# Patient Record
Sex: Female | Born: 1955
Health system: Southern US, Community
[De-identification: ages and names within clinical notes are randomized; demographics above are authoritative.]

## PROBLEM LIST (undated history)

## (undated) DIAGNOSIS — K219 Gastro-esophageal reflux disease without esophagitis: Secondary | ICD-10-CM

## (undated) DIAGNOSIS — E785 Hyperlipidemia, unspecified: Secondary | ICD-10-CM

## (undated) DIAGNOSIS — G2581 Restless legs syndrome: Secondary | ICD-10-CM

## (undated) DIAGNOSIS — R3915 Urgency of urination: Secondary | ICD-10-CM

## (undated) DIAGNOSIS — F419 Anxiety disorder, unspecified: Secondary | ICD-10-CM

## (undated) DIAGNOSIS — E781 Pure hyperglyceridemia: Secondary | ICD-10-CM

## (undated) DIAGNOSIS — I10 Essential (primary) hypertension: Secondary | ICD-10-CM

## (undated) DIAGNOSIS — J40 Bronchitis, not specified as acute or chronic: Secondary | ICD-10-CM

## (undated) DIAGNOSIS — J42 Unspecified chronic bronchitis: Secondary | ICD-10-CM

## (undated) DIAGNOSIS — F329 Major depressive disorder, single episode, unspecified: Secondary | ICD-10-CM

## (undated) DIAGNOSIS — E119 Type 2 diabetes mellitus without complications: Secondary | ICD-10-CM

## (undated) DIAGNOSIS — M1711 Unilateral primary osteoarthritis, right knee: Secondary | ICD-10-CM

## (undated) DIAGNOSIS — Z8043 Family history of malignant neoplasm of testis: Secondary | ICD-10-CM

## (undated) DIAGNOSIS — Z8481 Family history of carrier of genetic disease: Secondary | ICD-10-CM

## (undated) DIAGNOSIS — R32 Unspecified urinary incontinence: Secondary | ICD-10-CM

## (undated) DIAGNOSIS — M1712 Unilateral primary osteoarthritis, left knee: Secondary | ICD-10-CM

## (undated) DIAGNOSIS — F32A Depression, unspecified: Secondary | ICD-10-CM

## (undated) DIAGNOSIS — B009 Herpesviral infection, unspecified: Secondary | ICD-10-CM

## (undated) DIAGNOSIS — E669 Obesity, unspecified: Secondary | ICD-10-CM

## (undated) DIAGNOSIS — Z803 Family history of malignant neoplasm of breast: Secondary | ICD-10-CM

## (undated) DIAGNOSIS — Z806 Family history of leukemia: Secondary | ICD-10-CM

## (undated) DIAGNOSIS — G4730 Sleep apnea, unspecified: Secondary | ICD-10-CM

## (undated) DIAGNOSIS — M1990 Unspecified osteoarthritis, unspecified site: Secondary | ICD-10-CM

## (undated) DIAGNOSIS — Z808 Family history of malignant neoplasm of other organs or systems: Secondary | ICD-10-CM

## (undated) DIAGNOSIS — J189 Pneumonia, unspecified organism: Secondary | ICD-10-CM

## (undated) HISTORY — DX: Family history of carrier of genetic disease: Z84.81

## (undated) HISTORY — DX: Family history of malignant neoplasm of breast: Z80.3

## (undated) HISTORY — DX: Gastro-esophageal reflux disease without esophagitis: K21.9

## (undated) HISTORY — DX: Essential (primary) hypertension: I10

## (undated) HISTORY — PX: BREAST BIOPSY: SHX20

## (undated) HISTORY — DX: Hyperlipidemia, unspecified: E78.5

## (undated) HISTORY — DX: Family history of malignant neoplasm of other organs or systems: Z80.8

## (undated) HISTORY — DX: Unspecified urinary incontinence: R32

## (undated) HISTORY — DX: Herpesviral infection, unspecified: B00.9

## (undated) HISTORY — DX: Obesity, unspecified: E66.9

## (undated) HISTORY — DX: Anxiety disorder, unspecified: F41.9

## (undated) HISTORY — PX: REFRACTIVE SURGERY: SHX103

## (undated) HISTORY — PX: KNEE ARTHROSCOPY: SHX127

## (undated) HISTORY — DX: Pure hyperglyceridemia: E78.1

## (undated) HISTORY — DX: Family history of malignant neoplasm of testis: Z80.43

## (undated) HISTORY — DX: Restless legs syndrome: G25.81

## (undated) HISTORY — PX: TONSILLECTOMY: SUR1361

## (undated) HISTORY — DX: Unilateral primary osteoarthritis, left knee: M17.12

## (undated) HISTORY — PX: COLONOSCOPY: SHX5424

## (undated) HISTORY — PX: NASAL SEPTUM SURGERY: SHX37

## (undated) HISTORY — DX: Family history of leukemia: Z80.6

---

## 1993-06-14 HISTORY — PX: HYSTEROSCOPY: SHX211

## 1993-06-14 HISTORY — PX: VAGINAL HYSTERECTOMY: SUR661

## 1999-02-18 LAB — HM PAP SMEAR

## 1999-04-06 ENCOUNTER — Encounter: Payer: Self-pay | Admitting: Obstetrics and Gynecology

## 1999-04-06 ENCOUNTER — Ambulatory Visit (HOSPITAL_COMMUNITY): Admission: RE | Admit: 1999-04-06 | Discharge: 1999-04-06 | Payer: Self-pay | Admitting: Obstetrics and Gynecology

## 2002-01-23 ENCOUNTER — Ambulatory Visit (HOSPITAL_BASED_OUTPATIENT_CLINIC_OR_DEPARTMENT_OTHER): Admission: RE | Admit: 2002-01-23 | Discharge: 2002-01-23 | Payer: Self-pay | Admitting: Family Medicine

## 2002-03-21 ENCOUNTER — Ambulatory Visit (HOSPITAL_BASED_OUTPATIENT_CLINIC_OR_DEPARTMENT_OTHER): Admission: RE | Admit: 2002-03-21 | Discharge: 2002-03-21 | Payer: Self-pay | Admitting: *Deleted

## 2002-06-12 ENCOUNTER — Ambulatory Visit (HOSPITAL_COMMUNITY): Admission: RE | Admit: 2002-06-12 | Discharge: 2002-06-13 | Payer: Self-pay | Admitting: *Deleted

## 2002-06-12 ENCOUNTER — Encounter (INDEPENDENT_AMBULATORY_CARE_PROVIDER_SITE_OTHER): Payer: Self-pay | Admitting: Specialist

## 2002-08-14 HISTORY — PX: UVULOPALATOPHARYNGOPLASTY, TONSILLECTOMY AND SEPTOPLASTY: SHX2632

## 2005-04-11 ENCOUNTER — Ambulatory Visit: Payer: Self-pay | Admitting: Internal Medicine

## 2005-04-18 ENCOUNTER — Ambulatory Visit: Payer: Self-pay | Admitting: Internal Medicine

## 2007-07-19 ENCOUNTER — Encounter: Admission: RE | Admit: 2007-07-19 | Discharge: 2007-07-19 | Payer: Self-pay | Admitting: Family Medicine

## 2008-08-14 HISTORY — PX: KNEE ARTHROSCOPY W/ MENISCECTOMY: SHX1879

## 2010-09-04 ENCOUNTER — Encounter: Payer: Self-pay | Admitting: Family Medicine

## 2010-12-30 NOTE — Op Note (Signed)
NAMEBYRON, Diane Gregory                            ACCOUNT NO.:  [id number]   MEDICAL RECORD NO.:  [id number]                   PATIENT TYPE:  OIB   LOCATION:  NA                                   FACILITY:  [facility]   PHYSICIAN:  Veverly Fells. Arletha Grippe, M.D.             DATE OF BIRTH:  February 14, 1956   DATE OF PROCEDURE:  06/12/2002  DATE OF DISCHARGE:                                 OPERATIVE REPORT   PREOPERATIVE DIAGNOSES:  1. Obstructive sleep apnea.  2. Nasal airway obstruction.  3. Nasal septal deviation.  4. Inferior turbinate hypertrophy.  5. Tonsillar hypertrophy.  6. Elongated soft palate and uvula.   POSTOPERATIVE DIAGNOSIS:  1. Obstructive sleep apnea.  2. Nasal airway obstruction.  3. Nasal septal deviation.  4. Inferior turbinate hypertrophy.  5. Tonsillar hypertrophy.  6. Elongated soft palate and uvula.   OPERATION:  1. Tonsillectomy.  2. Uvulopalatopharyngoplasty (both using harmonic scalpel).  3. Nasal septal reconstruction.  4. Cauterization both inferior turbinates.   SURGEON:  Veverly Fells. Arletha Grippe, M.D.   ANESTHESIA:  General endotracheal   INDICATIONS FOR PROCEDURE:  This is a 55 year old white female who has a  history of nasal airway obstruction that is not relieved with topical nasal  steroid spray.  She does also have a history of day time somnolence, morning  fatigue, snoring at night with documented apneic episodes.  Physical  examination did show moderate to severe septal deviation right with boggy  inferior turbinates, 1 to 2+ enlarged tonsils and elongated soft palate and  uvula.  Inpatient polysomnogram obtained did show a mild obstructive sleep  apnea with an RDI of 12 with desaturations down to 93%.  Based on her  history and physical examination, I have recommended proceeding with the  above noted surgical procedure.  I discussed extensively with her and her  family the risks and benefits of surgery including risk of general  anesthesia, infection  and bleeding and the normal recovery period to expect  after this type of surgery, including the possibility of uvulopharyngeal  insufficiency and the need for septal splinting.  I have entertained their  questions and answered them appropriately. Informed consent has been  obtained.   OPERATIVE FINDINGS:  Septal deviation to the right inferior turbinate,  congestion hypertrophy, moderate tonsillar hypertrophy, elongated soft  palate and uvula.   DESCRIPTION OF PROCEDURE:  The patient was brought to the operating room and  placed in the supine position.  General endotracheal anesthesia was  administered via the anesthesiologist without complications.  The patient  was administered 600 mg of Clindamycin IV times one and 10 mg of Decadron IV  times one.  The head of the table was turned 90 degrees.  The patient's face  was draped in the standard fashion.  A Crowe-Davis mouth retractor was  inserted into her oral cavity.  This was used to retract the mouth open.  First attention was turned to the right tonsil.  A curved Alice clamp was  used and the tonsil was retracted medially.  Harmonic scalpel was then used  to dissect the tonsil free from the tonsillar fossa.  Bleeding was  controlled with a combination of Harmonic scalpel and suction cautery  without difficulty.  The tonsil was then transected at the tongue base and  removed through the oral cavity and sent to surgical pathology for a  permanent section dialysis.  Next, attention was turned to the left tonsil.  Identical procedure was carried out on this side compared to the right side  with identical results.  After this was done, the uvula portion of the soft  palpate was transected horizontally using the Harmonic scalpel creating an  inferiorly based trap door flap.  This was done to leave enough soft palate  to prevent uvulopharyngeal insufficiency.  After this was done, bleeding  from the area was controlled meticulously with  suction cautery without  difficulty.  The oral cavity was then irrigated with copious amounts of  irrigation fluid and suctioned dry.  There was no evidence of any active  bleeding.  The palate, cut edges and anterior and posterior tonsillar  pillars were reapproximated with multiple interrupted 3-0 Vicryl suture and  approximately 4 cc of 0.25% Marcaine solution with 1:200,000 epinephrine  infiltrated into the anterior tonsillar pillar and tongue base bilaterally.  After this was done, the Crowe-Davis mouth retractor was released and  brought out through the oral cavity.  The head of the bed was elevated  approximately 30 degrees.  Cotton pledgets soaked in a 4% cocaine solution  were placed in both nares, left in place for approximately five to ten  minutes and then removed.  Both sides of the septum were infiltrated with 1%  Lidocaine solution with 1:100,000 epinephrine.  After awaiting approximately  ten minutes, a standard incision was made on the left septum.  Mucoperichondrial and mucoperiosteal flap was elevated on the left side  using both blunt and sharp dissection.  An intercartilaginous incision was  made approximately 1 cm posterior from the caudal line of the septum.  Mucoperichondrium and mucoperiosteal flap was elevated on the right side  using both blunt and sharp dissection.  Cartilaginous deviation was removed.  Posterior bony reflection was removed with a Jansen-Middleton forceps.  Septal spur which was pushing off the right nasal chamber was also removed  using a Jansen-Middleton forceps without difficulty.  A piece of trimmed  morselized cartilage was placed in between the septal flaps, septal incision  was closed with interrupted chromic suture and septum was reinforced with a  running transeptal plain gut mattress stitch.  Both inferior turbinates were  injected with a total of 6 cc of a 1% Lidocaine solution with a 1:100,000 epinephrine.  Both inferior turbinates  were then intramurally cauterized  using the Elmed intramural cauterization unit setting at 12 watt setting,  three passes of both inferior turbinates were performed without difficulty  and then both inferior turbinates were then outfractured using gentle  lateral pressure with large nasal septum.  Splint soaked in a Bactroban  ointment solution were placed on either side of the septum, held in place  with a Prolene suture.  Posterior pharynx was suctioned dry.  There was no  evidence of any active bleeding.  Orogastric tube was placed, this was used  to decompress the stomach contents.  It was then removed.  Fluids given  during the procedure were  approximately 800 cc of crystalloid.  Estimated  blood loss was less than 50 cc.  There were no drains.  The above noted two  splints were placed.  Specimen sent was septum cartilage and bone for gross  pathology only and tonsils x 2 and uvula portion of the soft palate.  The  patient tolerated the procedure well without complications, was extubated in  the operating room and transferred to recovery in stable condition.  Sponge,  instrument and needle counts were correct at the end of the procedure.  Total duration of the procedure is approximately two hours.  The patient  will be admitted for overnight recovery.  Once she recovers well, she will  be sent home on June 13, 2002.  She will be sent home on Biaxin elixir  500 mg p.o. b.i.d. for ten days, Vioxx 50 mg tablets one tablet p.o. q.d.  x10 and Lortab elixir 250 cc with two refills 10 to 15 cc p.o. q.4h. p.r.n.  pain.  She is to have light activity.  No heavy lifting or nose blowing for  two weeks after surgery.  Both her and her family were given oral and  written  instructions.  They are to call for any problems with bleeding, fever,  vomiting, pain, reaction to medications or any other questions.  She will  follow up in the office for postoperative check on Thursday, June 19, 2002  at 1:25 p.m.                                                Veverly Fells. Arletha Grippe, M.D.    MDR/MEDQ  D:  06/12/2002  T:  06/12/2002  Job:  [id number]   cc:   Tammy R. Collins [place], M.D.   Marcelyn Bruins, MD [facility]

## 2013-07-31 ENCOUNTER — Encounter: Payer: Self-pay | Admitting: Physician Assistant

## 2013-07-31 ENCOUNTER — Other Ambulatory Visit: Payer: Self-pay | Admitting: Physician Assistant

## 2013-07-31 NOTE — H&P (Signed)
TOTAL KNEE ADMISSION H&P  Patient is being admitted for left total knee arthroplasty.  Subjective:  Chief Complaint:left knee pain.  HPI: Diane Gregory, 57 y.o. female, has a history of pain and functional disability in the left knee due to arthritis and has failed non-surgical conservative treatments for greater than 12 weeks to includeNSAID's and/or analgesics, corticosteriod injections, viscosupplementation injections, flexibility and strengthening excercises, supervised PT with diminished ADL's post treatment, use of assistive devices, weight reduction as appropriate and activity modification.  Onset of symptoms was gradual, starting 10 years ago with gradually worsening course since that time. The patient noted no past surgery on the left knee(s).  Patient currently rates pain in the left knee(s) at 10 out of 10 with activity. Patient has night pain, worsening of pain with activity and weight bearing, pain that interferes with activities of daily living, crepitus and joint swelling.  Patient has evidence of subchondral sclerosis, periarticular osteophytes and joint space narrowing by imaging studies.  There is no active infection.  There are no active problems to display for this patient.  Past Medical History  Diagnosis Date  . Diabetes mellitus type 2, controlled, without complications   . Hypertension   . Restless leg syndrome   . Urinary incontinence   . Anxiety   . GERD (gastroesophageal reflux disease)   . Left knee DJD   . High triglycerides   . Hyperlipidemia     Past Surgical History  Procedure Laterality Date  . Vaginal hysterectomy  1994  . Uvulopalatopharyngoplasty, tonsillectomy and septoplasty  2004  . Knee arthroscopy w/ meniscectomy Right 2010     (Not in a hospital admission) Allergies  Allergen Reactions  . Celebrex [Celecoxib]   . Penicillins      Current outpatient prescriptions:atorvastatin (LIPITOR) 10 MG tablet, Take 10 mg by mouth daily., Disp: , Rfl:  ;  diltiazem (CARDIZEM CD) 240 MG 24 hr capsule, Take 240 mg by mouth daily., Disp: , Rfl: ;  fenofibrate 160 MG tablet, Take 160 mg by mouth daily., Disp: , Rfl: ;  losartan-hydrochlorothiazide (HYZAAR) 100-25 MG per tablet, Take 1 tablet by mouth daily., Disp: , Rfl:  metFORMIN (GLUCOPHAGE) 500 MG tablet, Take 500 mg by mouth 2 (two) times daily with a meal., Disp: , Rfl: ;  Multiple Vitamin (MULTIVITAMIN WITH MINERALS) TABS tablet, Take 1 tablet by mouth daily., Disp: , Rfl: ;  omeprazole (PRILOSEC) 20 MG capsule, Take 20 mg by mouth daily., Disp: , Rfl: ;  PARoxetine (PAXIL) 40 MG tablet, Take 40 mg by mouth every morning., Disp: , Rfl:  pramipexole (MIRAPEX) 1 MG tablet, Take 1 mg by mouth at bedtime., Disp: , Rfl: ;  solifenacin (VESICARE) 10 MG tablet, Take 10 mg by mouth daily., Disp: , Rfl:     History  Substance Use Topics  . Smoking status: Never Smoker   . Smokeless tobacco: Not on file  . Alcohol Use: Yes     Comment: very little    Family History  Problem Relation Age of Onset  . Heart disease Mother   . Hypertension Mother   . Hypertension Brother   . Diabetes Mellitus II Father   . Stroke Mother   . Cancer - Other Mother   . Cancer Father      Review of Systems  Constitutional: Negative.   HENT: Negative.   Eyes: Negative.   Respiratory: Negative.   Cardiovascular: Negative.   Gastrointestinal: Negative.   Genitourinary: Negative.   Musculoskeletal: Positive for joint pain.  Bilateral knee pain left worse than right  Skin: Negative.   Neurological: Negative.   Endo/Heme/Allergies: Negative.   Psychiatric/Behavioral: Negative.     Objective:  Physical Exam  Constitutional: She is oriented to person, place, and time. She appears well-developed and well-nourished.  HENT:  Head: Normocephalic and atraumatic.  Mouth/Throat: Oropharynx is clear and moist.  Eyes: Conjunctivae and EOM are normal.  Neck: Normal range of motion.  Cardiovascular: Normal rate  and regular rhythm.  Exam reveals no gallop and no friction rub.   No murmur heard. Respiratory: Effort normal and breath sounds normal. No respiratory distress. She has no wheezes. She has no rales. She exhibits no tenderness.  Low 02 sat will recheck once done with prednisone and nail polish off  GI: Soft. Bowel sounds are normal.  Genitourinary:  Not pertinent to current symptomatology therefore not examined.  Musculoskeletal:  She is independently ambulatory with a moderately antalgic gait.  She has active range of motion -5 to 125 degrees bilaterally, 2+ crepitus bilaterally.  1+ synovitis bilaterally.  Medial joint line tenderness bilaterally.    Neurological: She is alert and oriented to person, place, and time.  Skin: Skin is warm and dry.  Psychiatric: She has a normal mood and affect. Her behavior is normal.    Vital signs in last 24 hours: Last recorded: 12/18 1100   BP: 115/72 Pulse: 82  Temp: 97.6 F (36.4 C)    Height: 5\' 3"  (1.6 m) SpO2: 91  Weight: 107.502 kg (237 lb)     Labs:   Estimated body mass index is 41.99 kg/(m^2) as calculated from the following:   Height as of this encounter: 5\' 3"  (1.6 m).   Weight as of this encounter: 107.502 kg (237 lb).   Imaging Review Plain radiographs demonstrate severe degenerative joint disease of the left knee(s). The overall alignment issignificant varus. The bone quality appears to be good for age and reported activity level.  Assessment/Plan:  End stage arthritis, left knee   The patient history, physical examination, clinical judgment of the provider and imaging studies are consistent with end stage degenerative joint disease of the left knee(s) and total knee arthroplasty is deemed medically necessary. The treatment options including medical management, injection therapy arthroscopy and arthroplasty were discussed at length. The risks and benefits of total knee arthroplasty were presented and reviewed. The risks due  to aseptic loosening, infection, stiffness, patella tracking problems, thromboembolic complications and other imponderables were discussed. The patient acknowledged the explanation, agreed to proceed with the plan and consent was signed. Patient is being admitted for inpatient treatment for surgery, pain control, PT, OT, prophylactic antibiotics, VTE prophylaxis, progressive ambulation and ADL's and discharge planning. The patient is planning to be discharged home with home health services and her daughter  Diane Gregory A. Gwinda Passe Physician Assistant [facility] [phone]  07/31/2013, 1:19 PM

## 2013-08-01 ENCOUNTER — Encounter (HOSPITAL_COMMUNITY): Payer: Self-pay | Admitting: Pharmacy Technician

## 2013-08-04 ENCOUNTER — Ambulatory Visit (HOSPITAL_COMMUNITY)
Admission: RE | Admit: 2013-08-04 | Discharge: 2013-08-04 | Disposition: A | Discharge status: Home / Self Care | Payer: 59 | Source: Ambulatory Visit | Attending: Physician Assistant | Admitting: Physician Assistant

## 2013-08-04 ENCOUNTER — Encounter (HOSPITAL_COMMUNITY)
Admission: RE | Admit: 2013-08-04 | Discharge: 2013-08-04 | Disposition: A | Discharge status: Home / Self Care | Payer: 59 | Source: Ambulatory Visit | Attending: Orthopedic Surgery | Admitting: Orthopedic Surgery

## 2013-08-04 ENCOUNTER — Encounter (HOSPITAL_COMMUNITY): Payer: Self-pay

## 2013-08-04 DIAGNOSIS — Z01818 Encounter for other preprocedural examination: Secondary | ICD-10-CM | POA: Insufficient documentation

## 2013-08-04 DIAGNOSIS — Z0183 Encounter for blood typing: Secondary | ICD-10-CM | POA: Insufficient documentation

## 2013-08-04 DIAGNOSIS — M1710 Unilateral primary osteoarthritis, unspecified knee: Secondary | ICD-10-CM | POA: Insufficient documentation

## 2013-08-04 DIAGNOSIS — I10 Essential (primary) hypertension: Secondary | ICD-10-CM | POA: Insufficient documentation

## 2013-08-04 DIAGNOSIS — Z01812 Encounter for preprocedural laboratory examination: Secondary | ICD-10-CM | POA: Insufficient documentation

## 2013-08-04 HISTORY — DX: Sleep apnea, unspecified: G47.30

## 2013-08-04 LAB — COMPREHENSIVE METABOLIC PANEL
ALT: 38 U/L — ABNORMAL HIGH (ref 0–35)
AST: 23 U/L (ref 0–37)
Albumin: 4.1 g/dL (ref 3.5–5.2)
Alkaline Phosphatase: 93 U/L (ref 39–117)
BUN: 22 mg/dL (ref 6–23)
CO2: 25 mEq/L (ref 19–32)
Calcium: 9.5 mg/dL (ref 8.4–10.5)
Chloride: 95 mEq/L — ABNORMAL LOW (ref 96–112)
Creatinine, Ser: 0.73 mg/dL (ref 0.50–1.10)
GFR calc Af Amer: >90 mL/min (ref >90)
GFR calc non Af Amer: >90 mL/min (ref >90)
Glucose, Bld: 234 mg/dL — ABNORMAL HIGH (ref 70–99)
Potassium: 3.7 mEq/L (ref 3.5–5.1)
Sodium: 135 mEq/L (ref 135–145)
Total Bilirubin: 0.3 mg/dL (ref 0.3–1.2)
Total Protein: 7.7 g/dL (ref 6.0–8.3)

## 2013-08-04 LAB — CBC WITH DIFFERENTIAL/PLATELET
Eosinophils Absolute: 0.1 10*3/uL (ref 0.0–0.7)
HCT: 42.9 % (ref 36.0–46.0)
Hemoglobin: 14.2 g/dL (ref 12.0–15.0)
Lymphocytes Relative: 20 % (ref 12–46)
Lymphs Abs: 3.1 10*3/uL (ref 0.7–4.0)
MCHC: 33.1 g/dL (ref 30.0–36.0)
Monocytes Absolute: 1 10*3/uL (ref 0.1–1.0)
Monocytes Relative: 7 % (ref 3–12)
Neutro Abs: 11.5 10*3/uL — ABNORMAL HIGH (ref 1.7–7.7)
Neutrophils Relative %: 73 % (ref 43–77)
RBC: 5.12 MIL/uL — ABNORMAL HIGH (ref 3.87–5.11)
RDW: 13.2 % (ref 11.5–15.5)

## 2013-08-04 LAB — TYPE AND SCREEN
ABO/RH(D): O POS
Antibody Screen: NEGATIVE

## 2013-08-04 LAB — URINALYSIS, ROUTINE W REFLEX MICROSCOPIC
Bilirubin Urine: NEGATIVE
Glucose, UA: 250 mg/dL — AB
Leukocytes, UA: NEGATIVE
Nitrite: NEGATIVE
Specific Gravity, Urine: 1.018 (ref 1.005–1.030)
Urobilinogen, UA: 0.2 mg/dL (ref 0.0–1.0)
pH: 5 (ref 5.0–8.0)

## 2013-08-04 LAB — APTT: aPTT: 27 seconds (ref 24–37)

## 2013-08-04 LAB — PROTIME-INR
INR: 0.97 (ref 0.00–1.49)
Prothrombin Time: 12.7 seconds (ref 11.6–15.2)

## 2013-08-04 LAB — ABO/RH: ABO/RH(D): O POS

## 2013-08-04 LAB — SURGICAL PCR SCREEN: MRSA, PCR: NEGATIVE

## 2013-08-04 NOTE — Progress Notes (Addendum)
Pt not here for interview called and did interview over the phone.  Pt states that she could be by around 2pm for lab work and test.   Denies having a card cath. States that she had an echo and stress test at [facility]. Request to be sent for results. PCP is Dr Herb Grays

## 2013-08-04 NOTE — Progress Notes (Signed)
Pt here for labs consent signed and info review with pt voices understanding.

## 2013-08-04 NOTE — Pre-Procedure Instructions (Signed)
Diane Gregory  08/04/2013   Your procedure is scheduled on:  Dec 29 at 900  Report to [facility]  2 * 3 at 0700 AM.  Call this number if you have problems the morning of surgery: [phone]   Remember:   Do not eat food or drink liquids after midnight.   Take these medicines the morning of surgery with A SIP OF WATER: Cardizem, Prilosec, Paxil  Stop taking Aspirin, Aleve, Ibuprofen, BC's, Goody's, Herbal medications, and Fish Oil   Do not wear jewelry, make-up or nail polish.  Do not wear lotions, powders, or perfumes. You may wear deodorant.  Do not shave 48 hours prior to surgery.  Do not bring valuables to the hospital.  [facility] is not responsible                  for any belongings or valuables.               Contacts, dentures or bridgework may not be worn into surgery.  Leave suitcase in the car. After surgery it may be brought to your room.  For patients admitted to the hospital, discharge time is determined by your                treatment team.               Patients discharged the day of surgery will not be allowed to drive  home.    Special Instructions: Shower using CHG 2 nights before surgery and the night before surgery.  If you shower the day of surgery use CHG.  Use special wash - you have one bottle of CHG for all showers.  You should use approximately 1/3 of the bottle for each shower.   Please read over the following fact sheets that you were given: Pain Booklet, Coughing and Deep Breathing, Blood Transfusion Information, MRSA Information and Surgical Site Infection Prevention

## 2013-08-05 ENCOUNTER — Other Ambulatory Visit: Payer: Self-pay | Admitting: Physician Assistant

## 2013-08-05 LAB — URINE CULTURE: Colony Count: 50000

## 2013-08-08 ENCOUNTER — Encounter (HOSPITAL_COMMUNITY)
Admission: RE | Admit: 2013-08-08 | Discharge: 2013-08-08 | Disposition: A | Discharge status: Home / Self Care | Payer: 59 | Source: Ambulatory Visit | Attending: Orthopedic Surgery | Admitting: Orthopedic Surgery

## 2013-08-08 LAB — CBC WITH DIFFERENTIAL/PLATELET
Basophils Relative: 0 % (ref 0–1)
Eosinophils Absolute: 0.2 10*3/uL (ref 0.0–0.7)
HCT: 39.8 % (ref 36.0–46.0)
Hemoglobin: 13.2 g/dL (ref 12.0–15.0)
MCH: 28 pg (ref 26.0–34.0)
MCHC: 33.2 g/dL (ref 30.0–36.0)
Monocytes Absolute: 1.1 10*3/uL — ABNORMAL HIGH (ref 0.1–1.0)
Monocytes Relative: 11 % (ref 3–12)
RDW: 13.4 % (ref 11.5–15.5)

## 2013-08-10 MED ORDER — CHLORHEXIDINE GLUCONATE 4 % EX LIQD: 60.0000 mL | Freq: Once | CUTANEOUS | Status: DC

## 2013-08-10 MED ORDER — VANCOMYCIN HCL 10 G IV SOLR
1500.0000 mg | INTRAVENOUS | Status: AC
  Administered 2013-08-11: 1500 mg via INTRAVENOUS
  Filled 2013-08-10 (×2): qty 1500

## 2013-08-10 MED ORDER — TRANEXAMIC ACID 100 MG/ML IV SOLN
1000.0000 mg | INTRAVENOUS | Status: AC
  Administered 2013-08-11: 1000 mg via INTRAVENOUS
  Filled 2013-08-10: qty 10

## 2013-08-10 MED ORDER — POVIDONE-IODINE 7.5 % EX SOLN
Freq: Once | CUTANEOUS | Status: DC
  Filled 2013-08-10: qty 118

## 2013-08-10 MED ORDER — LACTATED RINGERS IV SOLN
INTRAVENOUS | Status: DC
  Administered 2013-08-11: 08:00:00 via INTRAVENOUS

## 2013-08-11 ENCOUNTER — Inpatient Hospital Stay (HOSPITAL_COMMUNITY)
Admission: RE | Admit: 2013-08-11 | Discharge: 2013-08-12 | DRG: 470 | Disposition: A | Discharge status: Home Health | Payer: 59 | Source: Ambulatory Visit | Attending: Orthopedic Surgery | Admitting: Orthopedic Surgery

## 2013-08-11 ENCOUNTER — Encounter (HOSPITAL_COMMUNITY): Payer: 59 | Admitting: Anesthesiology

## 2013-08-11 ENCOUNTER — Ambulatory Visit (HOSPITAL_COMMUNITY): Payer: 59 | Admitting: Anesthesiology

## 2013-08-11 ENCOUNTER — Encounter (HOSPITAL_COMMUNITY)
Admission: RE | Disposition: A | Discharge status: Home Health | Payer: Self-pay | Source: Ambulatory Visit | Attending: Orthopedic Surgery

## 2013-08-11 ENCOUNTER — Encounter (HOSPITAL_COMMUNITY): Payer: Self-pay | Admitting: *Deleted

## 2013-08-11 DIAGNOSIS — I10 Essential (primary) hypertension: Secondary | ICD-10-CM | POA: Diagnosis present

## 2013-08-11 DIAGNOSIS — Z823 Family history of stroke: Secondary | ICD-10-CM

## 2013-08-11 DIAGNOSIS — M179 Osteoarthritis of knee, unspecified: Secondary | ICD-10-CM | POA: Diagnosis present

## 2013-08-11 DIAGNOSIS — Z889 Allergy status to unspecified drugs, medicaments and biological substances status: Secondary | ICD-10-CM

## 2013-08-11 DIAGNOSIS — Z809 Family history of malignant neoplasm, unspecified: Secondary | ICD-10-CM

## 2013-08-11 DIAGNOSIS — Z8249 Family history of ischemic heart disease and other diseases of the circulatory system: Secondary | ICD-10-CM

## 2013-08-11 DIAGNOSIS — K219 Gastro-esophageal reflux disease without esophagitis: Secondary | ICD-10-CM | POA: Diagnosis present

## 2013-08-11 DIAGNOSIS — Z01812 Encounter for preprocedural laboratory examination: Secondary | ICD-10-CM

## 2013-08-11 DIAGNOSIS — M1710 Unilateral primary osteoarthritis, unspecified knee: Principal | ICD-10-CM | POA: Diagnosis present

## 2013-08-11 DIAGNOSIS — G4730 Sleep apnea, unspecified: Secondary | ICD-10-CM | POA: Diagnosis present

## 2013-08-11 DIAGNOSIS — E781 Pure hyperglyceridemia: Secondary | ICD-10-CM | POA: Diagnosis present

## 2013-08-11 DIAGNOSIS — E785 Hyperlipidemia, unspecified: Secondary | ICD-10-CM | POA: Diagnosis present

## 2013-08-11 DIAGNOSIS — E119 Type 2 diabetes mellitus without complications: Secondary | ICD-10-CM | POA: Diagnosis present

## 2013-08-11 DIAGNOSIS — M1712 Unilateral primary osteoarthritis, left knee: Secondary | ICD-10-CM | POA: Diagnosis present

## 2013-08-11 DIAGNOSIS — Z79899 Other long term (current) drug therapy: Secondary | ICD-10-CM

## 2013-08-11 DIAGNOSIS — Z880 Allergy status to penicillin: Secondary | ICD-10-CM

## 2013-08-11 DIAGNOSIS — F411 Generalized anxiety disorder: Secondary | ICD-10-CM | POA: Diagnosis present

## 2013-08-11 DIAGNOSIS — G2581 Restless legs syndrome: Secondary | ICD-10-CM | POA: Diagnosis present

## 2013-08-11 DIAGNOSIS — Z833 Family history of diabetes mellitus: Secondary | ICD-10-CM

## 2013-08-11 HISTORY — PX: TOTAL KNEE ARTHROPLASTY: SHX125

## 2013-08-11 LAB — GLUCOSE, CAPILLARY
Glucose-Capillary: 154 mg/dL — ABNORMAL HIGH (ref 70–99)
Glucose-Capillary: 180 mg/dL — ABNORMAL HIGH (ref 70–99)

## 2013-08-11 SURGERY — ARTHROPLASTY, KNEE, TOTAL
Anesthesia: Regional | Site: Knee | Laterality: Left

## 2013-08-11 MED ORDER — BUPIVACAINE-EPINEPHRINE 0.25% -1:200000 IJ SOLN
INTRAMUSCULAR | Status: DC | PRN
  Administered 2013-08-11: 30 mL

## 2013-08-11 MED ORDER — MENTHOL 3 MG MT LOZG: 1.0000 | LOZENGE | OROMUCOSAL | Status: DC | PRN

## 2013-08-11 MED ORDER — ADULT MULTIVITAMIN W/MINERALS CH
1.0000 | ORAL_TABLET | Freq: Every day | ORAL | Status: DC
  Administered 2013-08-11 – 2013-08-12 (×2): 1 via ORAL
  Filled 2013-08-11 (×2): qty 1

## 2013-08-11 MED ORDER — DEXAMETHASONE SODIUM PHOSPHATE 10 MG/ML IJ SOLN
INTRAMUSCULAR | Status: DC | PRN
  Administered 2013-08-11: 10 mg via INTRAVENOUS

## 2013-08-11 MED ORDER — PRAMIPEXOLE DIHYDROCHLORIDE 1 MG PO TABS
1.0000 mg | ORAL_TABLET | Freq: Every day | ORAL | Status: DC
  Administered 2013-08-11: 1 mg via ORAL
  Filled 2013-08-11 (×2): qty 1

## 2013-08-11 MED ORDER — PANTOPRAZOLE SODIUM 40 MG PO TBEC
40.0000 mg | DELAYED_RELEASE_TABLET | Freq: Every day | ORAL | Status: DC
  Administered 2013-08-11: 40 mg via ORAL
  Filled 2013-08-11: qty 1

## 2013-08-11 MED ORDER — PHENOL 1.4 % MT LIQD: 1.0000 | OROMUCOSAL | Status: DC | PRN

## 2013-08-11 MED ORDER — INSULIN ASPART 100 UNIT/ML ~~LOC~~ SOLN
0.0000 [IU] | Freq: Three times a day (TID) | SUBCUTANEOUS | Status: DC
  Administered 2013-08-11: 3 [IU] via SUBCUTANEOUS
  Administered 2013-08-12 (×2): 5 [IU] via SUBCUTANEOUS

## 2013-08-11 MED ORDER — METOCLOPRAMIDE HCL 5 MG/ML IJ SOLN: 5.0000 mg | Freq: Three times a day (TID) | INTRAMUSCULAR | Status: DC | PRN

## 2013-08-11 MED ORDER — METOCLOPRAMIDE HCL 5 MG PO TABS
5.0000 mg | ORAL_TABLET | Freq: Three times a day (TID) | ORAL | Status: DC | PRN
  Filled 2013-08-11: qty 2

## 2013-08-11 MED ORDER — ONDANSETRON HCL 4 MG PO TABS: 4.0000 mg | ORAL_TABLET | Freq: Four times a day (QID) | ORAL | Status: DC | PRN

## 2013-08-11 MED ORDER — PROPOFOL 10 MG/ML IV BOLUS
INTRAVENOUS | Status: DC | PRN
  Administered 2013-08-11: 170 mg via INTRAVENOUS

## 2013-08-11 MED ORDER — ZOLPIDEM TARTRATE 5 MG PO TABS: 5.0000 mg | ORAL_TABLET | Freq: Every evening | ORAL | Status: DC | PRN

## 2013-08-11 MED ORDER — ACETAMINOPHEN 325 MG PO TABS: 650.0000 mg | ORAL_TABLET | Freq: Four times a day (QID) | ORAL | Status: DC | PRN

## 2013-08-11 MED ORDER — FENTANYL CITRATE 0.05 MG/ML IJ SOLN
INTRAMUSCULAR | Status: AC
  Administered 2013-08-11: 100 ug
  Filled 2013-08-11: qty 2

## 2013-08-11 MED ORDER — NEOSTIGMINE METHYLSULFATE 1 MG/ML IJ SOLN
INTRAMUSCULAR | Status: DC | PRN
  Administered 2013-08-11: 3 mg via INTRAVENOUS

## 2013-08-11 MED ORDER — DOCUSATE SODIUM 100 MG PO CAPS
100.0000 mg | ORAL_CAPSULE | Freq: Two times a day (BID) | ORAL | Status: DC
  Administered 2013-08-11 – 2013-08-12 (×2): 100 mg via ORAL
  Filled 2013-08-11 (×2): qty 1

## 2013-08-11 MED ORDER — MIDAZOLAM HCL 5 MG/ML IJ SOLN: 2.0000 mg | Freq: Once | INTRAMUSCULAR | Status: DC

## 2013-08-11 MED ORDER — ONDANSETRON HCL 4 MG/2ML IJ SOLN: 4.0000 mg | Freq: Four times a day (QID) | INTRAMUSCULAR | Status: DC | PRN

## 2013-08-11 MED ORDER — FENOFIBRATE 160 MG PO TABS
160.0000 mg | ORAL_TABLET | Freq: Every day | ORAL | Status: DC
  Administered 2013-08-11 – 2013-08-12 (×2): 160 mg via ORAL
  Filled 2013-08-11 (×2): qty 1

## 2013-08-11 MED ORDER — SODIUM CHLORIDE 0.9 % IR SOLN
Status: DC | PRN
  Administered 2013-08-11: 1000 mL

## 2013-08-11 MED ORDER — SODIUM CHLORIDE 0.9 % IR SOLN
Status: DC | PRN
  Administered 2013-08-11: 3000 mL

## 2013-08-11 MED ORDER — BUPIVACAINE-EPINEPHRINE (PF) 0.25% -1:200000 IJ SOLN
INTRAMUSCULAR | Status: AC
  Filled 2013-08-11: qty 30

## 2013-08-11 MED ORDER — KETOROLAC TROMETHAMINE 15 MG/ML IJ SOLN
15.0000 mg | Freq: Four times a day (QID) | INTRAMUSCULAR | Status: DC
  Administered 2013-08-11 – 2013-08-12 (×3): 15 mg via INTRAVENOUS
  Filled 2013-08-11 (×4): qty 1

## 2013-08-11 MED ORDER — HYDROMORPHONE HCL PF 1 MG/ML IJ SOLN
1.0000 mg | INTRAMUSCULAR | Status: DC | PRN
  Administered 2013-08-11: 1 mg via INTRAVENOUS
  Filled 2013-08-11: qty 1

## 2013-08-11 MED ORDER — DEXAMETHASONE 6 MG PO TABS
10.0000 mg | ORAL_TABLET | Freq: Three times a day (TID) | ORAL | Status: AC
  Administered 2013-08-11 – 2013-08-12 (×2): 10 mg via ORAL
  Filled 2013-08-11 (×3): qty 1

## 2013-08-11 MED ORDER — HYDROMORPHONE HCL PF 1 MG/ML IJ SOLN
0.2500 mg | INTRAMUSCULAR | Status: DC | PRN
  Administered 2013-08-11 (×2): 0.5 mg via INTRAVENOUS

## 2013-08-11 MED ORDER — PAROXETINE HCL 20 MG PO TABS
40.0000 mg | ORAL_TABLET | ORAL | Status: DC
  Administered 2013-08-12: 40 mg via ORAL
  Filled 2013-08-11 (×2): qty 2

## 2013-08-11 MED ORDER — DARIFENACIN HYDROBROMIDE ER 15 MG PO TB24
15.0000 mg | ORAL_TABLET | Freq: Every day | ORAL | Status: DC
  Administered 2013-08-11 – 2013-08-12 (×2): 15 mg via ORAL
  Filled 2013-08-11 (×2): qty 1

## 2013-08-11 MED ORDER — ATORVASTATIN CALCIUM 10 MG PO TABS
10.0000 mg | ORAL_TABLET | Freq: Every day | ORAL | Status: DC
  Administered 2013-08-11: 10 mg via ORAL
  Filled 2013-08-11 (×2): qty 1

## 2013-08-11 MED ORDER — LACTATED RINGERS IV SOLN
INTRAVENOUS | Status: DC | PRN
  Administered 2013-08-11 (×2): via INTRAVENOUS

## 2013-08-11 MED ORDER — FENTANYL CITRATE 0.05 MG/ML IJ SOLN
INTRAMUSCULAR | Status: DC | PRN
  Administered 2013-08-11 (×5): 50 ug via INTRAVENOUS

## 2013-08-11 MED ORDER — VANCOMYCIN HCL IN DEXTROSE 1-5 GM/200ML-% IV SOLN
1000.0000 mg | Freq: Two times a day (BID) | INTRAVENOUS | Status: AC
  Administered 2013-08-11: 1000 mg via INTRAVENOUS
  Filled 2013-08-11: qty 200

## 2013-08-11 MED ORDER — ACETAMINOPHEN 650 MG RE SUPP: 650.0000 mg | Freq: Four times a day (QID) | RECTAL | Status: DC | PRN

## 2013-08-11 MED ORDER — DIPHENHYDRAMINE HCL 12.5 MG/5ML PO ELIX: 12.5000 mg | ORAL_SOLUTION | ORAL | Status: DC | PRN

## 2013-08-11 MED ORDER — OXYCODONE HCL 5 MG PO TABS
5.0000 mg | ORAL_TABLET | ORAL | Status: DC | PRN
  Administered 2013-08-11: 10 mg via ORAL
  Administered 2013-08-11: 5 mg via ORAL
  Administered 2013-08-12: 10 mg via ORAL
  Filled 2013-08-11 (×2): qty 2

## 2013-08-11 MED ORDER — POTASSIUM CHLORIDE IN NACL 20-0.9 MEQ/L-% IV SOLN
INTRAVENOUS | Status: DC
  Administered 2013-08-11 – 2013-08-12 (×2): via INTRAVENOUS
  Filled 2013-08-11 (×5): qty 1000

## 2013-08-11 MED ORDER — ONDANSETRON HCL 4 MG/2ML IJ SOLN
INTRAMUSCULAR | Status: DC | PRN
  Administered 2013-08-11: 4 mg via INTRAVENOUS

## 2013-08-11 MED ORDER — GLYCOPYRROLATE 0.2 MG/ML IJ SOLN
INTRAMUSCULAR | Status: DC | PRN
  Administered 2013-08-11: 0.6 mg via INTRAVENOUS

## 2013-08-11 MED ORDER — ROCURONIUM BROMIDE 100 MG/10ML IV SOLN
INTRAVENOUS | Status: DC | PRN
  Administered 2013-08-11: 40 mg via INTRAVENOUS

## 2013-08-11 MED ORDER — INSULIN ASPART 100 UNIT/ML ~~LOC~~ SOLN
6.0000 [IU] | Freq: Three times a day (TID) | SUBCUTANEOUS | Status: DC
  Administered 2013-08-12: 6 [IU] via SUBCUTANEOUS
  Administered 2013-08-12: 09:00:00 via SUBCUTANEOUS

## 2013-08-11 MED ORDER — HYDROMORPHONE HCL PF 1 MG/ML IJ SOLN
INTRAMUSCULAR | Status: AC
  Administered 2013-08-11: 0.5 mg via INTRAVENOUS
  Filled 2013-08-11: qty 1

## 2013-08-11 MED ORDER — INSULIN ASPART 100 UNIT/ML ~~LOC~~ SOLN
0.0000 [IU] | Freq: Every day | SUBCUTANEOUS | Status: DC
  Administered 2013-08-11: 2 [IU] via SUBCUTANEOUS

## 2013-08-11 MED ORDER — MIDAZOLAM HCL 2 MG/2ML IJ SOLN
INTRAMUSCULAR | Status: AC
  Administered 2013-08-11: 2 mg
  Filled 2013-08-11: qty 2

## 2013-08-11 MED ORDER — LIDOCAINE HCL (CARDIAC) 20 MG/ML IV SOLN
INTRAVENOUS | Status: DC | PRN
  Administered 2013-08-11: 70 mg via INTRAVENOUS

## 2013-08-11 MED ORDER — LOSARTAN POTASSIUM-HCTZ 100-25 MG PO TABS: 1.0000 | ORAL_TABLET | Freq: Every day | ORAL | Status: DC

## 2013-08-11 MED ORDER — ASPIRIN EC 325 MG PO TBEC
325.0000 mg | DELAYED_RELEASE_TABLET | Freq: Every day | ORAL | Status: DC
  Administered 2013-08-12: 325 mg via ORAL
  Filled 2013-08-11 (×2): qty 1

## 2013-08-11 MED ORDER — HYDROMORPHONE HCL PF 1 MG/ML IJ SOLN: 0.2500 mg | INTRAMUSCULAR | Status: DC | PRN

## 2013-08-11 MED ORDER — ALUM & MAG HYDROXIDE-SIMETH 200-200-20 MG/5ML PO SUSP: 30.0000 mL | ORAL | Status: DC | PRN

## 2013-08-11 MED ORDER — FENTANYL CITRATE 0.05 MG/ML IJ SOLN: 100.0000 ug | Freq: Once | INTRAMUSCULAR | Status: DC

## 2013-08-11 MED ORDER — OXYCODONE HCL 5 MG PO TABS
ORAL_TABLET | ORAL | Status: AC
  Filled 2013-08-11: qty 1

## 2013-08-11 MED ORDER — DEXAMETHASONE SODIUM PHOSPHATE 10 MG/ML IJ SOLN
10.0000 mg | Freq: Three times a day (TID) | INTRAMUSCULAR | Status: AC
  Administered 2013-08-11: 10 mg via INTRAVENOUS
  Filled 2013-08-11 (×3): qty 1

## 2013-08-11 MED ORDER — BISACODYL 5 MG PO TBEC
10.0000 mg | DELAYED_RELEASE_TABLET | Freq: Every day | ORAL | Status: DC
  Administered 2013-08-11: 10 mg via ORAL
  Filled 2013-08-11: qty 2

## 2013-08-11 MED ORDER — HYDROCHLOROTHIAZIDE 25 MG PO TABS
25.0000 mg | ORAL_TABLET | Freq: Every day | ORAL | Status: DC
  Administered 2013-08-11 – 2013-08-12 (×2): 25 mg via ORAL
  Filled 2013-08-11 (×2): qty 1

## 2013-08-11 MED ORDER — LOSARTAN POTASSIUM 50 MG PO TABS
100.0000 mg | ORAL_TABLET | Freq: Every day | ORAL | Status: DC
  Administered 2013-08-11 – 2013-08-12 (×2): 100 mg via ORAL
  Filled 2013-08-11 (×2): qty 2

## 2013-08-11 MED ORDER — DILTIAZEM HCL ER COATED BEADS 240 MG PO CP24
240.0000 mg | ORAL_CAPSULE | Freq: Every day | ORAL | Status: DC
  Administered 2013-08-12: 240 mg via ORAL
  Filled 2013-08-11 (×2): qty 1

## 2013-08-11 SURGICAL SUPPLY — 66 items
BANDAGE ESMARK 6X9 LF (GAUZE/BANDAGES/DRESSINGS) ×1 IMPLANT
BLADE SAGITTAL 25.0X1.19X90 (BLADE) ×2 IMPLANT
BLADE SAW SGTL 11.0X1.19X90.0M (BLADE) IMPLANT
BLADE SAW SGTL 13.0X1.19X90.0M (BLADE) ×2 IMPLANT
BLADE SURG 10 STRL SS (BLADE) ×4 IMPLANT
BNDG ELASTIC 6X15 VLCR STRL LF (GAUZE/BANDAGES/DRESSINGS) ×2 IMPLANT
BNDG ESMARK 6X9 LF (GAUZE/BANDAGES/DRESSINGS) ×2
BOWL SMART MIX CTS (DISPOSABLE) ×2 IMPLANT
CAPT RP KNEE ×2 IMPLANT
CEMENT HV SMART SET (Cement) ×4 IMPLANT
CLOTH BEACON ORANGE TIMEOUT ST (SAFETY) IMPLANT
COVER SURGICAL LIGHT HANDLE (MISCELLANEOUS) ×2 IMPLANT
CUFF TOURNIQUET SINGLE 34IN LL (TOURNIQUET CUFF) ×2 IMPLANT
CUFF TOURNIQUET SINGLE 44IN (TOURNIQUET CUFF) IMPLANT
DRAPE EXTREMITY T 121X128X90 (DRAPE) ×2 IMPLANT
DRAPE INCISE IOBAN 66X45 STRL (DRAPES) IMPLANT
DRAPE PROXIMA HALF (DRAPES) ×2 IMPLANT
DRAPE U-SHAPE 47X51 STRL (DRAPES) ×2 IMPLANT
DRSG ADAPTIC 3X8 NADH LF (GAUZE/BANDAGES/DRESSINGS) ×2 IMPLANT
DRSG PAD ABDOMINAL 8X10 ST (GAUZE/BANDAGES/DRESSINGS) ×4 IMPLANT
DURAPREP 26ML APPLICATOR (WOUND CARE) ×2 IMPLANT
ELECT CAUTERY BLADE 6.4 (BLADE) ×2 IMPLANT
ELECT REM PT RETURN 9FT ADLT (ELECTROSURGICAL) ×2
ELECTRODE REM PT RTRN 9FT ADLT (ELECTROSURGICAL) ×1 IMPLANT
EVACUATOR 1/8 PVC DRAIN (DRAIN) ×2 IMPLANT
FACESHIELD LNG OPTICON STERILE (SAFETY) ×2 IMPLANT
GLOVE BIO SURGEON STRL SZ 6.5 (GLOVE) ×2 IMPLANT
GLOVE BIO SURGEON STRL SZ7 (GLOVE) ×2 IMPLANT
GLOVE BIOGEL PI IND STRL 6.5 (GLOVE) ×1 IMPLANT
GLOVE BIOGEL PI IND STRL 7.0 (GLOVE) ×1 IMPLANT
GLOVE BIOGEL PI IND STRL 7.5 (GLOVE) ×2 IMPLANT
GLOVE BIOGEL PI INDICATOR 6.5 (GLOVE) ×1
GLOVE BIOGEL PI INDICATOR 7.0 (GLOVE) ×1
GLOVE BIOGEL PI INDICATOR 7.5 (GLOVE) ×2
GLOVE SS BIOGEL STRL SZ 7.5 (GLOVE) ×2 IMPLANT
GLOVE SUPERSENSE BIOGEL SZ 7.5 (GLOVE) ×2
GOWN PREVENTION PLUS XLARGE (GOWN DISPOSABLE) ×4 IMPLANT
GOWN STRL NON-REIN LRG LVL3 (GOWN DISPOSABLE) ×4 IMPLANT
HANDPIECE INTERPULSE COAX TIP (DISPOSABLE) ×1
HOOD PEEL AWAY FACE SHEILD DIS (HOOD) ×4 IMPLANT
IMMOBILIZER KNEE 22 UNIV (SOFTGOODS) ×2 IMPLANT
KIT BASIN OR (CUSTOM PROCEDURE TRAY) ×2 IMPLANT
KIT ROOM TURNOVER OR (KITS) ×2 IMPLANT
MANIFOLD NEPTUNE II (INSTRUMENTS) ×2 IMPLANT
NS IRRIG 1000ML POUR BTL (IV SOLUTION) ×2 IMPLANT
PACK TOTAL JOINT (CUSTOM PROCEDURE TRAY) ×2 IMPLANT
PAD ARMBOARD 7.5X6 YLW CONV (MISCELLANEOUS) ×4 IMPLANT
PAD CAST 4YDX4 CTTN HI CHSV (CAST SUPPLIES) ×1 IMPLANT
PADDING CAST COTTON 4X4 STRL (CAST SUPPLIES) ×1
PADDING CAST COTTON 6X4 STRL (CAST SUPPLIES) ×2 IMPLANT
RUBBERBAND STERILE (MISCELLANEOUS) ×2 IMPLANT
SET HNDPC FAN SPRY TIP SCT (DISPOSABLE) ×1 IMPLANT
SPONGE GAUZE 4X4 12PLY (GAUZE/BANDAGES/DRESSINGS) ×2 IMPLANT
STRIP CLOSURE SKIN 1/2X4 (GAUZE/BANDAGES/DRESSINGS) ×2 IMPLANT
SUCTION FRAZIER TIP 10 FR DISP (SUCTIONS) ×2 IMPLANT
SUT ETHIBOND NAB CT1 #1 30IN (SUTURE) ×2 IMPLANT
SUT MNCRL AB 3-0 PS2 18 (SUTURE) ×2 IMPLANT
SUT VIC AB 0 CT1 27 (SUTURE) ×3
SUT VIC AB 0 CT1 27XBRD ANBCTR (SUTURE) ×3 IMPLANT
SUT VIC AB 2-0 CT1 27 (SUTURE) ×2
SUT VIC AB 2-0 CT1 TAPERPNT 27 (SUTURE) ×2 IMPLANT
SYR 30ML SLIP (SYRINGE) ×2 IMPLANT
TOWEL OR 17X24 6PK STRL BLUE (TOWEL DISPOSABLE) ×2 IMPLANT
TOWEL OR 17X26 10 PK STRL BLUE (TOWEL DISPOSABLE) ×2 IMPLANT
TRAY FOLEY CATH 16FR SILVER (SET/KITS/TRAYS/PACK) ×2 IMPLANT
WATER STERILE IRR 1000ML POUR (IV SOLUTION) IMPLANT

## 2013-08-11 NOTE — Op Note (Signed)
MRN:     [id number] DOB/AGE:    12-23-1955 / 57 y.o.       OPERATIVE REPORT    DATE OF PROCEDURE:  08/11/2013       PREOPERATIVE DIAGNOSIS:   DJD LEFT KNEE      There is no weight on file to calculate BMI.                                                        POSTOPERATIVE DIAGNOSIS:   DJD LEFT KNEE                                                                      PROCEDURE:  Procedure(s): LEFT TOTAL KNEE ARTHROPLASTY Using Depuy Sigma RP implants #2.5 Femur, #3Tibia, 12.2mm sigma RP bearing, 32 Patella     SURGEON: Issaiah Seabrooks A    ASSISTANT:  Kirstin Shepperson PA-C   (Present and scrubbed throughout the case, critical for assistance with exposure, retraction, instrumentation, and closure.)         ANESTHESIA: GET with Femoral Nerve Block  DRAINS: foley, 2 medium hemovac in knee   TOURNIQUET TIME:   COMPLICATIONS:  None     SPECIMENS: None   INDICATIONS FOR PROCEDURE: The patient has  DJD LEFT KNEE, varus deformities, XR shows bone on bone arthritis. Patient has failed all conservative measures including anti-inflammatory medicines, narcotics, attempts at  exercise and weight loss, cortisone injections and viscosupplementation.  Risks and benefits of surgery have been discussed, questions answered.   DESCRIPTION OF PROCEDURE: The patient identified by armband, received  right femoral nerve block and IV antibiotics, in the holding area at [facility]. Patient taken to the operating room, appropriate anesthetic  monitors were attached General endotracheal anesthesia induced with  the patient in supine position, Foley catheter was inserted. Tourniquet  applied high to the operative thigh. Lateral post and foot positioner  applied to the table, the lower extremity was then prepped and draped  in usual sterile fashion from the ankle to the tourniquet. Time-out procedure was performed. The limb was wrapped with an Esmarch bandage and the tourniquet inflated to 365  mmHg. We began the operation by making the anterior midline incision starting at handbreadth above the patella going over the patella 1 cm medial to and  4 cm distal to the tibial tubercle. Small bleeders in the skin and the  subcutaneous tissue identified and cauterized. Transverse retinaculum was incised and reflected medially and a medial parapatellar arthrotomy was accomplished. the patella was everted and theprepatellar fat pad resected. The superficial medial collateral  ligament was then elevated from anterior to posterior along the proximal  flare of the tibia and anterior half of the menisci resected. The knee was hyperflexed exposing bone on bone arthritis. Peripheral and notch osteophytes as well as the cruciate ligaments were then resected. We continued to  work our way around posteriorly along the proximal tibia, and externally  rotated the tibia subluxing it out from underneath the femur. A McHale  retractor was placed through the notch and a lateral [facility]  retractor  placed, and we then drilled through the proximal tibia in line with the  axis of the tibia followed by an intramedullary guide rod and 2-degree  posterior slope cutting guide. The tibial cutting guide was pinned into place  allowing resection of 4 mm of bone medially and about 6 mm of bone  laterally because of her varus deformity. Satisfied with the tibial resection, we then  entered the distal femur 2 mm anterior to the PCL origin with the  intramedullary guide rod and applied the distal femoral cutting guide  set at 11mm, with 5 degrees of valgus. This was pinned along the  epicondylar axis. At this point, the distal femoral cut was accomplished without difficulty. We then sized for a #2.5 femoral component and pinned the guide in 3 degrees of external rotation.The chamfer cutting guide was pinned into place. The anterior, posterior, and chamfer cuts were accomplished without difficulty followed by  the Sigma RP box  cutting guide and the box cut. We also removed posterior osteophytes from the posterior femoral condyles. At this  time, the knee was brought into full extension. We checked our  extension and flexion gaps and found them symmetric at 12.52mm.  The patella thickness measured at 20 mm. We set the cutting guide at 11 and removed the posterior 9.5-10 mm  of the patella sized for 32 button and drilled the lollipop. The knee  was then once again hyperflexed exposing the proximal tibia. We sized for a #3 tibial base plate, applied the smokestack and the conical reamer followed by the the Delta fin keel punch. We then hammered into place the Sigma RP trial femoral component, inserted a 12.5-mm trial bearing, trial patellar button, and took the knee through range of motion from 0-130 degrees. No thumb pressure was required for patellar  tracking. At this point, all trial components were removed, a double batch of DePuy HV cement  was mixed and applied to all bony metallic mating surfaces except for the posterior condyles of the femur itself. In order, we  hammered into place the tibial tray and removed excess cement, the femoral component and removed excess cement, a 12.5-mm Sigma RP bearing  was inserted, and the knee brought to full extension with compression.  The patellar button was clamped into place, and excess cement  removed. While the cement cured the wound was irrigated out with normal saline solution pulse lavage, and medium Hemovac drains were placed.. Ligament stability and patellar tracking were checked and found to be excellent. The tourniquet was then released and hemostasis was obtained with cautery. The parapatellar arthrotomy was closed with  #1 ethibond suture. The subcutaneous tissue with 0 and 2-0 undyed  Vicryl suture, and 4-0 Monocryl.. A dressing of Xeroform,  4 x 4, dressing sponges, Webril, and Ace wrap applied. Needle and sponge count were correct times 2.The patient awakened,  extubated, and taken to recovery room without difficulty. Vascular status was normal, pulses 2+ and symmetric.   Diane Gregory A 08/11/2013, 10:27 AM

## 2013-08-11 NOTE — Preoperative (Signed)
Beta Blockers   Reason not to administer Beta Blockers:Not Applicable 

## 2013-08-11 NOTE — Anesthesia Procedure Notes (Addendum)
Anesthesia Regional Block:  Adductor canal block  Pre-Anesthetic Checklist: ,, timeout performed, Correct Patient, Correct Site, Correct Laterality, Correct Procedure, Correct Position, site marked, Risks and benefits discussed,  Surgical consent,  Pre-op evaluation,  At surgeon's request and post-op pain management  Laterality: Left  Prep: chloraprep       Needles:   Needle Type: Stimulator Needle - 80          Additional Needles:  Procedures: Doppler guided and nerve stimulator Adductor canal block Narrative:  Start time: 08/11/2013 8:10 AM End time: 08/11/2013 8:20 AM Injection made incrementally with aspirations every 5 mL.  Performed by: Personally  Anesthesiologist: Dr. Randa Evens   Procedure Name: Intubation Date/Time: 08/11/2013 8:58 AM Performed by: Rogelia Boga Pre-anesthesia Checklist: Patient identified, Emergency Drugs available, Suction available, Patient being monitored and Timeout performed Patient Re-evaluated:Patient Re-evaluated prior to inductionOxygen Delivery Method: Circle system utilized Preoxygenation: Pre-oxygenation with 100% oxygen Intubation Type: IV induction Ventilation: Mask ventilation without difficulty Laryngoscope Size: Mac and 4 Grade View: Grade II Tube type: Oral Tube size: 7.5 mm Number of attempts: 1 Airway Equipment and Method: Stylet Placement Confirmation: ETT inserted through vocal cords under direct vision,  positive ETCO2 and breath sounds checked- equal and bilateral Secured at: 22 cm Tube secured with: Tape Dental Injury: Teeth and Oropharynx as per pre-operative assessment

## 2013-08-11 NOTE — Progress Notes (Signed)
Orthopedic Tech Progress Note Patient Details:  Diane Gregory [birth year] [id number] CPM applied to Left LE with appropriate settings. OHF applied to bed. Footsie roll provided. CPM Left Knee CPM Left Knee: On Left Knee Flexion (Degrees): 60 Left Knee Extension (Degrees): 0   [name] 08/11/2013, 11:58 AM

## 2013-08-11 NOTE — Anesthesia Postprocedure Evaluation (Signed)
  Anesthesia Post-op Note  Patient: Diane Gregory  Procedure(s) Performed: Procedure(s): LEFT TOTAL KNEE ARTHROPLASTY (Left)  Patient Location: PACU  Anesthesia Type:General  Level of Consciousness: awake  Airway and Oxygen Therapy: Patient Spontanous Breathing  Post-op Pain: mild  Post-op Assessment: Post-op Vital signs reviewed  Post-op Vital Signs: Reviewed  Complications: No apparent anesthesia complications

## 2013-08-11 NOTE — Interval H&P Note (Signed)
History and Physical Interval Note:  08/11/2013 8:41 AM  Diane Gregory  has presented today for surgery, with the diagnosis of DJD LEFT KNEE  The various methods of treatment have been discussed with the patient and family. After consideration of risks, benefits and other options for treatment, the patient has consented to  Procedure(s): LEFT TOTAL KNEE ARTHROPLASTY (Left) as a surgical intervention .  The patient's history has been reviewed, patient examined, no change in status, stable for surgery.  I have reviewed the patient's chart and labs.  Questions were answered to the patient's satisfaction.     Salvatore Marvel A

## 2013-08-11 NOTE — Progress Notes (Signed)
Report given to [facility] RN for lunch relief

## 2013-08-11 NOTE — Anesthesia Preprocedure Evaluation (Addendum)
Anesthesia Evaluation   Patient awake    Reviewed: Allergy & Precautions, H&P , NPO status , Patient's Chart, lab work & pertinent test results, reviewed documented beta blocker date and time   Airway Mallampati: II TM Distance: >3 FB Neck ROM: Full    Dental  (+) Teeth Intact and Dental Advisory Given   Pulmonary sleep apnea ,          Cardiovascular hypertension, Pt. on medications     Neuro/Psych    GI/Hepatic Neg liver ROS, GERD-  Medicated and Controlled,  Endo/Other  diabetes, Type 2, Oral Hypoglycemic Agents  Renal/GU negative Renal ROS     Musculoskeletal   Abdominal   Peds  Hematology   Anesthesia Other Findings   Reproductive/Obstetrics                          Anesthesia Physical Anesthesia Plan  ASA: III  Anesthesia Plan: General   Post-op Pain Management: MAC Combined w/ Regional for Post-op pain   Induction: Intravenous  Airway Management Planned: Oral ETT  Additional Equipment:   Intra-op Plan:   Post-operative Plan: Possible Post-op intubation/ventilation  Informed Consent:   Dental advisory given  Plan Discussed with: CRNA and Anesthesiologist  Anesthesia Plan Comments:         Anesthesia Quick Evaluation

## 2013-08-11 NOTE — Plan of Care (Signed)
Problem: Consults Goal: Diagnosis- Total Joint Replacement Primary Total Knee     

## 2013-08-11 NOTE — Transfer of Care (Signed)
Immediate Anesthesia Transfer of Care Note  Patient: Diane Gregory  Procedure(s) Performed: Procedure(s): LEFT TOTAL KNEE ARTHROPLASTY (Left)  Patient Location: PACU  Anesthesia Type:GA combined with regional for post-op pain  Level of Consciousness: awake, alert , oriented and patient cooperative  Airway & Oxygen Therapy: Patient Spontanous Breathing and Patient connected to face mask oxygen  Post-op Assessment: Report given to PACU RN, Post -op Vital signs reviewed and stable and Patient moving all extremities X 4  Post vital signs: Reviewed and stable  Complications: No apparent anesthesia complications

## 2013-08-11 NOTE — Progress Notes (Signed)
UR completed 

## 2013-08-11 NOTE — H&P (View-Only) (Signed)
TOTAL KNEE ADMISSION H&P  Patient is being admitted for left total knee arthroplasty.  Subjective:  Chief Complaint:left knee pain.  HPI: Diane Gregory, 57 y.o. female, has a history of pain and functional disability in the left knee due to arthritis and has failed non-surgical conservative treatments for greater than 12 weeks to includeNSAID's and/or analgesics, corticosteriod injections, viscosupplementation injections, flexibility and strengthening excercises, supervised PT with diminished ADL's post treatment, use of assistive devices, weight reduction as appropriate and activity modification.  Onset of symptoms was gradual, starting 10 years ago with gradually worsening course since that time. The patient noted no past surgery on the left knee(s).  Patient currently rates pain in the left knee(s) at 10 out of 10 with activity. Patient has night pain, worsening of pain with activity and weight bearing, pain that interferes with activities of daily living, crepitus and joint swelling.  Patient has evidence of subchondral sclerosis, periarticular osteophytes and joint space narrowing by imaging studies.  There is no active infection.  There are no active problems to display for this patient.  Past Medical History  Diagnosis Date  . Diabetes mellitus type 2, controlled, without complications   . Hypertension   . Restless leg syndrome   . Urinary incontinence   . Anxiety   . GERD (gastroesophageal reflux disease)   . Left knee DJD   . High triglycerides   . Hyperlipidemia     Past Surgical History  Procedure Laterality Date  . Vaginal hysterectomy  1994  . Uvulopalatopharyngoplasty, tonsillectomy and septoplasty  2004  . Knee arthroscopy w/ meniscectomy Right 2010     (Not in a hospital admission) Allergies  Allergen Reactions  . Celebrex [Celecoxib]   . Penicillins      Current outpatient prescriptions:atorvastatin (LIPITOR) 10 MG tablet, Take 10 mg by mouth daily., Disp: , Rfl:  ;  diltiazem (CARDIZEM CD) 240 MG 24 hr capsule, Take 240 mg by mouth daily., Disp: , Rfl: ;  fenofibrate 160 MG tablet, Take 160 mg by mouth daily., Disp: , Rfl: ;  losartan-hydrochlorothiazide (HYZAAR) 100-25 MG per tablet, Take 1 tablet by mouth daily., Disp: , Rfl:  metFORMIN (GLUCOPHAGE) 500 MG tablet, Take 500 mg by mouth 2 (two) times daily with a meal., Disp: , Rfl: ;  Multiple Vitamin (MULTIVITAMIN WITH MINERALS) TABS tablet, Take 1 tablet by mouth daily., Disp: , Rfl: ;  omeprazole (PRILOSEC) 20 MG capsule, Take 20 mg by mouth daily., Disp: , Rfl: ;  PARoxetine (PAXIL) 40 MG tablet, Take 40 mg by mouth every morning., Disp: , Rfl:  pramipexole (MIRAPEX) 1 MG tablet, Take 1 mg by mouth at bedtime., Disp: , Rfl: ;  solifenacin (VESICARE) 10 MG tablet, Take 10 mg by mouth daily., Disp: , Rfl:     History  Substance Use Topics  . Smoking status: Never Smoker   . Smokeless tobacco: Not on file  . Alcohol Use: Yes     Comment: very little    Family History  Problem Relation Age of Onset  . Heart disease Mother   . Hypertension Mother   . Hypertension Brother   . Diabetes Mellitus II Father   . Stroke Mother   . Cancer - Other Mother   . Cancer Father      Review of Systems  Constitutional: Negative.   HENT: Negative.   Eyes: Negative.   Respiratory: Negative.   Cardiovascular: Negative.   Gastrointestinal: Negative.   Genitourinary: Negative.   Musculoskeletal: Positive for joint pain.         Bilateral knee pain left worse than right  Skin: Negative.   Neurological: Negative.   Endo/Heme/Allergies: Negative.   Psychiatric/Behavioral: Negative.     Objective:  Physical Exam  Constitutional: She is oriented to person, place, and time. She appears well-developed and well-nourished.  HENT:  Head: Normocephalic and atraumatic.  Mouth/Throat: Oropharynx is clear and moist.  Eyes: Conjunctivae and EOM are normal.  Neck: Normal range of motion.  Cardiovascular: Normal rate  and regular rhythm.  Exam reveals no gallop and no friction rub.   No murmur heard. Respiratory: Effort normal and breath sounds normal. No respiratory distress. She has no wheezes. She has no rales. She exhibits no tenderness.  Low 02 sat will recheck once done with prednisone and nail polish off  GI: Soft. Bowel sounds are normal.  Genitourinary:  Not pertinent to current symptomatology therefore not examined.  Musculoskeletal:  She is independently ambulatory with a moderately antalgic gait.  She has active range of motion -5 to 125 degrees bilaterally, 2+ crepitus bilaterally.  1+ synovitis bilaterally.  Medial joint line tenderness bilaterally.    Neurological: She is alert and oriented to person, place, and time.  Skin: Skin is warm and dry.  Psychiatric: She has a normal mood and affect. Her behavior is normal.    Vital signs in last 24 hours: Last recorded: 12/18 1100   BP: 115/72 Pulse: 82  Temp: 97.6 F (36.4 C)    Height: 5' 3" (1.6 m) SpO2: 91  Weight: 107.502 kg (237 lb)     Labs:   Estimated body mass index is 41.99 kg/(m^2) as calculated from the following:   Height as of this encounter: 5' 3" (1.6 m).   Weight as of this encounter: 107.502 kg (237 lb).   Imaging Review Plain radiographs demonstrate severe degenerative joint disease of the left knee(s). The overall alignment issignificant varus. The bone quality appears to be good for age and reported activity level.  Assessment/Plan:  End stage arthritis, left knee   The patient history, physical examination, clinical judgment of the provider and imaging studies are consistent with end stage degenerative joint disease of the left knee(s) and total knee arthroplasty is deemed medically necessary. The treatment options including medical management, injection therapy arthroscopy and arthroplasty were discussed at length. The risks and benefits of total knee arthroplasty were presented and reviewed. The risks due  to aseptic loosening, infection, stiffness, patella tracking problems, thromboembolic complications and other imponderables were discussed. The patient acknowledged the explanation, agreed to proceed with the plan and consent was signed. Patient is being admitted for inpatient treatment for surgery, pain control, PT, OT, prophylactic antibiotics, VTE prophylaxis, progressive ambulation and ADL's and discharge planning. The patient is planning to be discharged home with home health services and her daughter  Ledon Weihe A. Edgardo Petrenko, PA-C Physician Assistant [facility] [phone]  07/31/2013, 1:19 PM   

## 2013-08-12 ENCOUNTER — Encounter (HOSPITAL_COMMUNITY): Payer: Self-pay

## 2013-08-12 LAB — CBC
HCT: 32.8 % — ABNORMAL LOW (ref 36.0–46.0)
MCH: 28 pg (ref 26.0–34.0)
MCHC: 33.8 g/dL (ref 30.0–36.0)
MCV: 82.6 fL (ref 78.0–100.0)
Platelets: 241 10*3/uL (ref 150–400)
RDW: 13.5 % (ref 11.5–15.5)

## 2013-08-12 LAB — BASIC METABOLIC PANEL
BUN: 13 mg/dL (ref 6–23)
Calcium: 8.8 mg/dL (ref 8.4–10.5)
Creatinine, Ser: 0.56 mg/dL (ref 0.50–1.10)
GFR calc Af Amer: >90 mL/min (ref >90)
Glucose, Bld: 236 mg/dL — ABNORMAL HIGH (ref 70–99)
Potassium: 4 mEq/L (ref 3.7–5.3)

## 2013-08-12 LAB — GLUCOSE, CAPILLARY: Glucose-Capillary: 238 mg/dL — ABNORMAL HIGH (ref 70–99)

## 2013-08-12 MED ORDER — BISACODYL 5 MG PO TBEC: DELAYED_RELEASE_TABLET | ORAL | Status: DC

## 2013-08-12 MED ORDER — DSS 100 MG PO CAPS: ORAL_CAPSULE | ORAL | Status: DC

## 2013-08-12 MED ORDER — OXYCODONE HCL 5 MG PO TABS: ORAL_TABLET | ORAL | Status: DC

## 2013-08-12 MED ORDER — ASPIRIN 325 MG PO TBEC: DELAYED_RELEASE_TABLET | ORAL | Status: DC

## 2013-08-12 NOTE — Progress Notes (Signed)
OT Evaluation/Screen Note  Patient Details Name: Diane Gregory MRN: [id number] DOB: 1956-01-10   Patient screened by Occupational Therapy with no further acute OT needs identified. All education has been completed and the patient has no further questions.  OT is signing off. Thank you for this referral.    Shalev Helminiak 08/12/2013, 11:44 AM

## 2013-08-12 NOTE — Evaluation (Signed)
Physical Therapy Evaluation Patient Details Name: Diane Gregory MRN: [id number] DOB: Dec 25, 1955 Today's Date: 08/12/2013 Time: 1191-4782 PT Time Calculation (min): 25 min  PT Assessment / Plan / Recommendation History of Present Illness  Pt is a 57 y/o female admitted s/p L TKA.   Clinical Impression  This patient presents with acute pain and decreased functional independence following the above mentioned procedure. At the time of PT eval, pt was able to perform functional mobility and ambulation with min guard and little reported pain. HEP given and discussed with pt. This patient is appropriate for skilled PT interventions to address functional limitations, improve safety and independence with functional mobility, and return to PLOF.     PT Assessment  Patient needs continued PT services    Follow Up Recommendations  Home health PT    Does the patient have the potential to tolerate intense rehabilitation      Barriers to Discharge        Equipment Recommendations  None recommended by PT    Recommendations for Other Services     Frequency 7X/week    Precautions / Restrictions Precautions Precautions: Fall;Knee Precaution Booklet Issued: No Precaution Comments: Discussed towel roll under ankle and no pillow under knee Restrictions Weight Bearing Restrictions: Yes LLE Weight Bearing: Weight bearing as tolerated   Pertinent Vitals/Pain 2/10 throughout session.       Mobility  Bed Mobility Bed Mobility: Not assessed Details for Bed Mobility Assistance: Pt received sitting up in chair.  Transfers Transfers: Sit to Stand;Stand to Sit Sit to Stand: 4: Min guard;From chair/3-in-1;With upper extremity assist Stand to Sit: 4: Min guard;To chair/3-in-1;With upper extremity assist Details for Transfer Assistance: VC's for hand placement on seated surface for safety.  Ambulation/Gait Ambulation/Gait Assistance: 4: Min guard Ambulation Distance (Feet): 100 Feet Assistive  device: Rolling walker Ambulation/Gait Assistance Details: VC's for sequencing and safety awareness with the RW. Pt able to achieve step-through gait pattern and was cued for quad activation and increased heel strike.  Gait Pattern: Step-through pattern;Decreased stride length;Trunk flexed Gait velocity: Decreased Stairs: No Wheelchair Mobility Wheelchair Mobility: No    Exercises Total Joint Exercises Ankle Circles/Pumps: 10 reps Quad Sets: 10 reps Heel Slides: 10 reps Long Arc Quad: 10 reps Goniometric ROM: 5-80 AAROM   PT Diagnosis: Difficulty walking;Acute pain  PT Problem List: Decreased strength;Decreased range of motion;Decreased activity tolerance;Decreased balance;Decreased mobility;Decreased knowledge of use of DME;Decreased safety awareness;Decreased knowledge of precautions;Pain PT Treatment Interventions: DME instruction;Gait training;Stair training;Functional mobility training;Therapeutic activities;Therapeutic exercise;Neuromuscular re-education;Patient/family education     PT Goals(Current goals can be found in the care plan section) Acute Rehab PT Goals Patient Stated Goal: To return to PLOF PT Goal Formulation: With patient Time For Goal Achievement: 08/19/13 Potential to Achieve Goals: Good  Visit Information  Last PT Received On: 08/12/13 Assistance Needed: +1 History of Present Illness: Pt is a 57 y/o female admitted s/p L TKA.        Prior Functioning  Home Living Family/patient expects to be discharged to:: Private residence Living Arrangements: Alone;Children (Daugher and husband will be there for 2 weeks after surgery) Available Help at Discharge: Family;Available 24 hours/day Type of Home: House Home Access: Level entry Home Layout: One level Home Equipment: Bedside commode;Walker - 2 wheels Prior Function Level of Independence: Independent Communication Communication: No difficulties Dominant Hand: Right    Cognition   Cognition Arousal/Alertness: Awake/alert Behavior During Therapy: WFL for tasks assessed/performed Overall Cognitive Status: Within Functional Limits for tasks assessed  Extremity/Trunk Assessment Upper Extremity Assessment Upper Extremity Assessment: Defer to OT evaluation Lower Extremity Assessment Lower Extremity Assessment: LLE deficits/detail LLE Deficits / Details: Decreased strength and AROM consistent with L TKA LLE: Unable to fully assess due to pain Cervical / Trunk Assessment Cervical / Trunk Assessment: Normal   Balance    End of Session PT - End of Session Equipment Utilized During Treatment: Gait belt Activity Tolerance: Patient tolerated treatment well Patient left: in chair;with call bell/phone within reach;with nursing/sitter in room Nurse Communication: Mobility status CPM Left Knee CPM Left Knee: Off  GP     Ruthann Cancer 08/12/2013, 12:23 PM  Ruthann Cancer, PT, DPT [phone]

## 2013-08-12 NOTE — Progress Notes (Signed)
Physical Therapy Treatment Patient Details Name: Diane Gregory MRN: [id number] DOB: 08/14/1956 Today's Date: 08/12/2013 Time: 4098-1191 PT Time Calculation (min): 12 min  PT Assessment / Plan / Recommendation  History of Present Illness Pt is a 57 y/o female admitted s/p L TKA.    PT Comments   Pt progressing well towards physical therapy goals. Discussed HEP and car transfer. Pt has no further questions or concerns in regards to returning home with family.   Follow Up Recommendations  Home health PT     Does the patient have the potential to tolerate intense rehabilitation     Barriers to Discharge        Equipment Recommendations  None recommended by PT    Recommendations for Other Services    Frequency 7X/week   Progress towards PT Goals Progress towards PT goals: Progressing toward goals  Plan      Precautions / Restrictions Precautions Precautions: Fall;Knee Precaution Booklet Issued: No Precaution Comments: Discussed towel roll under ankle and no pillow under knee Restrictions Weight Bearing Restrictions: Yes LLE Weight Bearing: Weight bearing as tolerated   Pertinent Vitals/Pain 2/10 during activity.     Mobility  Bed Mobility Bed Mobility: Not assessed Details for Bed Mobility Assistance: Pt received sitting up in chair.  Transfers Transfers: Sit to Stand;Stand to Sit Sit to Stand: 4: Min guard;From chair/3-in-1;With upper extremity assist Stand to Sit: 4: Min guard;To chair/3-in-1;With upper extremity assist Details for Transfer Assistance: Pt demonstrated proper hand placement and safety awareness. Ambulation/Gait Ambulation/Gait Assistance: 4: Min guard Ambulation Distance (Feet): 125 Feet Assistive device: Rolling walker Ambulation/Gait Assistance Details: VC's for increased heel strike, quad activation,  Gait Pattern: Step-through pattern;Decreased stride length;Trunk flexed Gait velocity: Decreased Stairs: No Wheelchair Mobility Wheelchair  Mobility: No    Exercises Total Joint Exercises Ankle Circles/Pumps: 10 reps Quad Sets: 10 reps Towel Squeeze: 10 reps Heel Slides: 10 reps Hip ABduction/ADduction: 10 reps Long Arc Quad: 10 reps Goniometric ROM: 5-80 AAROM   PT Diagnosis: Difficulty walking;Acute pain  PT Problem List: Decreased strength;Decreased range of motion;Decreased activity tolerance;Decreased balance;Decreased mobility;Decreased knowledge of use of DME;Decreased safety awareness;Decreased knowledge of precautions;Pain PT Treatment Interventions: DME instruction;Gait training;Stair training;Functional mobility training;Therapeutic activities;Therapeutic exercise;Neuromuscular re-education;Patient/family education   PT Goals (current goals can now be found in the care plan section) Acute Rehab PT Goals Patient Stated Goal: To return to PLOF PT Goal Formulation: With patient Time For Goal Achievement: 08/19/13 Potential to Achieve Goals: Good  Visit Information  Last PT Received On: 08/12/13 Assistance Needed: +1 History of Present Illness: Pt is a 57 y/o female admitted s/p L TKA.     Subjective Data  Subjective: "I feel good about going home." Patient Stated Goal: To return to PLOF   Cognition  Cognition Arousal/Alertness: Awake/alert Behavior During Therapy: WFL for tasks assessed/performed Overall Cognitive Status: Within Functional Limits for tasks assessed    Balance     End of Session PT - End of Session Equipment Utilized During Treatment: Gait belt Activity Tolerance: Patient tolerated treatment well Patient left: in chair;with call bell/phone within reach;with family/visitor present Nurse Communication: Mobility status CPM Left Knee CPM Left Knee: Off   GP     Ruthann Cancer 08/12/2013, 2:13 PM  Ruthann Cancer, PT, DPT [phone]

## 2013-08-12 NOTE — Care Management Note (Signed)
CARE MANAGEMENT NOTE 08/12/2013  Patient:  Diane Gregory, Diane Gregory   Account Number:  [id number]  Date Initiated:  08/12/2013  Documentation initiated by:  Vance Peper  Subjective/Objective Assessment:   57 yr old female s/p left total knee arthroplasty.     Action/Plan:   CM spoke with patient concerning home health and DME needs at discharge. Patient preoperatively setup with [facility], no changes. Patient has rolling walker, 3in1. CPM to be delivered.   Anticipated DC Date:  08/12/2013   Anticipated DC Plan:  HOME W HOME HEALTH SERVICES      DC Planning Services  CM consult      [facility] Choice  HOME HEALTH  DURABLE MEDICAL EQUIPMENT   Choice offered to / List presented to:  C-1 Patient   DME arranged  CPM      DME agency  [employer]     HH arranged  HH-2 PT      HH agency  [facility]   Status of service:  Completed, signed off Medicare Important Message given?   (If response is "NO", the following Medicare IM given date fields will be blank) Date Medicare IM given:   Date Additional Medicare IM given:    Discharge Disposition:  HOME W HOME HEALTH SERVICES

## 2013-08-12 NOTE — Discharge Summary (Signed)
Patient ID: Diane Gregory MRN: [id number] DOB/AGE: [birth year] 57 y.o.  Admit date: 08/11/2013 Discharge date: 08/12/2013  Admission Diagnoses:  Principal Problem:   Left knee DJD Active Problems:   Diabetes mellitus type 2, controlled, without complications   Hypertension   Restless leg syndrome   GERD (gastroesophageal reflux disease)   High triglycerides   Hyperlipidemia   Sleep apnea   DJD (degenerative joint disease) of knee   Discharge Diagnoses:  Same  Past Medical History  Diagnosis Date  . Diabetes mellitus type 2, controlled, without complications   . Hypertension   . Restless leg syndrome   . Urinary incontinence   . Anxiety   . GERD (gastroesophageal reflux disease)   . Left knee DJD   . High triglycerides   . Hyperlipidemia   . Sleep apnea     Surgeries: Procedure(s): LEFT TOTAL KNEE ARTHROPLASTY on 08/11/2013   Consultants:    Discharged Condition: Improved  Hospital Course: Diane Gregory is an 57 y.o. female who was admitted 08/11/2013 for operative treatment ofLeft knee DJD. Patient has severe unremitting pain that affects sleep, daily activities, and work/hobbies. After pre-op clearance the patient was taken to the operating room on 08/11/2013 and underwent  Procedure(s): LEFT TOTAL KNEE ARTHROPLASTY.    Patient was given perioperative antibiotics: Anti-infectives   Start     Dose/Rate Route Frequency Ordered Stop   08/11/13 1630  vancomycin (VANCOCIN) IVPB 1000 mg/200 mL premix     1,000 mg 200 mL/hr over 60 Minutes Intravenous Every 12 hours 08/11/13 1544 08/11/13 1829   08/11/13 0600  vancomycin (VANCOCIN) 1,500 mg in sodium chloride 0.9 % 500 mL IVPB     1,500 mg 250 mL/hr over 120 Minutes Intravenous On call to O.R. 08/10/13 1331 08/11/13 0930       Patient was given sequential compression devices, early ambulation, and chemoprophylaxis to prevent DVT.  Patient benefited maximally from hospital stay and there were no complications.     Recent vital signs: Patient Vitals for the past 24 hrs:  BP Temp Pulse Resp SpO2  08/12/13 0621 140/69 mmHg 98.6 F (37 C) 82 18 99 %  08/11/13 2137 145/68 mmHg 98.6 F (37 C) 85 18 99 %  08/11/13 1530 141/64 mmHg 98.4 F (36.9 C) 90 20 94 %  08/11/13 1510 - 98 F (36.7 C) - - -  08/11/13 1507 118/61 mmHg - 91 17 95 %  08/11/13 1452 124/54 mmHg - 90 16 94 %  08/11/13 1437 100/74 mmHg - 93 16 95 %  08/11/13 1422 108/51 mmHg - 92 17 93 %  08/11/13 1407 105/52 mmHg - 92 16 95 %  08/11/13 1352 103/52 mmHg - 88 16 92 %  08/11/13 1337 124/52 mmHg - 93 17 94 %  08/11/13 1322 119/92 mmHg - 91 16 94 %  08/11/13 1307 124/52 mmHg - 90 18 95 %  08/11/13 1253 114/56 mmHg - 86 14 94 %  08/11/13 1222 131/52 mmHg - 91 15 95 %  08/11/13 1220 - 98 F (36.7 C) - - -  08/11/13 1152 135/60 mmHg - 87 20 93 %  08/11/13 1150 - - 87 20 95 %  08/11/13 1137 138/57 mmHg - 89 17 96 %  08/11/13 1122 135/59 mmHg - 87 18 94 %  08/11/13 1107 156/57 mmHg - 92 18 93 %  08/11/13 1105 - 97.8 F (36.6 C) - - 94 %     Recent laboratory studies:  Recent Labs  08/12/13  0700  WBC 19.5*  HGB 11.1*  HCT 32.8*  PLT 241  NA 135*  K 4.0  CL 95*  CO2 24  BUN 13  CREATININE 0.56  GLUCOSE 236*  CALCIUM 8.8     Discharge Medications:     Medication List         aspirin 325 MG EC tablet  1 tab a day for the next 30 days to prevent blood clots     atorvastatin 10 MG tablet  Commonly known as:  LIPITOR  Take 10 mg by mouth daily.     bisacodyl 5 MG EC tablet  Commonly known as:  DULCOLAX  Take 2 tablets every night with dinner until bowel movement.  LAXITIVE.  Restart if two days since last bowel movement     diltiazem 240 MG 24 hr capsule  Commonly known as:  CARDIZEM CD  Take 240 mg by mouth daily.     DSS 100 MG Caps  1 tab 2 times a day while on narcotics.  STOOL SOFTENER     fenofibrate 160 MG tablet  Take 160 mg by mouth daily.     losartan-hydrochlorothiazide 100-25 MG per tablet   Commonly known as:  HYZAAR  Take 1 tablet by mouth daily.     metFORMIN 500 MG tablet  Commonly known as:  GLUCOPHAGE  Take 500 mg by mouth 2 (two) times daily with a meal.     multivitamin with minerals Tabs tablet  Take 1 tablet by mouth daily.     omeprazole 20 MG capsule  Commonly known as:  PRILOSEC  Take 20 mg by mouth daily.     oxyCODONE 5 MG immediate release tablet  Commonly known as:  Oxy IR/ROXICODONE  1-2 tablets every 4-6 hrs as needed for pain     PARoxetine 40 MG tablet  Commonly known as:  PAXIL  Take 40 mg by mouth every morning.     pramipexole 1 MG tablet  Commonly known as:  MIRAPEX  Take 1 mg by mouth at bedtime.     solifenacin 10 MG tablet  Commonly known as:  VESICARE  Take 10 mg by mouth daily.        Diagnostic Studies: Dg Chest 2 View  08/04/2013   CLINICAL DATA:  Hypertension.  EXAM: CHEST  2 VIEW  COMPARISON:  None.  FINDINGS: The heart size and mediastinal contours are within normal limits. Both lungs are clear. The visualized skeletal structures are unremarkable.  IMPRESSION: No active cardiopulmonary disease.   Electronically Signed   By: Maisie Fus  Register   On: 08/04/2013 16:03    Disposition:       Discharge Orders   Future Appointments Provider Department Dept Phone   10/15/2013 11:15 AM Forrestine Him Amundson de Gwenevere Ghazi, MD [facility] [phone]   Future Orders Complete By Expires   Call MD / Call 911  As directed    Comments:     If you experience chest pain or shortness of breath, CALL 911 and be transported to the hospital emergency room.  If you develope a fever above 101 F, pus (white drainage) or increased drainage or redness at the wound, or calf pain, call your surgeon's office.   Change dressing  As directed    Comments:     Change the dressing daily with sterile 4 x 4 inch gauze dressing and apply TED hose.  You may clean the incision with alcohol prior to redressing.  Constipation Prevention   As directed    Comments:     Drink plenty of fluids.  Prune juice may be helpful.  You may use a stool softener, such as Colace (over the counter) 100 mg twice a day.  Use MiraLax (over the counter) for constipation as needed.   CPM  As directed    Comments:     Continuous passive motion machine (CPM):      Use the CPM from 0 to 90 for 6 hours per day.       You may break it up into 2 or 3 sessions per day.      Use CPM for 2 weeks or until you are told to stop.   Diet - low sodium heart healthy  As directed    Discharge instructions  As directed    Comments:     Total Knee Replacement Care After Refer to this sheet in the next few weeks. These discharge instructions provide you with general information on caring for yourself after you leave the hospital. Your caregiver may also give you specific instructions. Your treatment has been planned according to the most current medical practices available, but unavoidable complications sometimes occur. If you have any problems or questions after discharge, please call your caregiver. Regaining a near full range of motion of your knee within the first 3 to 6 weeks after surgery is critical. HOME CARE INSTRUCTIONS  You may resume a normal diet and activities as directed.  Perform exercises as directed.  Place gray foam block, curve side up under heel at all times except when in CPM or when walking.  DO NOT modify, tear, cut, or change in any way the gray foam block. You will receive physical therapy daily  Take showers instead of baths until informed otherwise.  You may shower on Sunday.  Please wash whole leg including wound with soap and water  Change bandages (dressings)daily It is OK to take over-the-counter tylenol in addition to the oxycodone for pain, discomfort, or fever. Oxycodone is VERY constipating.  Please take stool softener twice a day and laxatives daily until bowels are regular Eat a well-balanced diet.  Avoid lifting or driving  until you are instructed otherwise.  Make an appointment to see your caregiver for stitches (suture) or staple removal as directed.  If you have been sent home with a continuous passive motion machine (CPM machine), 0-90 degrees 6 hrs a day   2 hrs a shift SEEK MEDICAL CARE IF: You have swelling of your calf or leg.  You develop shortness of breath or chest pain.  You have redness, swelling, or increasing pain in the wound.  There is pus or any unusual drainage coming from the surgical site.  You notice a bad smell coming from the surgical site or dressing.  The surgical site breaks open after sutures or staples have been removed.  There is persistent bleeding from the suture or staple line.  You are getting worse or are not improving.  You have any other questions or concerns.  SEEK IMMEDIATE MEDICAL CARE IF:  You have a fever.  You develop a rash.  You have difficulty breathing.  You develop any reaction or side effects to medicines given.  Your knee motion is decreasing rather than improving.  MAKE SURE YOU:  Understand these instructions.  Will watch your condition.  Will get help right away if you are not doing well or get worse.   Do not put a pillow  under the knee. Place it under the heel.  As directed    Comments:     Place gray foam block, curve side up under heel at all times except when in CPM or when walking.  DO NOT modify, tear, cut, or change in any way the gray foam block.   Increase activity slowly as tolerated  As directed    TED hose  As directed    Comments:     Use stockings (TED hose) for 2 weeks on both leg(s).  You may remove them at night for sleeping.      Follow-up Information   Follow up with Nilda Simmer, MD On 08/25/2013. (appt time 3 pm)    Specialty:  Orthopedic Surgery   Contact information:   [address] [place] [address] [phone]       Please follow up. (appt time 3pm)        Signed: Kayle Correa  J 08/12/2013, 9:20 AM

## 2013-09-09 LAB — HM MAMMOGRAPHY

## 2013-10-15 ENCOUNTER — Ambulatory Visit (INDEPENDENT_AMBULATORY_CARE_PROVIDER_SITE_OTHER): Payer: 59 | Admitting: Obstetrics and Gynecology

## 2013-10-15 ENCOUNTER — Ambulatory Visit: Payer: Self-pay | Admitting: Obstetrics and Gynecology

## 2013-10-15 ENCOUNTER — Encounter: Payer: Self-pay | Admitting: Obstetrics and Gynecology

## 2013-10-15 VITALS — BP 122/60 | HR 76 | Resp 14 | Ht 63.5 in | Wt 236.0 lb

## 2013-10-15 DIAGNOSIS — Z01419 Encounter for gynecological examination (general) (routine) without abnormal findings: Secondary | ICD-10-CM

## 2013-10-15 DIAGNOSIS — R32 Unspecified urinary incontinence: Secondary | ICD-10-CM

## 2013-10-15 MED ORDER — ACYCLOVIR 5 % EX CREA: 1.0000 "application " | TOPICAL_CREAM | CUTANEOUS | Status: DC

## 2013-10-15 NOTE — Progress Notes (Signed)
GYNECOLOGY VISIT  PCP: Florina Ou, MD  Referring provider:   HPI: 58 y.o.   Widowed  Caucasian  female   G2P2002 with No LMP recorded. Patient has had a hysterectomy.   here for   Annual Exam  Currently being treated for UTI.   Had left total knee replacement.  Planning on doing the right knee also.    Wants zovirax for cold sores.  Hgb:  PCP Urine:  PCP  GYNECOLOGIC HISTORY: No LMP recorded. Patient has had a hysterectomy. - fibroids. Ovaries retained.  Sexually active:  yes Partner preference: female Contraception:   TVH, Menopausal Menopausal hormone therapy: no DES exposure:   no Blood transfusions:   no Sexually transmitted diseases:  HSV 1 and HSV 2 GYN procedures and prior surgeries:  TVH  1994 Last mammogram:  09/2013 normal ([facility])               Last pap and high risk HPV testing:   02/18/99 WNL,   HRHPV Testing was not done History of abnormal pap smear:  no   OB History   Grav Para Term Preterm Abortions TAB SAB Ect Mult Living   2 2 2       2        LIFESTYLE: Exercise:      Cardio w/ Personal Trainer 2 days a week         Tobacco: no Alcohol: occ beer or wine Drug use:  no  OTHER HEALTH MAINTENANCE: Tetanus/TDap: less than 10 years (2008) Gardisil: no Influenza:  yes Zostavax: no  Bone density: 2007 normal Colonoscopy: 05/2005 normal, repeat in 10 years  Cholesterol check: every 3 months w/ PCP  Normal w. Medication (Lipitor)  Family History  Problem Relation Age of Onset  . Heart disease Mother   . Hypertension Mother   . Stroke Mother   . Breast cancer Mother   . Hypertension Brother   . Diabetes Mellitus II Father   . Cancer Father     CLL  . Breast cancer Paternal Grandmother     Patient Active Problem List   Diagnosis Date Noted  . DJD (degenerative joint disease) of knee 08/11/2013  . Diabetes mellitus type 2, controlled, without complications   . Hypertension   . Restless leg syndrome   . GERD (gastroesophageal reflux disease)    . High triglycerides   . Hyperlipidemia   . Sleep apnea   . Left knee DJD    Past Medical History  Diagnosis Date  . Diabetes mellitus type 2, controlled, without complications   . Hypertension   . Restless leg syndrome   . Urinary incontinence   . Anxiety   . GERD (gastroesophageal reflux disease)   . Left knee DJD   . High triglycerides   . Hyperlipidemia   . Sleep apnea     Past Surgical History  Procedure Laterality Date  . Vaginal hysterectomy  1994  . Uvulopalatopharyngoplasty, tonsillectomy and septoplasty  2004  . Knee arthroscopy w/ meniscectomy Right 2010  . Tonsillectomy    . Lasix eye    . Colonoscopy    . Total knee arthroplasty Left 08/11/2013    Procedure: LEFT TOTAL KNEE ARTHROPLASTY;  Surgeon: Lorn Junes, MD;  Location: [facility];  Service: Orthopedics;  Laterality: Left;  . Hysteroscopy  11/94    submuous myomas  . Abdominal hysterectomy  11/94    TVH    ALLERGIES: Celebrex and Penicillins  Current Outpatient Prescriptions  Medication Sig Dispense Refill  .  atorvastatin (LIPITOR) 10 MG tablet Take 10 mg by mouth daily.      . bisacodyl (DULCOLAX) 5 MG EC tablet Take 2 tablets every night with dinner until bowel movement.  LAXITIVE.  Restart if two days since last bowel movement  30 tablet  0  . ciprofloxacin (CIPRO) 500 MG tablet 2 (two) times daily.       Marland Kitchen diltiazem (CARDIZEM CD) 240 MG 24 hr capsule Take 240 mg by mouth daily.      . fenofibrate 160 MG tablet Take 145 mg by mouth daily.       Marland Kitchen losartan-hydrochlorothiazide (HYZAAR) 100-25 MG per tablet Take 1 tablet by mouth daily.      . metFORMIN (GLUCOPHAGE) 500 MG tablet Take 500 mg by mouth 2 (two) times daily with a meal.      . Multiple Vitamin (MULTIVITAMIN WITH MINERALS) TABS tablet Take 1 tablet by mouth daily.      Marland Kitchen omeprazole (PRILOSEC) 20 MG capsule Take 20 mg by mouth daily.      Marland Kitchen oxyCODONE (OXY IR/ROXICODONE) 5 MG immediate release tablet 1-2 tablets every 4-6 hrs as needed for  pain  100 tablet  0  . PARoxetine (PAXIL) 40 MG tablet Take 40 mg by mouth every morning.      . predniSONE (DELTASONE) 20 MG tablet       . solifenacin (VESICARE) 10 MG tablet Take 10 mg by mouth daily.      . valACYclovir (VALTREX) 1000 MG tablet Take 1,000 mg by mouth as needed.      Marland Kitchen aspirin EC 325 MG EC tablet 1 tab a day for the next 30 days to prevent blood clots  30 tablet  0  . pramipexole (MIRAPEX) 1 MG tablet Take 1 mg by mouth at bedtime.       No current facility-administered medications for this visit.     ROS:  Pertinent items are noted in HPI.  SOCIAL HISTORY:  Works in Therapist, art.   PHYSICAL EXAMINATION:    BP 122/60  Pulse 76  Resp 14  Ht 5' 3.5" (1.613 m)  Wt 236 lb (107.049 kg)  BMI 41.14 kg/m2   Wt Readings from Last 3 Encounters:  10/15/13 236 lb (107.049 kg)  08/04/13 239 lb (108.41 kg)  07/31/13 237 lb (107.502 kg)     Ht Readings from Last 3 Encounters:  10/15/13 5' 3.5" (1.613 m)  08/04/13 5' 3.5" (1.613 m)  07/31/13 5\' 3"  (1.6 m)    General appearance: alert, cooperative and appears stated age Head: Normocephalic, without obvious abnormality, atraumatic Neck: no adenopathy, supple, symmetrical, trachea midline and thyroid not enlarged, symmetric, no tenderness/mass/nodules Lungs: clear to auscultation bilaterally Breasts: Inspection negative, No nipple retraction or dimpling, No nipple discharge or bleeding, No axillary or supraclavicular adenopathy, Normal to palpation without dominant masses Heart: regular rate and rhythm Abdomen: soft, non-tender; no masses,  no organomegaly Extremities: extremities normal, atraumatic, no cyanosis or edema.  Left knee scar.  Skin: Skin color, texture, turgor normal. No rashes or lesions.   Lymph nodes: Cervical, supraclavicular, and axillary nodes normal. No abnormal inguinal nodes palpated Neurologic: Grossly normal  Pelvic: External genitalia:  no lesions              Urethra:  normal appearing  urethra with no masses, tenderness or lesions              Bartholins and Skenes: normal  Vagina: normal appearing vagina with normal color and discharge, no lesions              Cervix: absent.               Pap and high risk HPV testing done: no.            Bimanual Exam:  Uterus:   Absent.                                      Adnexa: normal adnexa in size, nontender and no masses                                      Rectovaginal: Confirms                                      Anus:  normal sphincter tone, no lesions  ASSESSMENT  Normal gynecologic exam. Status post TVH. Urinary incontinence. Oral HSV. Current UTI. In treatment.   PLAN  Mammogram recommended yearly.  Pap smear and high risk HPV testing not indicated. Counseled on self breast exam, exercise, and weight loss. Discussed referral to physical therapist for pelvic floor exercises.   Pt will consider.  Refill Zovirax ointment.  See Epic orders.  Return annually or prn   An After Visit Summary was printed and given to the patient.

## 2013-10-15 NOTE — Patient Instructions (Addendum)
EXERCISE AND DIET:  We recommended that you start or continue a regular exercise program for good health. Regular exercise means any activity that makes your heart beat faster and makes you sweat.  We recommend exercising at least 30 minutes per day at least 3 days a week, preferably 4 or 5.  We also recommend a diet low in fat and sugar.  Inactivity, poor dietary choices and obesity can cause diabetes, heart attack, stroke, and kidney damage, among others.    ALCOHOL AND SMOKING:  Women should limit their alcohol intake to no more than 7 drinks/beers/glasses of wine (combined, not each!) per week. Moderation of alcohol intake to this level decreases your risk of breast cancer and liver damage. And of course, no recreational drugs are part of a healthy lifestyle.  And absolutely no smoking or even second hand smoke. Most people know smoking can cause heart and lung diseases, but did you know it also contributes to weakening of your bones? Aging of your skin?  Yellowing of your teeth and nails?  CALCIUM AND VITAMIN D:  Adequate intake of calcium and Vitamin D are recommended.  The recommendations for exact amounts of these supplements seem to change often, but generally speaking 600 mg of calcium (either carbonate or citrate) and 800 units of Vitamin D per day seems prudent. Certain women may benefit from higher intake of Vitamin D.  If you are among these women, your doctor will have told you during your visit.    PAP SMEARS:  Pap smears, to check for cervical cancer or precancers,  have traditionally been done yearly, although recent scientific advances have shown that most women can have pap smears less often.  However, every woman still should have a physical exam from her gynecologist every year. It will include a breast check, inspection of the vulva and vagina to check for abnormal growths or skin changes, a visual exam of the cervix, and then an exam to evaluate the size and shape of the uterus and  ovaries.  And after 59 years of age, a rectal exam is indicated to check for rectal cancers. We will also provide age appropriate advice regarding health maintenance, like when you should have certain vaccines, screening for sexually transmitted diseases, bone density testing, colonoscopy, mammograms, etc.   MAMMOGRAMS:  All women over 40 years old should have a yearly mammogram. Many facilities now offer a "3D" mammogram, which may cost around $50 extra out of pocket. If possible,  we recommend you accept the option to have the 3D mammogram performed.  It both reduces the number of women who will be called back for extra views which then turn out to be normal, and it is better than the routine mammogram at detecting truly abnormal areas.    COLONOSCOPY:  Colonoscopy to screen for colon cancer is recommended for all women at age 50.  We know, you hate the idea of the prep.  We agree, BUT, having colon cancer and not knowing it is worse!!  Colon cancer so often starts as a polyp that can be seen and removed at colonscopy, which can quite literally save your life!  And if your first colonoscopy is normal and you have no family history of colon cancer, most women don't have to have it again for 10 years.  Once every ten years, you can do something that may end up saving your life, right?  We will be happy to help you get it scheduled when you are ready.    Be sure to check your insurance coverage so you understand how much it will cost.  It may be covered as a preventative service at no cost, but you should check your particular policy.     Urinary Incontinence Urinary incontinence is the involuntary loss of urine from your bladder. CAUSES  There are many causes of urinary incontinence. They include:  Medicines.  Infections.  Prostatic enlargement, leading to overflow of urine from your bladder.  Surgery.  Neurological diseases.  Emotional factors. SIGNS AND SYMPTOMS Urinary Incontinence can be  divided into four types: 1. Urge incontinence. Urge incontinence is the involuntary loss of urine before you have the opportunity to go to the bathroom. There is a sudden urge to void but not enough time to reach a bathroom. 2. Stress incontinence. Stress incontinence is the sudden loss of urine with any activity that forces urine to pass. It is commonly caused by anatomical changes to the pelvis and sphincter areas of your body. 3. Overflow incontinence. Overflow incontinence is the loss of urine from an obstructed opening to your bladder. This results in a backup of urine and a resultant buildup of pressure within the bladder. When the pressure within the bladder exceeds the closing pressure of the sphincter, the urine overflows, which causes incontinence, similar to water overflowing a dam. 4. Total incontinence. Total incontinence is the loss of urine as a result of the inability to store urine within your bladder. DIAGNOSIS  Evaluating the cause of incontinence may require:  A thorough and complete medical and obstetric history.  A complete physical exam.  Laboratory tests such as a urine culture and sensitivities. When additional tests are indicated, they can include:  An ultrasound exam.  Kidney and bladder X-rays.  Cystoscopy. This is an exam of the bladder using a narrow scope.  Urodynamic testing to test the nerve function to the bladder and sphincter areas. TREATMENT  Treatment for urinary incontinence depends on the cause:  For urge incontinence caused by a bacterial infection, antibiotics will be prescribed. If the urge incontinence is related to medicines you take, your health care provider may have you change the medicine.  For stress incontinence, surgery to re-establish anatomical support to the bladder or sphincter, or both, will often correct the condition.  For overflow incontinence caused by an enlarged prostate, an operation to open the channel through the enlarged  prostate will allow the flow of urine out of the bladder. In women with fibroids, a hysterectomy may be recommended.  For total incontinence, surgery on your urinary sphincter may help. An artificial urinary sphincter (an inflatable cuff placed around the urethra) may be required. In women who have developed a hole-like passage between their bladder and vagina (vesicovaginal fistula), surgery to close the fistula often is required. HOME CARE INSTRUCTIONS  Normal daily hygiene and the use of pads or adult diapers that are changed regularly will help prevent odors and skin damage.  Avoid caffeine. It can overstimulate your bladder.  Use the bathroom regularly. Try about every 2 3 hours to go to the bathroom, even if you do not feel the need to do so. Take time to empty your bladder completely. After urinating, wait a minute. Then try to urinate again.  For causes involving nerve dysfunction, keep a log of the medicines you take and a journal of the times you go to the bathroom. SEEK MEDICAL CARE IF:  You experience worsening of pain instead of improvement in pain after your procedure.  Your incontinence  becomes worse instead of better. SEE IMMEDIATE MEDICAL CARE IF:  You experience fever or shaking chills.  You are unable to pass your urine.  You have redness spreading into your groin or down into your thighs. MAKE SURE YOU:   Understand these instructions.   Will watch your condition.  Will get help right away if you are not doing well or get worse. Document Released: 09/07/2004 Document Revised: 05/21/2013 Document Reviewed: 01/07/2013 [facility] Patient Information 2014 [place].

## 2013-11-05 ENCOUNTER — Telehealth: Payer: Self-pay | Admitting: Emergency Medicine

## 2013-11-05 NOTE — Telephone Encounter (Signed)
Dr. Quincy Simmonds received denial letter from [facility] for prior authorization of Zovirax cream.   Alternative Therapies are: OTC Abreva, Famvir, Zovirax capsules, Valtrex tablets.   Patient failed Valtrex tablets and needs failure to one of the above as well.   Dr. Quincy Simmonds advised patient can try OTC Abreva apply 5 times daily until healed.   Spoke with patient and advised of message above. To call back if fails to Abreva and can try prior authorization again. Patient agreeable to plan.    Routing to provider for final review. Patient agreeable to disposition. Will close encounter

## 2014-04-22 ENCOUNTER — Encounter (HOSPITAL_COMMUNITY): Payer: Self-pay | Admitting: Physician Assistant

## 2014-04-22 ENCOUNTER — Encounter (HOSPITAL_COMMUNITY): Payer: Self-pay | Admitting: Pharmacy Technician

## 2014-04-22 DIAGNOSIS — Z96659 Presence of unspecified artificial knee joint: Secondary | ICD-10-CM

## 2014-04-22 DIAGNOSIS — M1711 Unilateral primary osteoarthritis, right knee: Secondary | ICD-10-CM | POA: Diagnosis present

## 2014-04-22 NOTE — H&P (Signed)
TOTAL KNEE ADMISSION H&P  Patient is being admitted for right total knee arthroplasty.  Subjective:  Chief Complaint:right knee pain.  HPI: Diane Gregory, 58 y.o. female, has a history of pain and functional disability in the right knee due to arthritis and has failed non-surgical conservative treatments for greater than 12 weeks to includeNSAID's and/or analgesics, corticosteriod injections, viscosupplementation injections, flexibility and strengthening excercises, supervised PT with diminished ADL's post treatment, use of assistive devices, weight reduction as appropriate and activity modification.  Onset of symptoms was gradual, starting 10 years ago with gradually worsening course since that time. The patient noted prior procedures on the knee to include  arthroscopy and menisectomy on the right knee(s).  Patient currently rates pain in the right knee(s) at 10 out of 10 with activity. Patient has night pain, worsening of pain with activity and weight bearing, pain that interferes with activities of daily living, crepitus and joint swelling.  Patient has evidence of subchondral sclerosis, periarticular osteophytes and joint space narrowing by imaging studies. There is no active infection.  Patient Active Problem List   Diagnosis Date Noted  . S/P left total knee replacement   . Right knee DJD   . Primary localized osteoarthritis of right knee   . DJD (degenerative joint disease) of knee 08/11/2013  . Diabetes mellitus type 2, controlled, without complications   . Hypertension   . Restless leg syndrome   . GERD (gastroesophageal reflux disease)   . High triglycerides   . Hyperlipidemia   . Sleep apnea   . Left knee DJD    Past Medical History  Diagnosis Date  . Diabetes mellitus type 2, controlled, without complications   . Hypertension   . Restless leg syndrome   . Urinary incontinence   . Anxiety   . GERD (gastroesophageal reflux disease)   . Left knee DJD   . High triglycerides    . Hyperlipidemia   . Sleep apnea   . S/P left total knee replacement   . Primary localized osteoarthritis of right knee     Past Surgical History  Procedure Laterality Date  . Vaginal hysterectomy  1994  . Uvulopalatopharyngoplasty, tonsillectomy and septoplasty  2004  . Knee arthroscopy w/ meniscectomy Right 2010  . Tonsillectomy    . Lasix eye    . Colonoscopy    . Total knee arthroplasty Left 08/11/2013    Procedure: LEFT TOTAL KNEE ARTHROPLASTY;  Surgeon: Lorn Junes, MD;  Location: [facility];  Service: Orthopedics;  Laterality: Left;  . Hysteroscopy  11/94    submuous myomas  . Abdominal hysterectomy  11/94    TVH    No prescriptions prior to admission   Allergies  Allergen Reactions  . Celebrex [Celecoxib] Swelling  . Penicillins Other (See Comments)    Drop in blood pressure    No current facility-administered medications for this encounter. Current outpatient prescriptions:acyclovir cream (ZOVIRAX) 5 %, Apply 1 application topically every 3 (three) hours as needed (out break)., Disp: , Rfl: ;  aspirin EC 325 MG tablet, Take 325 mg by mouth daily., Disp: , Rfl: ;  atorvastatin (LIPITOR) 10 MG tablet, Take 10 mg by mouth daily., Disp: , Rfl: ;  diltiazem (CARDIZEM CD) 240 MG 24 hr capsule, Take 240 mg by mouth daily., Disp: , Rfl:  fenofibrate (TRICOR) 145 MG tablet, Take 145 mg by mouth daily., Disp: , Rfl: ;  glipiZIDE (GLUCOTROL) 5 MG tablet, Take 5 mg by mouth 2 (two) times daily before a meal.,  Disp: , Rfl: ;  losartan-hydrochlorothiazide (HYZAAR) 100-25 MG per tablet, Take 1 tablet by mouth daily., Disp: , Rfl: ;  metFORMIN (GLUCOPHAGE) 500 MG tablet, Take 500 mg by mouth 2 (two) times daily with a meal., Disp: , Rfl:  Multiple Vitamin (MULTIVITAMIN WITH MINERALS) TABS tablet, Take 1 tablet by mouth daily., Disp: , Rfl: ;  omeprazole (PRILOSEC) 20 MG capsule, Take 20 mg by mouth daily., Disp: , Rfl: ;  PARoxetine (PAXIL) 40 MG tablet, Take 40 mg by mouth every morning.,  Disp: , Rfl: ;  pramipexole (MIRAPEX) 1 MG tablet, Take 1 mg by mouth at bedtime., Disp: , Rfl: ;  solifenacin (VESICARE) 10 MG tablet, Take 10 mg by mouth daily., Disp: , Rfl:  valACYclovir (VALTREX) 1000 MG tablet, Take 1,000 mg by mouth as needed (out break). , Disp: , Rfl:  History  Substance Use Topics  . Smoking status: Never Smoker   . Smokeless tobacco: Never Used  . Alcohol Use: 0.5 oz/week    1 drink(s) per week     Comment: occ wine/beer    Family History  Problem Relation Age of Onset  . Heart disease Mother   . Hypertension Mother   . Stroke Mother   . Breast cancer Mother   . Hypertension Brother   . Diabetes Mellitus II Father   . Cancer Father     CLL  . Breast cancer Paternal Grandmother      Review of Systems  Constitutional: Negative.   HENT: Negative.   Eyes: Negative.   Respiratory: Negative.   Cardiovascular: Negative.   Gastrointestinal: Negative.   Genitourinary: Negative.   Musculoskeletal: Positive for joint pain.  Skin: Negative.   Neurological: Negative.   Endo/Heme/Allergies: Negative.   Psychiatric/Behavioral: Negative.     Objective:  Physical Exam  Constitutional: She is oriented to person, place, and time. She appears well-developed and well-nourished.  HENT:  Head: Normocephalic and atraumatic.  Mouth/Throat: Oropharynx is clear and moist.  Eyes: Conjunctivae and EOM are normal. Pupils are equal, round, and reactive to light.  Neck: Neck supple.  Cardiovascular: Normal rate, regular rhythm and normal heart sounds.   Respiratory: Effort normal and breath sounds normal.  GI: Soft. Bowel sounds are normal.  Genitourinary:  Not pertinent to current symptomatology therefore not examined.  Musculoskeletal:    She ambulates with a slight antalgic gait.  Left knee has active range of motion 0-110 degrees well healed surgical scar 5/5 strength minimal pain. Right knee has active range of motion -5 to 120 degrees 2+ crepitus 2+ synovitis  medial joint.  2+ DP pulse  Neurological: She is alert and oriented to person, place, and time.  Skin: Skin is warm and dry.  Psychiatric: She has a normal mood and affect.    Vital signs in last 24 hours: Temp:  [98.2 F (36.8 C)] 98.2 F (36.8 C) (09/09 1300) Pulse Rate:  [70] 70 (09/09 1300) BP: (136)/(81) 136/81 mmHg (09/09 1300) SpO2:  [93 %] 93 % (09/09 1300) Weight:  [108.863 kg (240 lb)] 108.863 kg (240 lb) (09/09 1300)  Labs:   Estimated body mass index is 41.18 kg/(m^2) as calculated from the following:   Height as of this encounter: 5\' 4"  (1.626 m).   Weight as of this encounter: 108.863 kg (240 lb).   Imaging Review Plain radiographs demonstrate severe degenerative joint disease of the right knee(s). The overall alignment issignificant varus. The bone quality appears to be good for age and reported activity level.  Assessment/Plan:  End stage arthritis, right knee   The patient history, physical examination, clinical judgment of the provider and imaging studies are consistent with end stage degenerative joint disease of the right knee(s) and total knee arthroplasty is deemed medically necessary. The treatment options including medical management, injection therapy arthroscopy and arthroplasty were discussed at length. The risks and benefits of total knee arthroplasty were presented and reviewed. The risks due to aseptic loosening, infection, stiffness, patella tracking problems, thromboembolic complications and other imponderables were discussed. The patient acknowledged the explanation, agreed to proceed with the plan and consent was signed. Patient is being admitted for inpatient treatment for surgery, pain control, PT, OT, prophylactic antibiotics, VTE prophylaxis, progressive ambulation and ADL's and discharge planning. The patient is planning to be discharged home with home health services with

## 2014-04-23 NOTE — Pre-Procedure Instructions (Signed)
Diane Gregory  04/23/2014   Your procedure is scheduled on:  Monday May 04, 2014 at 9:56 AM.  Report to [facility] Admitting at 7:45 AM.  Call this number if you have problems the morning of surgery: [phone]   Remember:   Do not eat food or drink liquids after midnight.   Take these medicines the morning of surgery with A SIP OF WATER: Diltiazem (Cardizem), Omeprazole (Prilosec), Paroxetine (Paxil), Solifenacin (Vesicare), and Valacyclovir (Valtrex) if needed    Do NOT take any diabetic medications the morning of your surgery   Do not wear jewelry, make-up or nail polish.  Do not wear lotions, powders, or perfumes.   Do not shave 48 hours prior to surgery.   Do not bring valuables to the hospital.  [facility] is not responsible for any belongings or valuables.               Contacts, dentures or bridgework may not be worn into surgery.  Leave suitcase in the car. After surgery it may be brought to your room.  For patients admitted to the hospital, discharge time is determined by your treatment team.               Patients discharged the day of surgery will not be allowed to drive home.  Name and phone number of your driver: Family/Friend  Special Instructions: Shower using CHG soap the night before and the morning of your surgery  Please read over the following fact sheets that you were given: Pain Booklet, Coughing and Deep Breathing, Blood Transfusion Information, Total Joint Packet, MRSA Information and Surgical Site Infection Prevention

## 2014-04-24 ENCOUNTER — Encounter (HOSPITAL_COMMUNITY): Payer: Self-pay

## 2014-04-24 ENCOUNTER — Encounter (HOSPITAL_COMMUNITY)
Admission: RE | Admit: 2014-04-24 | Discharge: 2014-04-24 | Disposition: A | Discharge status: Home / Self Care | Payer: 59 | Source: Ambulatory Visit | Attending: Orthopedic Surgery | Admitting: Orthopedic Surgery

## 2014-04-24 DIAGNOSIS — Z01812 Encounter for preprocedural laboratory examination: Secondary | ICD-10-CM | POA: Insufficient documentation

## 2014-04-24 DIAGNOSIS — M1710 Unilateral primary osteoarthritis, unspecified knee: Secondary | ICD-10-CM | POA: Diagnosis not present

## 2014-04-24 HISTORY — DX: Depression, unspecified: F32.A

## 2014-04-24 HISTORY — DX: Major depressive disorder, single episode, unspecified: F32.9

## 2014-04-24 HISTORY — DX: Urgency of urination: R39.15

## 2014-04-24 LAB — SURGICAL PCR SCREEN
MRSA, PCR: NEGATIVE
STAPHYLOCOCCUS AUREUS: NEGATIVE

## 2014-04-24 LAB — BASIC METABOLIC PANEL
ANION GAP: 15 (ref 5–15)
BUN: 17 mg/dL (ref 6–23)
CALCIUM: 9.4 mg/dL (ref 8.4–10.5)
CHLORIDE: 100 meq/L (ref 96–112)
CO2: 24 mEq/L (ref 19–32)
CREATININE: 0.63 mg/dL (ref 0.50–1.10)
GFR calc Af Amer: >90 mL/min (ref >90)
GFR calc non Af Amer: >90 mL/min (ref >90)
Glucose, Bld: 140 mg/dL — ABNORMAL HIGH (ref 70–99)
Potassium: 3.9 mEq/L (ref 3.7–5.3)
Sodium: 139 mEq/L (ref 137–147)

## 2014-04-24 LAB — CBC
HCT: 39.3 % (ref 36.0–46.0)
HEMOGLOBIN: 12.8 g/dL (ref 12.0–15.0)
MCH: 26.4 pg (ref 26.0–34.0)
MCHC: 32.6 g/dL (ref 30.0–36.0)
MCV: 81 fL (ref 78.0–100.0)
Platelets: 269 10*3/uL (ref 150–400)
RBC: 4.85 MIL/uL (ref 3.87–5.11)
RDW: 14.4 % (ref 11.5–15.5)
WBC: 7.1 10*3/uL (ref 4.0–10.5)

## 2014-04-24 LAB — PROTIME-INR
INR: 1.08 (ref 0.00–1.49)
Prothrombin Time: 14 seconds (ref 11.6–15.2)

## 2014-04-24 LAB — TYPE AND SCREEN
ABO/RH(D): O POS
Antibody Screen: NEGATIVE

## 2014-04-24 LAB — APTT: aPTT: 32 seconds (ref 24–37)

## 2014-04-24 NOTE — Progress Notes (Signed)
PCP is [place] [facility] and patient informed Nurse that she had a stress test within the last 5 years. Will request results. Patient denied having any acute cardiac or pulmonary issues.

## 2014-05-04 ENCOUNTER — Encounter (HOSPITAL_COMMUNITY)
Admission: RE | Disposition: A | Discharge status: Home Health | Payer: Self-pay | Source: Ambulatory Visit | Attending: Orthopedic Surgery

## 2014-05-04 ENCOUNTER — Inpatient Hospital Stay (HOSPITAL_COMMUNITY): Payer: 59 | Admitting: Anesthesiology

## 2014-05-04 ENCOUNTER — Encounter (HOSPITAL_COMMUNITY): Payer: Self-pay | Admitting: Anesthesiology

## 2014-05-04 ENCOUNTER — Inpatient Hospital Stay (HOSPITAL_COMMUNITY)
Admission: RE | Admit: 2014-05-04 | Discharge: 2014-05-06 | DRG: 470 | Disposition: A | Discharge status: Home Health | Payer: 59 | Source: Ambulatory Visit | Attending: Orthopedic Surgery | Admitting: Orthopedic Surgery

## 2014-05-04 ENCOUNTER — Encounter (HOSPITAL_COMMUNITY): Payer: 59 | Admitting: Vascular Surgery

## 2014-05-04 DIAGNOSIS — Z833 Family history of diabetes mellitus: Secondary | ICD-10-CM | POA: Diagnosis not present

## 2014-05-04 DIAGNOSIS — K219 Gastro-esophageal reflux disease without esophagitis: Secondary | ICD-10-CM | POA: Diagnosis present

## 2014-05-04 DIAGNOSIS — G4730 Sleep apnea, unspecified: Secondary | ICD-10-CM | POA: Diagnosis present

## 2014-05-04 DIAGNOSIS — Z79899 Other long term (current) drug therapy: Secondary | ICD-10-CM | POA: Diagnosis not present

## 2014-05-04 DIAGNOSIS — I10 Essential (primary) hypertension: Secondary | ICD-10-CM | POA: Diagnosis present

## 2014-05-04 DIAGNOSIS — Z880 Allergy status to penicillin: Secondary | ICD-10-CM

## 2014-05-04 DIAGNOSIS — E781 Pure hyperglyceridemia: Secondary | ICD-10-CM | POA: Diagnosis present

## 2014-05-04 DIAGNOSIS — Z96659 Presence of unspecified artificial knee joint: Secondary | ICD-10-CM

## 2014-05-04 DIAGNOSIS — M1711 Unilateral primary osteoarthritis, right knee: Secondary | ICD-10-CM | POA: Diagnosis present

## 2014-05-04 DIAGNOSIS — M179 Osteoarthritis of knee, unspecified: Secondary | ICD-10-CM | POA: Diagnosis present

## 2014-05-04 DIAGNOSIS — Z8249 Family history of ischemic heart disease and other diseases of the circulatory system: Secondary | ICD-10-CM | POA: Diagnosis not present

## 2014-05-04 DIAGNOSIS — Z823 Family history of stroke: Secondary | ICD-10-CM

## 2014-05-04 DIAGNOSIS — G2581 Restless legs syndrome: Secondary | ICD-10-CM | POA: Diagnosis present

## 2014-05-04 DIAGNOSIS — Z888 Allergy status to other drugs, medicaments and biological substances status: Secondary | ICD-10-CM | POA: Diagnosis not present

## 2014-05-04 DIAGNOSIS — E119 Type 2 diabetes mellitus without complications: Secondary | ICD-10-CM | POA: Diagnosis present

## 2014-05-04 DIAGNOSIS — M1710 Unilateral primary osteoarthritis, unspecified knee: Principal | ICD-10-CM | POA: Diagnosis present

## 2014-05-04 DIAGNOSIS — E785 Hyperlipidemia, unspecified: Secondary | ICD-10-CM | POA: Diagnosis present

## 2014-05-04 HISTORY — DX: Unspecified chronic bronchitis: J42

## 2014-05-04 HISTORY — DX: Unspecified osteoarthritis, unspecified site: M19.90

## 2014-05-04 HISTORY — DX: Type 2 diabetes mellitus without complications: E11.9

## 2014-05-04 HISTORY — PX: TOTAL KNEE ARTHROPLASTY: SHX125

## 2014-05-04 HISTORY — DX: Unilateral primary osteoarthritis, right knee: M17.11

## 2014-05-04 HISTORY — DX: Pneumonia, unspecified organism: J18.9

## 2014-05-04 LAB — GLUCOSE, CAPILLARY
GLUCOSE-CAPILLARY: 194 mg/dL — AB (ref 70–99)
Glucose-Capillary: 113 mg/dL — ABNORMAL HIGH (ref 70–99)
Glucose-Capillary: 152 mg/dL — ABNORMAL HIGH (ref 70–99)
Glucose-Capillary: 260 mg/dL — ABNORMAL HIGH (ref 70–99)

## 2014-05-04 SURGERY — ARTHROPLASTY, KNEE, TOTAL
Anesthesia: General | Site: Knee | Laterality: Right

## 2014-05-04 MED ORDER — ATORVASTATIN CALCIUM 10 MG PO TABS
10.0000 mg | ORAL_TABLET | Freq: Every day | ORAL | Status: DC
  Administered 2014-05-04 – 2014-05-06 (×3): 10 mg via ORAL
  Filled 2014-05-04 (×3): qty 1

## 2014-05-04 MED ORDER — INSULIN ASPART 100 UNIT/ML ~~LOC~~ SOLN
0.0000 [IU] | Freq: Three times a day (TID) | SUBCUTANEOUS | Status: DC
  Administered 2014-05-05: 3 [IU] via SUBCUTANEOUS
  Administered 2014-05-05: 5 [IU] via SUBCUTANEOUS
  Administered 2014-05-05: 09:00:00 via SUBCUTANEOUS
  Administered 2014-05-06: 2 [IU] via SUBCUTANEOUS

## 2014-05-04 MED ORDER — GLYCOPYRROLATE 0.2 MG/ML IJ SOLN
INTRAMUSCULAR | Status: AC
  Filled 2014-05-04: qty 2

## 2014-05-04 MED ORDER — SUCCINYLCHOLINE CHLORIDE 20 MG/ML IJ SOLN
INTRAMUSCULAR | Status: AC
  Filled 2014-05-04: qty 1

## 2014-05-04 MED ORDER — ADULT MULTIVITAMIN W/MINERALS CH
1.0000 | ORAL_TABLET | Freq: Every day | ORAL | Status: DC
  Administered 2014-05-04 – 2014-05-06 (×3): 1 via ORAL
  Filled 2014-05-04 (×3): qty 1

## 2014-05-04 MED ORDER — DILTIAZEM HCL ER COATED BEADS 240 MG PO CP24
240.0000 mg | ORAL_CAPSULE | Freq: Every day | ORAL | Status: DC
  Administered 2014-05-04 – 2014-05-06 (×3): 240 mg via ORAL
  Filled 2014-05-04 (×3): qty 1

## 2014-05-04 MED ORDER — FENTANYL CITRATE 0.05 MG/ML IJ SOLN
INTRAMUSCULAR | Status: DC | PRN
  Administered 2014-05-04 (×5): 50 ug via INTRAVENOUS

## 2014-05-04 MED ORDER — ACETAMINOPHEN 650 MG RE SUPP: 650.0000 mg | Freq: Four times a day (QID) | RECTAL | Status: DC | PRN

## 2014-05-04 MED ORDER — SODIUM CHLORIDE 0.9 % IR SOLN
Status: DC | PRN
  Administered 2014-05-04: 3000 mL

## 2014-05-04 MED ORDER — PROMETHAZINE HCL 25 MG/ML IJ SOLN: 6.2500 mg | INTRAMUSCULAR | Status: DC | PRN

## 2014-05-04 MED ORDER — ROCURONIUM BROMIDE 50 MG/5ML IV SOLN
INTRAVENOUS | Status: AC
  Filled 2014-05-04: qty 1

## 2014-05-04 MED ORDER — INSULIN ASPART 100 UNIT/ML ~~LOC~~ SOLN: 0.0000 [IU] | Freq: Every day | SUBCUTANEOUS | Status: DC

## 2014-05-04 MED ORDER — POVIDONE-IODINE 7.5 % EX SOLN: Freq: Once | CUTANEOUS | Status: DC

## 2014-05-04 MED ORDER — INSULIN ASPART 100 UNIT/ML ~~LOC~~ SOLN
4.0000 [IU] | Freq: Three times a day (TID) | SUBCUTANEOUS | Status: DC
  Administered 2014-05-04 – 2014-05-06 (×6): 4 [IU] via SUBCUTANEOUS

## 2014-05-04 MED ORDER — FENTANYL CITRATE 0.05 MG/ML IJ SOLN
INTRAMUSCULAR | Status: AC
  Administered 2014-05-04: 100 ug via INTRAVENOUS
  Filled 2014-05-04: qty 2

## 2014-05-04 MED ORDER — DEXAMETHASONE SODIUM PHOSPHATE 10 MG/ML IJ SOLN
10.0000 mg | Freq: Four times a day (QID) | INTRAMUSCULAR | Status: AC
  Administered 2014-05-04 (×2): 10 mg via INTRAVENOUS
  Filled 2014-05-04 (×3): qty 1

## 2014-05-04 MED ORDER — FENTANYL CITRATE 0.05 MG/ML IJ SOLN
INTRAMUSCULAR | Status: AC
  Filled 2014-05-04: qty 5

## 2014-05-04 MED ORDER — LACTATED RINGERS IV SOLN
INTRAVENOUS | Status: DC | PRN
  Administered 2014-05-04 (×3): via INTRAVENOUS

## 2014-05-04 MED ORDER — MIDAZOLAM HCL 2 MG/2ML IJ SOLN
INTRAMUSCULAR | Status: AC
  Administered 2014-05-04: 2 mg
  Filled 2014-05-04: qty 2

## 2014-05-04 MED ORDER — HYDROCHLOROTHIAZIDE 25 MG PO TABS
25.0000 mg | ORAL_TABLET | Freq: Every day | ORAL | Status: DC
  Administered 2014-05-04 – 2014-05-06 (×3): 25 mg via ORAL
  Filled 2014-05-04 (×3): qty 1

## 2014-05-04 MED ORDER — HYDROMORPHONE HCL 1 MG/ML IJ SOLN
1.0000 mg | INTRAMUSCULAR | Status: DC | PRN
  Administered 2014-05-04: 1 mg via INTRAVENOUS
  Filled 2014-05-04: qty 1

## 2014-05-04 MED ORDER — PHENOL 1.4 % MT LIQD: 1.0000 | OROMUCOSAL | Status: DC | PRN

## 2014-05-04 MED ORDER — ONDANSETRON HCL 4 MG/2ML IJ SOLN: 4.0000 mg | Freq: Four times a day (QID) | INTRAMUSCULAR | Status: DC | PRN

## 2014-05-04 MED ORDER — LIDOCAINE HCL (CARDIAC) 20 MG/ML IV SOLN
INTRAVENOUS | Status: AC
  Filled 2014-05-04: qty 5

## 2014-05-04 MED ORDER — METOCLOPRAMIDE HCL 10 MG PO TABS
5.0000 mg | ORAL_TABLET | Freq: Three times a day (TID) | ORAL | Status: DC | PRN
Start: 2014-05-04 — End: 2014-05-06

## 2014-05-04 MED ORDER — NEOSTIGMINE METHYLSULFATE 10 MG/10ML IV SOLN
INTRAVENOUS | Status: AC
  Filled 2014-05-04: qty 1

## 2014-05-04 MED ORDER — DEXAMETHASONE SODIUM PHOSPHATE 10 MG/ML IJ SOLN
INTRAMUSCULAR | Status: DC | PRN
  Administered 2014-05-04: 10 mg via INTRAVENOUS

## 2014-05-04 MED ORDER — PROPOFOL 10 MG/ML IV BOLUS
INTRAVENOUS | Status: AC
Start: 2014-05-04 — End: 2014-05-04
  Filled 2014-05-04: qty 20

## 2014-05-04 MED ORDER — PRAMIPEXOLE DIHYDROCHLORIDE 1 MG PO TABS
1.0000 mg | ORAL_TABLET | Freq: Every day | ORAL | Status: DC
  Administered 2014-05-04 – 2014-05-05 (×2): 1 mg via ORAL
  Filled 2014-05-04 (×5): qty 1

## 2014-05-04 MED ORDER — CLINDAMYCIN PHOSPHATE 600 MG/50ML IV SOLN
600.0000 mg | Freq: Four times a day (QID) | INTRAVENOUS | Status: AC
  Administered 2014-05-04 (×2): 600 mg via INTRAVENOUS
  Filled 2014-05-04 (×2): qty 50

## 2014-05-04 MED ORDER — METOCLOPRAMIDE HCL 5 MG/ML IJ SOLN: 5.0000 mg | Freq: Three times a day (TID) | INTRAMUSCULAR | Status: DC | PRN

## 2014-05-04 MED ORDER — MIDAZOLAM HCL 2 MG/2ML IJ SOLN
INTRAMUSCULAR | Status: AC
  Filled 2014-05-04: qty 2

## 2014-05-04 MED ORDER — HYDROMORPHONE HCL 1 MG/ML IJ SOLN: 0.2500 mg | INTRAMUSCULAR | Status: DC | PRN

## 2014-05-04 MED ORDER — INSULIN GLARGINE 100 UNIT/ML ~~LOC~~ SOLN
10.0000 [IU] | Freq: Every day | SUBCUTANEOUS | Status: DC
  Administered 2014-05-04 – 2014-05-05 (×2): 10 [IU] via SUBCUTANEOUS
  Filled 2014-05-04 (×3): qty 0.1

## 2014-05-04 MED ORDER — RIVAROXABAN 10 MG PO TABS
10.0000 mg | ORAL_TABLET | Freq: Every day | ORAL | Status: DC
  Filled 2014-05-04 (×3): qty 1

## 2014-05-04 MED ORDER — DARIFENACIN HYDROBROMIDE ER 7.5 MG PO TB24
7.5000 mg | ORAL_TABLET | Freq: Every day | ORAL | Status: DC
  Administered 2014-05-05 – 2014-05-06 (×2): 7.5 mg via ORAL
  Filled 2014-05-04 (×2): qty 1

## 2014-05-04 MED ORDER — LACTATED RINGERS IV SOLN
INTRAVENOUS | Status: DC
  Administered 2014-05-04: 08:00:00 via INTRAVENOUS

## 2014-05-04 MED ORDER — LOSARTAN POTASSIUM 50 MG PO TABS
100.0000 mg | ORAL_TABLET | Freq: Every day | ORAL | Status: DC
  Administered 2014-05-04 – 2014-05-06 (×3): 100 mg via ORAL
  Filled 2014-05-04 (×3): qty 2

## 2014-05-04 MED ORDER — PAROXETINE HCL 20 MG PO TABS
40.0000 mg | ORAL_TABLET | ORAL | Status: DC
  Administered 2014-05-06: 40 mg via ORAL
  Filled 2014-05-04 (×3): qty 2

## 2014-05-04 MED ORDER — 0.9 % SODIUM CHLORIDE (POUR BTL) OPTIME
TOPICAL | Status: DC | PRN
  Administered 2014-05-04: 1000 mL

## 2014-05-04 MED ORDER — LACTATED RINGERS IV SOLN: INTRAVENOUS | Status: DC

## 2014-05-04 MED ORDER — ONDANSETRON HCL 4 MG/2ML IJ SOLN
INTRAMUSCULAR | Status: AC
  Filled 2014-05-04: qty 2

## 2014-05-04 MED ORDER — GLYCOPYRROLATE 0.2 MG/ML IJ SOLN
INTRAMUSCULAR | Status: AC
  Filled 2014-05-04: qty 1

## 2014-05-04 MED ORDER — PHENYLEPHRINE HCL 10 MG/ML IJ SOLN
INTRAMUSCULAR | Status: DC | PRN
  Administered 2014-05-04 (×3): 40 ug via INTRAVENOUS

## 2014-05-04 MED ORDER — SODIUM CHLORIDE 0.9 % IJ SOLN
INTRAMUSCULAR | Status: AC
  Filled 2014-05-04: qty 10

## 2014-05-04 MED ORDER — DOCUSATE SODIUM 100 MG PO CAPS
100.0000 mg | ORAL_CAPSULE | Freq: Two times a day (BID) | ORAL | Status: DC
  Administered 2014-05-04 – 2014-05-06 (×5): 100 mg via ORAL
  Filled 2014-05-04 (×6): qty 1

## 2014-05-04 MED ORDER — CHLORHEXIDINE GLUCONATE 4 % EX LIQD: 60.0000 mL | Freq: Once | CUTANEOUS | Status: DC

## 2014-05-04 MED ORDER — LOSARTAN POTASSIUM-HCTZ 100-25 MG PO TABS: 1.0000 | ORAL_TABLET | Freq: Every day | ORAL | Status: DC

## 2014-05-04 MED ORDER — PROPOFOL 10 MG/ML IV BOLUS
INTRAVENOUS | Status: DC | PRN
  Administered 2014-05-04: 150 mg via INTRAVENOUS

## 2014-05-04 MED ORDER — ALUM & MAG HYDROXIDE-SIMETH 200-200-20 MG/5ML PO SUSP: 30.0000 mL | ORAL | Status: DC | PRN

## 2014-05-04 MED ORDER — BUPIVACAINE-EPINEPHRINE (PF) 0.25% -1:200000 IJ SOLN
INTRAMUSCULAR | Status: AC
  Filled 2014-05-04: qty 30

## 2014-05-04 MED ORDER — PANTOPRAZOLE SODIUM 40 MG PO TBEC
40.0000 mg | DELAYED_RELEASE_TABLET | Freq: Every day | ORAL | Status: DC
  Administered 2014-05-04 – 2014-05-06 (×3): 40 mg via ORAL
  Filled 2014-05-04 (×3): qty 1

## 2014-05-04 MED ORDER — DIPHENHYDRAMINE HCL 12.5 MG/5ML PO ELIX: 12.5000 mg | ORAL_SOLUTION | ORAL | Status: DC | PRN

## 2014-05-04 MED ORDER — MENTHOL 3 MG MT LOZG: 1.0000 | LOZENGE | OROMUCOSAL | Status: DC | PRN

## 2014-05-04 MED ORDER — VALACYCLOVIR HCL 500 MG PO TABS
1000.0000 mg | ORAL_TABLET | ORAL | Status: DC | PRN
  Filled 2014-05-04: qty 2

## 2014-05-04 MED ORDER — POTASSIUM CHLORIDE IN NACL 20-0.9 MEQ/L-% IV SOLN
INTRAVENOUS | Status: DC
  Administered 2014-05-04: 16:00:00 via INTRAVENOUS
  Filled 2014-05-04 (×7): qty 1000

## 2014-05-04 MED ORDER — CLINDAMYCIN PHOSPHATE 900 MG/50ML IV SOLN
900.0000 mg | INTRAVENOUS | Status: AC
  Administered 2014-05-04: 900 mg via INTRAVENOUS
  Filled 2014-05-04: qty 50

## 2014-05-04 MED ORDER — ONDANSETRON HCL 4 MG PO TABS: 4.0000 mg | ORAL_TABLET | Freq: Four times a day (QID) | ORAL | Status: DC | PRN

## 2014-05-04 MED ORDER — BUPIVACAINE-EPINEPHRINE 0.25% -1:200000 IJ SOLN
INTRAMUSCULAR | Status: DC | PRN
  Administered 2014-05-04: 30 mL

## 2014-05-04 MED ORDER — FENTANYL CITRATE 0.05 MG/ML IJ SOLN
100.0000 ug | Freq: Once | INTRAMUSCULAR | Status: AC
  Administered 2014-05-04: 100 ug via INTRAVENOUS

## 2014-05-04 MED ORDER — FENOFIBRATE 160 MG PO TABS
160.0000 mg | ORAL_TABLET | Freq: Every day | ORAL | Status: DC
Start: 2014-05-04 — End: 2014-05-06
  Administered 2014-05-04 – 2014-05-06 (×3): 160 mg via ORAL
  Filled 2014-05-04 (×3): qty 1

## 2014-05-04 MED ORDER — EPHEDRINE SULFATE 50 MG/ML IJ SOLN
INTRAMUSCULAR | Status: AC
  Filled 2014-05-04: qty 1

## 2014-05-04 MED ORDER — POLYETHYLENE GLYCOL 3350 17 G PO PACK
17.0000 g | PACK | Freq: Two times a day (BID) | ORAL | Status: DC
  Administered 2014-05-04 – 2014-05-06 (×5): 17 g via ORAL
  Filled 2014-05-04 (×7): qty 1

## 2014-05-04 MED ORDER — ACETAMINOPHEN 325 MG PO TABS: 650.0000 mg | ORAL_TABLET | Freq: Four times a day (QID) | ORAL | Status: DC | PRN

## 2014-05-04 MED ORDER — LIDOCAINE HCL (CARDIAC) 20 MG/ML IV SOLN
INTRAVENOUS | Status: DC | PRN
  Administered 2014-05-04: 100 mg via INTRAVENOUS

## 2014-05-04 MED ORDER — ONDANSETRON HCL 4 MG/2ML IJ SOLN
INTRAMUSCULAR | Status: DC | PRN
  Administered 2014-05-04: 4 mg via INTRAVENOUS

## 2014-05-04 MED ORDER — OXYCODONE HCL 5 MG PO TABS
5.0000 mg | ORAL_TABLET | ORAL | Status: DC | PRN
  Administered 2014-05-04 – 2014-05-05 (×4): 10 mg via ORAL
  Administered 2014-05-05: 5 mg via ORAL
  Administered 2014-05-05 – 2014-05-06 (×5): 10 mg via ORAL
  Filled 2014-05-04 (×2): qty 2
  Filled 2014-05-04: qty 1
  Filled 2014-05-04 (×6): qty 2

## 2014-05-04 MED ORDER — PHENYLEPHRINE 40 MCG/ML (10ML) SYRINGE FOR IV PUSH (FOR BLOOD PRESSURE SUPPORT)
PREFILLED_SYRINGE | INTRAVENOUS | Status: AC
  Filled 2014-05-04: qty 10

## 2014-05-04 MED ORDER — ROCURONIUM BROMIDE 100 MG/10ML IV SOLN
INTRAVENOUS | Status: DC | PRN
  Administered 2014-05-04: 50 mg via INTRAVENOUS

## 2014-05-04 SURGICAL SUPPLY — 78 items
BANDAGE ELASTIC 6 VELCRO ST LF (GAUZE/BANDAGES/DRESSINGS) ×3 IMPLANT
BANDAGE ESMARK 6X9 LF (GAUZE/BANDAGES/DRESSINGS) ×1 IMPLANT
BLADE SAGITTAL 25.0X1.19X90 (BLADE) ×2 IMPLANT
BLADE SAGITTAL 25.0X1.19X90MM (BLADE) ×1
BLADE SAW SGTL 11.0X1.19X90.0M (BLADE) IMPLANT
BLADE SAW SGTL 13.0X1.19X90.0M (BLADE) ×3 IMPLANT
BLADE SURG 10 STRL SS (BLADE) ×6 IMPLANT
BNDG ELASTIC 6X15 VLCR STRL LF (GAUZE/BANDAGES/DRESSINGS) ×3 IMPLANT
BNDG ESMARK 6X9 LF (GAUZE/BANDAGES/DRESSINGS) ×3
BOWL SMART MIX CTS (DISPOSABLE) ×3 IMPLANT
CAPT RP KNEE ×3 IMPLANT
CEMENT HV SMART SET (Cement) ×6 IMPLANT
CLOSURE STERI-STRIP 1/2X4 (GAUZE/BANDAGES/DRESSINGS) ×1
CLOSURE WOUND 1/2 X4 (GAUZE/BANDAGES/DRESSINGS) ×1
CLSR STERI-STRIP ANTIMIC 1/2X4 (GAUZE/BANDAGES/DRESSINGS) ×2 IMPLANT
COVER SURGICAL LIGHT HANDLE (MISCELLANEOUS) ×3 IMPLANT
COVER TABLE BACK 60X90 (DRAPES) ×3 IMPLANT
CUFF TOURNIQUET SINGLE 34IN LL (TOURNIQUET CUFF) ×3 IMPLANT
CUFF TOURNIQUET SINGLE 44IN (TOURNIQUET CUFF) IMPLANT
DRAPE EXTREMITY T 121X128X90 (DRAPE) ×3 IMPLANT
DRAPE INCISE IOBAN 66X45 STRL (DRAPES) ×3 IMPLANT
DRAPE PROXIMA HALF (DRAPES) ×3 IMPLANT
DRAPE U-SHAPE 47X51 STRL (DRAPES) ×3 IMPLANT
DRSG ADAPTIC 3X8 NADH LF (GAUZE/BANDAGES/DRESSINGS) ×3 IMPLANT
DRSG AQUACEL AG ADV 3.5X14 (GAUZE/BANDAGES/DRESSINGS) ×3 IMPLANT
DRSG PAD ABDOMINAL 8X10 ST (GAUZE/BANDAGES/DRESSINGS) ×3 IMPLANT
DURAPREP 26ML APPLICATOR (WOUND CARE) ×6 IMPLANT
ELECT CAUTERY BLADE 6.4 (BLADE) ×3 IMPLANT
ELECT REM PT RETURN 9FT ADLT (ELECTROSURGICAL) ×3
ELECTRODE REM PT RTRN 9FT ADLT (ELECTROSURGICAL) ×1 IMPLANT
EVACUATOR 1/8 PVC DRAIN (DRAIN) ×3 IMPLANT
FACESHIELD WRAPAROUND (MASK) ×3 IMPLANT
GAUZE SPONGE 4X4 12PLY STRL (GAUZE/BANDAGES/DRESSINGS) ×3 IMPLANT
GLOVE BIO SURGEON STRL SZ7 (GLOVE) ×9 IMPLANT
GLOVE BIO SURGEON STRL SZ7.5 (GLOVE) ×6 IMPLANT
GLOVE BIOGEL PI IND STRL 7.0 (GLOVE) ×2 IMPLANT
GLOVE BIOGEL PI IND STRL 7.5 (GLOVE) ×3 IMPLANT
GLOVE BIOGEL PI INDICATOR 7.0 (GLOVE) ×4
GLOVE BIOGEL PI INDICATOR 7.5 (GLOVE) ×6
GLOVE BIOGEL PI ORTHO PRO SZ7 (GLOVE) ×2
GLOVE ECLIPSE 7.0 STRL STRAW (GLOVE) ×3 IMPLANT
GLOVE PI ORTHO PRO STRL SZ7 (GLOVE) ×1 IMPLANT
GLOVE SS BIOGEL STRL SZ 7.5 (GLOVE) ×1 IMPLANT
GLOVE SUPERSENSE BIOGEL SZ 7.5 (GLOVE) ×2
GLOVE SURG SS PI 7.0 STRL IVOR (GLOVE) ×6 IMPLANT
GOWN STRL REUS W/ TWL LRG LVL3 (GOWN DISPOSABLE) ×3 IMPLANT
GOWN STRL REUS W/ TWL XL LVL3 (GOWN DISPOSABLE) ×2 IMPLANT
GOWN STRL REUS W/TWL LRG LVL3 (GOWN DISPOSABLE) ×6
GOWN STRL REUS W/TWL XL LVL3 (GOWN DISPOSABLE) ×4
HANDPIECE INTERPULSE COAX TIP (DISPOSABLE) ×2
HOOD PEEL AWAY FACE SHEILD DIS (HOOD) ×12 IMPLANT
IMMOBILIZER KNEE 22 UNIV (SOFTGOODS) ×3 IMPLANT
KIT BASIN OR (CUSTOM PROCEDURE TRAY) ×3 IMPLANT
KIT ROOM TURNOVER OR (KITS) ×3 IMPLANT
MANIFOLD NEPTUNE II (INSTRUMENTS) ×3 IMPLANT
MARKER SKIN DUAL TIP RULER LAB (MISCELLANEOUS) ×3 IMPLANT
NS IRRIG 1000ML POUR BTL (IV SOLUTION) ×3 IMPLANT
PACK TOTAL JOINT (CUSTOM PROCEDURE TRAY) ×3 IMPLANT
PAD ARMBOARD 7.5X6 YLW CONV (MISCELLANEOUS) ×6 IMPLANT
PADDING CAST ABS 4INX4YD NS (CAST SUPPLIES) ×2
PADDING CAST ABS COTTON 4X4 ST (CAST SUPPLIES) ×1 IMPLANT
PADDING CAST COTTON 6X4 STRL (CAST SUPPLIES) ×3 IMPLANT
RUBBERBAND STERILE (MISCELLANEOUS) ×3 IMPLANT
SET HNDPC FAN SPRY TIP SCT (DISPOSABLE) ×1 IMPLANT
SPONGE GAUZE 4X4 12PLY STER LF (GAUZE/BANDAGES/DRESSINGS) ×3 IMPLANT
STRIP CLOSURE SKIN 1/2X4 (GAUZE/BANDAGES/DRESSINGS) ×2 IMPLANT
SUCTION FRAZIER TIP 10 FR DISP (SUCTIONS) ×3 IMPLANT
SUT ETHIBOND NAB CT1 #1 30IN (SUTURE) ×6 IMPLANT
SUT MNCRL AB 3-0 PS2 18 (SUTURE) ×3 IMPLANT
SUT VIC AB 0 CT1 27 (SUTURE) ×6
SUT VIC AB 0 CT1 27XBRD ANBCTR (SUTURE) ×3 IMPLANT
SUT VIC AB 2-0 CT1 27 (SUTURE) ×4
SUT VIC AB 2-0 CT1 TAPERPNT 27 (SUTURE) ×2 IMPLANT
SYR 30ML SLIP (SYRINGE) ×3 IMPLANT
TOWEL OR 17X24 6PK STRL BLUE (TOWEL DISPOSABLE) ×3 IMPLANT
TOWEL OR 17X26 10 PK STRL BLUE (TOWEL DISPOSABLE) ×3 IMPLANT
TRAY FOLEY CATH 16FR SILVER (SET/KITS/TRAYS/PACK) ×3 IMPLANT
WATER STERILE IRR 1000ML POUR (IV SOLUTION) ×6 IMPLANT

## 2014-05-04 NOTE — Anesthesia Postprocedure Evaluation (Signed)
  Anesthesia Post-op Note  Patient: [place]  Procedure(s) Performed: Procedure(s) (LRB): RIGHT TOTAL KNEE ARTHROPLASTY (Right)  Patient Location: PACU  Anesthesia Type: GA combined with regional for post-op pain  Level of Consciousness: awake and alert   Airway and Oxygen Therapy: Patient Spontanous Breathing  Post-op Pain: mild  Post-op Assessment: Post-op Vital signs reviewed, Patient's Cardiovascular Status Stable, Respiratory Function Stable, Patent Airway and No signs of Nausea or vomiting  Last Vitals:  Filed Vitals:   05/04/14 1202  BP: 130/55  Pulse: 75  Temp:   Resp: 16    Post-op Vital Signs: stable   Complications: No apparent anesthesia complications

## 2014-05-04 NOTE — Anesthesia Preprocedure Evaluation (Addendum)
Anesthesia Evaluation  Patient identified by MRN, date of birth, ID band Patient awake    Reviewed: Allergy & Precautions, H&P , NPO status , Patient's Chart, lab work & pertinent test results  History of Anesthesia Complications Negative for: history of anesthetic complications  Airway Mallampati: III TM Distance: <3 FB Neck ROM: Full    Dental no notable dental hx. (+) Teeth Intact, Dental Advisory Given   Pulmonary neg pulmonary ROS,  breath sounds clear to auscultation  Pulmonary exam normal       Cardiovascular hypertension, Pt. on medications Rhythm:Regular Rate:Normal     Neuro/Psych negative neurological ROS  negative psych ROS   GI/Hepatic Neg liver ROS, GERD-  Medicated and Controlled,  Endo/Other  diabetes, Type 2, Oral Hypoglycemic AgentsMorbid obesityCBG = 113 @ 0725  Renal/GU negative Renal ROS  negative genitourinary   Musculoskeletal   Abdominal   Peds negative pediatric ROS (+)  Hematology negative hematology ROS (+)   Anesthesia Other Findings   Reproductive/Obstetrics negative OB ROS                         Anesthesia Physical Anesthesia Plan  ASA: III  Anesthesia Plan: General   Post-op Pain Management:    Induction: Intravenous  Airway Management Planned: Oral ETT  Additional Equipment:   Intra-op Plan:   Post-operative Plan: Extubation in OR  Informed Consent: I have reviewed the patients History and Physical, chart, labs and discussed the procedure including the risks, benefits and alternatives for the proposed anesthesia with the patient or authorized representative who has indicated his/her understanding and acceptance.   Dental advisory given  Plan Discussed with: CRNA and Surgeon  Anesthesia Plan Comments:         Anesthesia Quick Evaluation

## 2014-05-04 NOTE — Anesthesia Procedure Notes (Addendum)
Anesthesia Regional Block:  Femoral nerve block  Pre-Anesthetic Checklist: ,, timeout performed, Correct Patient, Correct Site, Correct Laterality, Correct Procedure, Correct Position, site marked, Risks and benefits discussed,  Surgical consent,  Pre-op evaluation,  At surgeon's request and post-op pain management  Laterality: Right  Prep: chloraprep       Needles:  Injection technique: Single-shot  Needle Type: Echogenic Stimulator Needle     Needle Length: 9cm 9 cm Needle Gauge: 22 and 22 G    Additional Needles:  Procedures: ultrasound guided (picture in chart) Femoral nerve block  Nerve Stimulator or Paresthesia:  Response: 0.5 mA,   Additional Responses:   Narrative:  Injection made incrementally with aspirations every 5 mL.  Performed by: Personally  Anesthesiologist: Myrtie Soman MD  Additional Notes: Patient tolerated the procedure well without complications   Procedure Name: Intubation Date/Time: 05/04/2014 9:14 AM Performed by: Jenne Campus Pre-anesthesia Checklist: Patient identified, Emergency Drugs available, Suction available, Patient being monitored and Timeout performed Patient Re-evaluated:Patient Re-evaluated prior to inductionOxygen Delivery Method: Circle system utilized Preoxygenation: Pre-oxygenation with 100% oxygen Intubation Type: IV induction Ventilation: Mask ventilation without difficulty and Oral airway inserted - appropriate to patient size Laryngoscope Size: Miller and 2 Grade View: Grade II Tube type: Oral Tube size: 7.0 mm Number of attempts: 1 Airway Equipment and Method: Stylet Placement Confirmation: ETT inserted through vocal cords under direct vision,  positive ETCO2,  CO2 detector and breath sounds checked- equal and bilateral Secured at: 21 cm Tube secured with: Tape Dental Injury: Teeth and Oropharynx as per pre-operative assessment

## 2014-05-04 NOTE — Interval H&P Note (Signed)
History and Physical Interval Note:  05/04/2014 8:58 AM  [name]  has presented today for surgery, with the diagnosis of degenerative joint disease right knee  The various methods of treatment have been discussed with the patient and family. After consideration of risks, benefits and other options for treatment, the patient has consented to  Procedure(s): RIGHT TOTAL KNEE ARTHROPLASTY (Right) as a surgical intervention .  The patient's history has been reviewed, patient examined, no change in status, stable for surgery.  I have reviewed the patient's chart and labs.  Questions were answered to the patient's satisfaction.     Diane Gregory A

## 2014-05-04 NOTE — Evaluation (Signed)
Physical Therapy Evaluation Patient Details Name: Diane Gregory MRN: [id number] DOB: 07/16/1956 Today's Date: 05/04/2014   History of Present Illness  58 yo F s/p R TKA  Clinical Impression  Pt is s/p TKA resulting in the deficits listed below (see PT Problem List). Pt will benefit from skilled PT to increase their independence and safety with mobility to allow discharge home with help from family and friends.    Follow Up Recommendations Home health PT;Supervision for mobility/OOB    Equipment Recommendations  None recommended by PT    Recommendations for Other Services       Precautions / Restrictions Precautions Precautions: Knee Required Braces or Orthoses: Knee Immobilizer - Right Knee Immobilizer - Right: On when out of bed or walking Restrictions Weight Bearing Restrictions: Yes RLE Weight Bearing: Weight bearing as tolerated      Mobility  Bed Mobility Overal bed mobility: Needs Assistance Bed Mobility: Supine to Sit     Supine to sit: Min assist     General bed mobility comments: Min A with RLE.  Used rails and HOB elevated  Transfers Overall transfer level: Needs assistance Equipment used: Rolling walker (2 wheeled) Transfers: Sit to/from Stand Sit to Stand: Mod assist         General transfer comment: Cues for technique and physical assist required  Ambulation/Gait Ambulation/Gait assistance: Mod assist Ambulation Distance (Feet): 3 Feet Assistive device: Rolling walker (2 wheeled) Gait Pattern/deviations: Step-to pattern;Decreased stride length;Antalgic Gait velocity: very slow      Stairs            Wheelchair Mobility    Modified Rankin (Stroke Patients Only)       Balance                                             Pertinent Vitals/Pain  Does not have any pain in R knee currently.     Home Living Family/patient expects to be discharged to:: Private residence Living Arrangements: Alone Available Help  at Discharge: Family;Available 24 hours/day;Friend(s) Type of Home: House Home Access: Stairs to enter Entrance Stairs-Rails: None Entrance Stairs-Number of Steps: 1 Home Layout: One level Home Equipment: Bedside commode;Walker - 2 wheels Additional Comments: has CPM at home    Prior Function Level of Independence: Independent               Hand Dominance   Dominant Hand: Right    Extremity/Trunk Assessment               Lower Extremity Assessment: RLE deficits/detail RLE Deficits / Details: Weakness, secondary to R TKA today. not formally assessed    Cervical / Trunk Assessment: Normal  Communication   Communication: No difficulties  Cognition Arousal/Alertness: Awake/alert Behavior During Therapy: WFL for tasks assessed/performed Overall Cognitive Status: Within Functional Limits for tasks assessed                      General Comments General comments (skin integrity, edema, etc.): Pt very motivated to return home. No family present for eval    Exercises Total Joint Exercises Ankle Circles/Pumps: AROM;Both;10 reps;Supine      Assessment/Plan    PT Assessment Patient needs continued PT services  PT Diagnosis Difficulty walking   PT Problem List Decreased strength;Decreased range of motion;Decreased activity tolerance;Decreased mobility;Decreased knowledge of use of DME  PT Treatment Interventions  DME instruction;Gait training;Stair training;Therapeutic exercise;Patient/family education   PT Goals (Current goals can be found in the Care Plan section) Acute Rehab PT Goals Patient Stated Goal: To return home and be able to get up to chair and walk PT Goal Formulation: With patient Time For Goal Achievement: 05/11/14 Potential to Achieve Goals: Good    Frequency 7X/week   Barriers to discharge        Co-evaluation               End of Session Equipment Utilized During Treatment: Gait belt;Right knee immobilizer;Oxygen Activity  Tolerance: Patient tolerated treatment well Patient left: in chair;with call bell/phone within reach Nurse Communication: Mobility status         Time: 1735-6701 PT Time Calculation (min): 22 min   Charges:   PT Evaluation $Initial PT Evaluation Tier I: 1 Procedure PT Treatments $Gait Training: 8-22 mins   PT G Codes:          Melvern Banker 05/04/2014, 3:33 PM Lavonia Dana, PT  [phone] 05/04/2014

## 2014-05-04 NOTE — Op Note (Signed)
MRN:     [id number] DOB/AGE:    09/05/1955 / 58 y.o.       OPERATIVE REPORT    DATE OF PROCEDURE:  05/04/2014       PREOPERATIVE DIAGNOSIS:   degenerative joint disease right knee      Estimated body mass index is 41.18 kg/(m^2) as calculated from the following:   Height as of this encounter: 5\' 4"  (1.626 m).   Weight as of this encounter: 108.863 kg (240 lb).                                                        POSTOPERATIVE DIAGNOSIS:   degenerative joint disease right knee                                                                      PROCEDURE:  Procedure(s): RIGHT TOTAL KNEE ARTHROPLASTY Using Depuy Sigma RP implants #3 Femur, #3Tibia, 50mm sigma RP bearing, 32 Patella     SURGEON: Marquay Kruse A    ASSISTANT:  Kirstin Shepperson PA-C   (Present and scrubbed throughout the case, critical for assistance with exposure, retraction, instrumentation, and closure.)         ANESTHESIA: GET with Femoral Nerve Block  DRAINS: foley, 2 medium hemovac in knee   TOURNIQUET TIME: 40JWJ   COMPLICATIONS:  None     SPECIMENS: None   INDICATIONS FOR PROCEDURE: The patient has  degenerative joint disease right knee, varus deformities, XR shows bone on bone arthritis. Patient has failed all conservative measures including anti-inflammatory medicines, narcotics, attempts at  exercise and weight loss, cortisone injections and viscosupplementation.  Risks and benefits of surgery have been discussed, questions answered.   DESCRIPTION OF PROCEDURE: The patient identified by armband, received  right femoral nerve block and IV antibiotics, in the holding area at [facility]. Patient taken to the operating room, appropriate anesthetic  monitors were attached General endotracheal anesthesia induced with  the patient in supine position, Foley catheter was inserted. Tourniquet  applied high to the operative thigh. Lateral post and foot positioner  applied to the table, the lower extremity  was then prepped and draped  in usual sterile fashion from the ankle to the tourniquet. Time-out procedure was performed. The limb was wrapped with an Esmarch bandage and the tourniquet inflated to 365 mmHg. We began the operation by making the anterior midline incision starting at handbreadth above the patella going over the patella 1 cm medial to and  4 cm distal to the tibial tubercle. Small bleeders in the skin and the  subcutaneous tissue identified and cauterized. Transverse retinaculum was incised and reflected medially and a medial parapatellar arthrotomy was accomplished. the patella was everted and theprepatellar fat pad resected. The superficial medial collateral  ligament was then elevated from anterior to posterior along the proximal  flare of the tibia and anterior half of the menisci resected. The knee was hyperflexed exposing bone on bone arthritis. Peripheral and notch osteophytes as well as the cruciate ligaments were then resected. We continued to  work our way around  posteriorly along the proximal tibia, and externally  rotated the tibia subluxing it out from underneath the femur. A McHale  retractor was placed through the notch and a lateral Hohmann retractor  placed, and we then drilled through the proximal tibia in line with the  axis of the tibia followed by an intramedullary guide rod and 2-degree  posterior slope cutting guide. The tibial cutting guide was pinned into place  allowing resection of 4 mm of bone medially and about 6 mm of bone  laterally because of her varus deformity. Satisfied with the tibial resection, we then  entered the distal femur 2 mm anterior to the PCL origin with the  intramedullary guide rod and applied the distal femoral cutting guide  set at 43mm, with 5 degrees of valgus. This was pinned along the  epicondylar axis. At this point, the distal femoral cut was accomplished without difficulty. We then sized for a #3 femoral component and pinned the  guide in 3 degrees of external rotation.The chamfer cutting guide was pinned into place. The anterior, posterior, and chamfer cuts were accomplished without difficulty followed by  the Sigma RP box cutting guide and the box cut. We also removed posterior osteophytes from the posterior femoral condyles. At this  time, the knee was brought into full extension. We checked our  extension and flexion gaps and found them symmetric at 50mm.  The patella thickness measured at 21 mm. We set the cutting guide at 12 and removed the posterior 8 mm  of the patella sized for 32 button and drilled the lollipop. The knee  was then once again hyperflexed exposing the proximal tibia. We sized for a #3 tibial base plate, applied the smokestack and the conical reamer followed by the the Delta fin keel punch. We then hammered into place the Sigma RP trial femoral component, inserted a 10-mm trial bearing, trial patellar button, and took the knee through range of motion from 0-130 degrees. No thumb pressure was required for patellar  tracking. At this point, all trial components were removed, a double batch of DePuy HV cement with  was mixed and applied to all bony metallic mating surfaces except for the posterior condyles of the femur itself. In order, we  hammered into place the tibial tray and removed excess cement, the femoral component and removed excess cement, a 10-mm Sigma RP bearing  was inserted, and the knee brought to full extension with compression.  The patellar button was clamped into place, and excess cement  removed. While the cement cured the wound was irrigated out with normal saline solution pulse lavage, and medium Hemovac drains were placed.. Ligament stability and patellar tracking were checked and found to be excellent. The tourniquet was then released and hemostasis was obtained with cautery. The parapatellar arthrotomy was closed with  #1 ethibond suture. The subcutaneous tissue with 0 and 2-0 undyed   Vicryl suture, and 4-0 Monocryl.. A dressing of Xeroform,  4 x 4, dressing sponges, Webril, and Ace wrap applied. Needle and sponge count were correct times 2.The patient awakened, extubated, and taken to recovery room without difficulty. Vascular status was normal, pulses 2+ and symmetric.   Diane Gregory A 05/04/2014, 10:50 AM  3

## 2014-05-04 NOTE — Progress Notes (Signed)
Utilization review completed.  

## 2014-05-04 NOTE — Progress Notes (Signed)
Orthopedic Tech Progress Note Patient Details:  [name] 05-15-1956 [id number]  Patient ID: Diane Gregory, female   DOB: 1955-11-19, 58 y.o.   MRN: [id number] Placed pt's rle in cpm @ 10-60 degrees@2050   Hildred Priest 05/04/2014, 8:55 PM

## 2014-05-04 NOTE — Progress Notes (Signed)
Patient off CPM machine and zero knee foam applied.

## 2014-05-04 NOTE — Transfer of Care (Signed)
Immediate Anesthesia Transfer of Care Note  Patient: Diane Gregory  Procedure(s) Performed: Procedure(s): RIGHT TOTAL KNEE ARTHROPLASTY (Right)  Patient Location: PACU  Anesthesia Type:General  Level of Consciousness: awake, oriented and patient cooperative  Airway & Oxygen Therapy: Patient Spontanous Breathing and Patient connected to face mask oxygen  Post-op Assessment: Report given to PACU RN and Post -op Vital signs reviewed and stable  Post vital signs: Reviewed  Complications: No apparent anesthesia complications

## 2014-05-04 NOTE — Progress Notes (Signed)
Orthopedic Tech Progress Note Patient Details:  Diane Gregory March 25, 1956 [id number] CPM applied to RLE with appropriate settings. OHF applied to bed. Footsie roll provided. CPM Right Knee CPM Right Knee: On Right Knee Flexion (Degrees): 60 Right Knee Extension (Degrees): 0   [name] 05/04/2014, 12:57 PM

## 2014-05-05 ENCOUNTER — Encounter (HOSPITAL_COMMUNITY): Payer: Self-pay | Admitting: Orthopedic Surgery

## 2014-05-05 LAB — CBC
HEMATOCRIT: 31.4 % — AB (ref 36.0–46.0)
HEMOGLOBIN: 10.4 g/dL — AB (ref 12.0–15.0)
MCH: 26.7 pg (ref 26.0–34.0)
MCHC: 33.1 g/dL (ref 30.0–36.0)
MCV: 80.7 fL (ref 78.0–100.0)
Platelets: 243 10*3/uL (ref 150–400)
RBC: 3.89 MIL/uL (ref 3.87–5.11)
RDW: 14.5 % (ref 11.5–15.5)
WBC: 17.4 10*3/uL — AB (ref 4.0–10.5)

## 2014-05-05 LAB — BASIC METABOLIC PANEL
Anion gap: 13 (ref 5–15)
BUN: 16 mg/dL (ref 6–23)
CO2: 27 mEq/L (ref 19–32)
Calcium: 8.9 mg/dL (ref 8.4–10.5)
Chloride: 94 mEq/L — ABNORMAL LOW (ref 96–112)
Creatinine, Ser: 0.64 mg/dL (ref 0.50–1.10)
GLUCOSE: 190 mg/dL — AB (ref 70–99)
POTASSIUM: 4.7 meq/L (ref 3.7–5.3)
SODIUM: 134 meq/L — AB (ref 137–147)

## 2014-05-05 LAB — GLUCOSE, CAPILLARY
Glucose-Capillary: 207 mg/dL — ABNORMAL HIGH (ref 70–99)
Glucose-Capillary: 232 mg/dL — ABNORMAL HIGH (ref 70–99)
Glucose-Capillary: 153 mg/dL — ABNORMAL HIGH (ref 70–99)

## 2014-05-05 MED ORDER — INFLUENZA VAC SPLIT QUAD 0.5 ML IM SUSY: 0.5000 mL | PREFILLED_SYRINGE | INTRAMUSCULAR | Status: DC

## 2014-05-05 MED ORDER — INFLUENZA VAC SPLIT QUAD 0.5 ML IM SUSY
0.5000 mL | PREFILLED_SYRINGE | INTRAMUSCULAR | Status: AC
  Administered 2014-05-06: 0.5 mL via INTRAMUSCULAR
  Filled 2014-05-05: qty 0.5

## 2014-05-05 NOTE — Progress Notes (Signed)
Subjective: 1 Day Post-Op Procedure(s) (LRB): RIGHT TOTAL KNEE ARTHROPLASTY (Right) Patient reports pain as 5 on 0-10 scale.    Objective: Vital signs in last 24 hours: Temp:  [97.3 F (36.3 C)-98.4 F (36.9 C)] 97.9 F (36.6 C) (09/22 0730) Pulse Rate:  [67-81] 69 (09/22 0730) Resp:  [14-21] 18 (09/22 0730) BP: (96-141)/(49-73) 117/56 mmHg (09/22 0730) SpO2:  [90 %-96 %] 92 % (09/22 0730)  Intake/Output from previous day: 09/21 0701 - 09/22 0700 In: 3840 [P.O.:540; I.V.:2000] Out: 2680 [Urine:2550; Drains:130] Intake/Output this shift:     Recent Labs  05/05/14 0452  HGB 10.4*    Recent Labs  05/05/14 0452  WBC 17.4*  RBC 3.89  HCT 31.4*  PLT 243    Recent Labs  05/05/14 0452  NA 134*  K 4.7  CL 94*  CO2 27  BUN 16  CREATININE 0.64  GLUCOSE 190*  CALCIUM 8.9   No results found for this basename: LABPT, INR,  in the last 72 hours  ABD soft Neurovascular intact Sensation intact distally Intact pulses distally Dorsiflexion/Plantar flexion intact Incision: scant drainage  Knee buckles with ambulation in the knee immobilizer.  Will keep until tomorrow  Assessment/Plan: 1 Day Post-Op Procedure(s) (LRB): RIGHT TOTAL KNEE ARTHROPLASTY (Right) Advance diet Up with therapy D/C IV fluids Plan for discharge tomorrow Discharge home with home health  Linda Hedges 05/05/2014, 9:14 AM

## 2014-05-05 NOTE — Progress Notes (Signed)
Physical Therapy Treatment Patient Details Name: Diane Gregory MRN: [id number] DOB: 06/25/1956 Today's Date: 05/05/2014    History of Present Illness 58 yo F s/p R TKA    PT Comments    Pt making progress with mobility.  Nerve block likely still in affect as pt with very poor quad strength on right.   Follow Up Recommendations  Home health PT;Supervision for mobility/OOB     Equipment Recommendations  None recommended by PT    Recommendations for Other Services       Precautions / Restrictions Precautions Precautions: Knee Required Braces or Orthoses: Knee Immobilizer - Right Knee Immobilizer - Right: On when out of bed or walking Restrictions Weight Bearing Restrictions: Yes RLE Weight Bearing: Weight bearing as tolerated    Mobility  Bed Mobility Overal bed mobility:  (NT. Pt up in chair)                Transfers Overall transfer level: Needs assistance Equipment used: Rolling walker (2 wheeled) Transfers: Sit to/from Stand Sit to Stand: Min assist         General transfer comment: requires physical assist. Practiced from recliner and  used BSC during session  Ambulation/Gait Ambulation/Gait assistance: Min assist Ambulation Distance (Feet): 30 Feet Assistive device: Rolling walker (2 wheeled) Gait Pattern/deviations: Step-to pattern;Antalgic;Decreased stride length   Gait velocity interpretation: Below normal speed for age/gender General Gait Details: improved from yesterday.  Requires KI on RLE to prevent buckling   Stairs            Wheelchair Mobility    Modified Rankin (Stroke Patients Only)       Balance                                    Cognition Arousal/Alertness: Awake/alert Behavior During Therapy: WFL for tasks assessed/performed Overall Cognitive Status: Within Functional Limits for tasks assessed                      Exercises Total Joint Exercises Ankle Circles/Pumps: AROM;Both;10  reps;Supine Quad Sets: AROM;Right;10 reps;Supine Hip ABduction/ADduction: AAROM;Right;10 reps;Supine Long Arc Quad: AAROM;5 reps;Seated;Other (comment) (minimal quad strength against gravity) Knee Flexion: AAROM;Right;Seated Goniometric ROM: 73 degrees PROM    General Comments General comments (skin integrity, edema, etc.): No family/visitors present. Pt anticipates DC home tomorrow      Pertinent Vitals/Pain Pain Assessment: 0-10 Pain Score: 8  Pain Location: R knee Pain Intervention(s): Limited activity within patient's tolerance;Monitored during session;Premedicated before session;Repositioned    Home Living                      Prior Function            PT Goals (current goals can now be found in the care plan section) Progress towards PT goals: Progressing toward goals    Frequency  7X/week    PT Plan Current plan remains appropriate    Co-evaluation             End of Session Equipment Utilized During Treatment: Gait belt;Right knee immobilizer Activity Tolerance: Patient tolerated treatment well Patient left: in chair;with call bell/phone within reach     Time: 0957-1036 PT Time Calculation (min): 39 min  Charges:  $Gait Training: 23-37 mins $Therapeutic Exercise: 8-22 mins                    G  Codes:      Melvern Banker 05/05/2014, 10:50 AM Lavonia Dana, PT  [phone] 05/05/2014

## 2014-05-05 NOTE — Discharge Instructions (Signed)

## 2014-05-05 NOTE — Progress Notes (Signed)
Physical Therapy Treatment Patient Details Name: Diane Gregory MRN: [id number] DOB: November 07, 1955 Today's Date: 05/05/2014    History of Present Illness 58 yo F s/p R TKA    PT Comments    Continues to progress, however still with significant R quad weakness. Pt pleased with her progress and is anticipating DC to home tomorrow. Recommend pt continue to use knee immobilizer on RLE until quad strength improved.    Follow Up Recommendations  Home health PT;Supervision for mobility/OOB     Equipment Recommendations  None recommended by PT    Recommendations for Other Services       Precautions / Restrictions Precautions Precautions: Knee Required Braces or Orthoses: Knee Immobilizer - Right Knee Immobilizer - Right: On when out of bed or walking Restrictions Weight Bearing Restrictions: Yes RLE Weight Bearing: Weight bearing as tolerated    Mobility  Bed Mobility Overal bed mobility: Modified Independent Bed Mobility: Supine to Sit;Sit to Supine     Supine to sit: Modified independent (Device/Increase time) (used rail, bed flat) Sit to supine: Modified independent (Device/Increase time) (used rail)      Transfers Overall transfer level: Needs assistance Equipment used: Rolling walker (2 wheeled) Transfers: Sit to/from Stand Sit to Stand: Min guard         General transfer comment: Practiced several times during session  Ambulation/Gait Ambulation/Gait assistance: Min guard Ambulation Distance (Feet): 40 Feet Assistive device: Rolling walker (2 wheeled) Gait Pattern/deviations: Step-to pattern;Decreased stride length;Antalgic   Gait velocity interpretation: Below normal speed for age/gender General Gait Details: Pt demonstrated proper gait sequencing   Stairs            Wheelchair Mobility    Modified Rankin (Stroke Patients Only)       Balance                                    Cognition Arousal/Alertness: Awake/alert Behavior  During Therapy: WFL for tasks assessed/performed Overall Cognitive Status: Within Functional Limits for tasks assessed                      Exercises Total Joint Exercises Ankle Circles/Pumps: AROM;Both;10 reps;Supine Quad Sets: AROM;Right;10 reps;Supine Short Arc Quad: AAROM;Right;10 reps;Supine Heel Slides: AAROM;Right;10 reps;Supine Hip ABduction/ADduction: AAROM;Right;10 reps;Supine Straight Leg Raises: AAROM;Right;10 reps;Supine;Other (comment) (Significant extension lag) Long Arc Quad:  (minimal quad strength against gravity) Knee Flexion: AAROM;Right;Seated Goniometric ROM: 73 degrees PROM    General Comments General comments (skin integrity, edema, etc.): No family/visitors present. Pt anticipates DC home tomorrow      Pertinent Vitals/Pain Pain Assessment: 0-10 Pain Score: 0-No pain Pain Location: R knee Pain Intervention(s): Other (comment);Premedicated before session (At rest pt reports no pain. Did have some pain with exercise)    Home Living                      Prior Function            PT Goals (current goals can now be found in the care plan section) Progress towards PT goals: Progressing toward goals    Frequency  7X/week    PT Plan Current plan remains appropriate    Co-evaluation             End of Session Equipment Utilized During Treatment: Gait belt;Right knee immobilizer Activity Tolerance: Patient tolerated treatment well Patient left: in chair;with call bell/phone within reach  Time: 5170-0174 PT Time Calculation (min): 27 min  Charges:  $Gait Training: 8-22 mins $Therapeutic Exercise: 8-22 mins                    G Codes:      Melvern Banker 06-May-2014, 2:34 PM Lavonia Dana, PT  [phone] 2014/05/06

## 2014-05-06 LAB — CBC
HEMATOCRIT: 30.7 % — AB (ref 36.0–46.0)
HEMOGLOBIN: 9.9 g/dL — AB (ref 12.0–15.0)
MCH: 26.6 pg (ref 26.0–34.0)
MCHC: 32.2 g/dL (ref 30.0–36.0)
MCV: 82.5 fL (ref 78.0–100.0)
Platelets: 231 10*3/uL (ref 150–400)
RBC: 3.72 MIL/uL — AB (ref 3.87–5.11)
RDW: 14.8 % (ref 11.5–15.5)
WBC: 13.8 10*3/uL — ABNORMAL HIGH (ref 4.0–10.5)

## 2014-05-06 LAB — BASIC METABOLIC PANEL
Anion gap: 13 (ref 5–15)
BUN: 21 mg/dL (ref 6–23)
CALCIUM: 8.7 mg/dL (ref 8.4–10.5)
CO2: 28 meq/L (ref 19–32)
CREATININE: 0.75 mg/dL (ref 0.50–1.10)
Chloride: 95 mEq/L — ABNORMAL LOW (ref 96–112)
GFR calc Af Amer: >90 mL/min (ref >90)
GLUCOSE: 141 mg/dL — AB (ref 70–99)
Potassium: 4.1 mEq/L (ref 3.7–5.3)
Sodium: 136 mEq/L — ABNORMAL LOW (ref 137–147)

## 2014-05-06 LAB — GLUCOSE, CAPILLARY
GLUCOSE-CAPILLARY: 174 mg/dL — AB (ref 70–99)
Glucose-Capillary: 142 mg/dL — ABNORMAL HIGH (ref 70–99)
Glucose-Capillary: 101 mg/dL — ABNORMAL HIGH (ref 70–99)

## 2014-05-06 MED ORDER — POLYETHYLENE GLYCOL 3350 17 G PO PACK: 17.0000 g | PACK | Freq: Two times a day (BID) | ORAL | Status: DC

## 2014-05-06 MED ORDER — OXYCODONE HCL 5 MG PO TABS: ORAL_TABLET | ORAL | Status: DC

## 2014-05-06 MED ORDER — DSS 100 MG PO CAPS: 100.0000 mg | ORAL_CAPSULE | Freq: Two times a day (BID) | ORAL | Status: DC

## 2014-05-06 NOTE — Progress Notes (Signed)
[name] to be D/C'd Home per MD order. Discussed with the patient and all questions fully answered.    Medication List         aspirin EC 325 MG tablet  Take 325 mg by mouth daily.     atorvastatin 10 MG tablet  Commonly known as:  LIPITOR  Take 10 mg by mouth daily.     diltiazem 240 MG 24 hr capsule  Commonly known as:  CARDIZEM CD  Take 240 mg by mouth daily.     DSS 100 MG Caps  Take 100 mg by mouth 2 (two) times daily.     fenofibrate 145 MG tablet  Commonly known as:  TRICOR  Take 145 mg by mouth daily.     glipiZIDE 5 MG tablet  Commonly known as:  GLUCOTROL  Take 5 mg by mouth 2 (two) times daily before a meal.     losartan-hydrochlorothiazide 100-25 MG per tablet  Commonly known as:  HYZAAR  Take 1 tablet by mouth daily.     metFORMIN 500 MG tablet  Commonly known as:  GLUCOPHAGE  Take 500 mg by mouth 2 (two) times daily with a meal.     multivitamin with minerals Tabs tablet  Take 1 tablet by mouth daily.     omeprazole 20 MG capsule  Commonly known as:  PRILOSEC  Take 20 mg by mouth daily.     oxyCODONE 5 MG immediate release tablet  Commonly known as:  Oxy IR/ROXICODONE  1-2 tablets every 4-6 hrs as needed for pain     PARoxetine 40 MG tablet  Commonly known as:  PAXIL  Take 40 mg by mouth every morning.     polyethylene glycol packet  Commonly known as:  MIRALAX / GLYCOLAX  Take 17 g by mouth 2 (two) times daily.     pramipexole 1 MG tablet  Commonly known as:  MIRAPEX  Take 1 mg by mouth at bedtime.     solifenacin 10 MG tablet  Commonly known as:  VESICARE  Take 10 mg by mouth daily.     valACYclovir 1000 MG tablet  Commonly known as:  VALTREX  Take 1,000 mg by mouth as needed (out break).     ZOVIRAX 5 %  Generic drug:  acyclovir cream  Apply 1 application topically every 3 (three) hours as needed (out break).        VVS, Skin clean, dry and intact without evidence of skin break down, no evidence of skin tears noted.  IV  catheter discontinued intact. Site without signs and symptoms of complications. Dressing and pressure applied.  An After Visit Summary was printed and given to the patient.  Patient escorted via [place], and D/C home via private auto.  Cyndra Numbers  05/06/2014 4:37 PM

## 2014-05-06 NOTE — Discharge Summary (Signed)
Patient ID: Diane Gregory MRN: [id number] DOB/AGE: 1956/04/06 58 y.o.  Admit date: 05/04/2014 Discharge date: 05/06/2014  Admission Diagnoses:  Principal Problem:   Primary localized osteoarthritis of right knee Active Problems:   Diabetes mellitus type 2, controlled, without complications   Hypertension   Restless leg syndrome   GERD (gastroesophageal reflux disease)   High triglycerides   Hyperlipidemia   Sleep apnea   DJD (degenerative joint disease) of knee   S/P left total knee replacement   Right knee DJD   Discharge Diagnoses:  Same  Past Medical History  Diagnosis Date  . Hypertension   . Restless leg syndrome   . Urinary incontinence   . Anxiety   . GERD (gastroesophageal reflux disease)   . High triglycerides   . Hyperlipidemia   . Depression   . Sleep apnea     mild; does not wear CPAP  . Urgency of urination     and frequency takes Vesicare  . Chronic bronchitis     "used to get it q yr; not as much since septal repair in 2004"  . Walking pneumonia ~ 2013  . Type II diabetes mellitus   . Left knee DJD   . Primary localized osteoarthritis of right knee   . Arthritis     "was in my knees before OR; now in my hands" (05/05/2014)    Surgeries: Procedure(s): RIGHT TOTAL KNEE ARTHROPLASTY on 05/04/2014   Consultants:    Discharged Condition: Improved  Hospital Course: Diane Gregory is an 58 y.o. female who was admitted 05/04/2014 for operative treatment ofPrimary localized osteoarthritis of right knee. Patient has severe unremitting pain that affects sleep, daily activities, and work/hobbies. After pre-op clearance the patient was taken to the operating room on 05/04/2014 and underwent  Procedure(s): RIGHT TOTAL KNEE ARTHROPLASTY.    Patient was given perioperative antibiotics: Anti-infectives   Start     Dose/Rate Route Frequency Ordered Stop   05/04/14 1500  clindamycin (CLEOCIN) IVPB 600 mg     600 mg 100 mL/hr over 30 Minutes Intravenous Every 6  hours 05/04/14 1400 05/04/14 2224   05/04/14 1359  valACYclovir (VALTREX) tablet 1,000 mg     1,000 mg Oral As needed 05/04/14 1400     05/04/14 0815  clindamycin (CLEOCIN) IVPB 900 mg     900 mg 100 mL/hr over 30 Minutes Intravenous On call to O.R. 05/04/14 0802 05/04/14 0904       Patient was given sequential compression devices, early ambulation, and chemoprophylaxis to prevent DVT.  Patient benefited maximally from hospital stay and there were no complications.    Recent vital signs: Patient Vitals for the past 24 hrs:  BP Temp Pulse Resp SpO2  05/06/14 0507 116/53 mmHg 97.9 F (36.6 C) 66 18 93 %  05/05/14 2150 118/56 mmHg 98 F (36.7 C) 62 18 91 %  05/05/14 1329 107/52 mmHg 98.1 F (36.7 C) 70 20 93 %  05/05/14 0943 114/55 mmHg - - - -     Recent laboratory studies:  Recent Labs  05/05/14 0452 05/06/14 0540  WBC 17.4* 13.8*  HGB 10.4* 9.9*  HCT 31.4* 30.7*  PLT 243 231  NA 134* 136*  K 4.7 4.1  CL 94* 95*  CO2 27 28  BUN 16 21  CREATININE 0.64 0.75  GLUCOSE 190* 141*  CALCIUM 8.9 8.7     Discharge Medications:     Medication List         aspirin EC 325 MG  tablet  Take 325 mg by mouth daily.     atorvastatin 10 MG tablet  Commonly known as:  LIPITOR  Take 10 mg by mouth daily.     diltiazem 240 MG 24 hr capsule  Commonly known as:  CARDIZEM CD  Take 240 mg by mouth daily.     DSS 100 MG Caps  Take 100 mg by mouth 2 (two) times daily.     fenofibrate 145 MG tablet  Commonly known as:  TRICOR  Take 145 mg by mouth daily.     glipiZIDE 5 MG tablet  Commonly known as:  GLUCOTROL  Take 5 mg by mouth 2 (two) times daily before a meal.     losartan-hydrochlorothiazide 100-25 MG per tablet  Commonly known as:  HYZAAR  Take 1 tablet by mouth daily.     metFORMIN 500 MG tablet  Commonly known as:  GLUCOPHAGE  Take 500 mg by mouth 2 (two) times daily with a meal.     multivitamin with minerals Tabs tablet  Take 1 tablet by mouth daily.      omeprazole 20 MG capsule  Commonly known as:  PRILOSEC  Take 20 mg by mouth daily.     oxyCODONE 5 MG immediate release tablet  Commonly known as:  Oxy IR/ROXICODONE  1-2 tablets every 4-6 hrs as needed for pain     PARoxetine 40 MG tablet  Commonly known as:  PAXIL  Take 40 mg by mouth every morning.     polyethylene glycol packet  Commonly known as:  MIRALAX / GLYCOLAX  Take 17 g by mouth 2 (two) times daily.     pramipexole 1 MG tablet  Commonly known as:  MIRAPEX  Take 1 mg by mouth at bedtime.     solifenacin 10 MG tablet  Commonly known as:  VESICARE  Take 10 mg by mouth daily.     valACYclovir 1000 MG tablet  Commonly known as:  VALTREX  Take 1,000 mg by mouth as needed (out break).     ZOVIRAX 5 %  Generic drug:  acyclovir cream  Apply 1 application topically every 3 (three) hours as needed (out break).        Diagnostic Studies: No results found.  Disposition: 06-Home-Health Care Svc      Discharge Instructions   CPM    Complete by:  As directed   Continuous passive motion machine (CPM):      Use the CPM from 0 to 90 for 6 hours per day.       You may break it up into 2 or 3 sessions per day.      Use CPM for 2 weeks or until you are told to stop.     Call MD / Call 911    Complete by:  As directed   If you experience chest pain or shortness of breath, CALL 911 and be transported to the hospital emergency room.  If you develope a fever above 101 F, pus (white drainage) or increased drainage or redness at the wound, or calf pain, call your surgeon's office.     Change dressing    Complete by:  As directed   Change the dressing daily with sterile 4 x 4 inch gauze dressing and apply TED hose.  You may clean the incision with alcohol prior to redressing.     Constipation Prevention    Complete by:  As directed   Drink plenty of fluids.  Prune juice may be  helpful.  You may use a stool softener, such as Colace (over the counter) 100 mg twice a day.  Use  MiraLax (over the counter) for constipation as needed.     Diet - low sodium heart healthy    Complete by:  As directed      Discharge instructions    Complete by:  As directed   Total Knee Replacement Care After Refer to this sheet in the next few weeks. These discharge instructions provide you with general information on caring for yourself after you leave the hospital. Your caregiver may also give you specific instructions. Your treatment has been planned according to the most current medical practices available, but unavoidable complications sometimes occur. If you have any problems or questions after discharge, please call your caregiver. Regaining a near full range of motion of your knee within the first 3 to 6 weeks after surgery is critical. [place] may resume a normal diet and activities as directed.  Perform exercises as directed.  Place gray foam block, curve side up under heel at all times except when in CPM or when walking.  DO NOT modify, tear, cut, or change in any way the gray foam block. You will receive physical therapy daily  Take showers instead of baths until informed otherwise.  You may shower on Sunday.  Please wash whole leg including wound with soap and water  Do not remove bandage over the wound.  Wash over it and the whole leg every day with soap and water It is OK to take over-the-counter tylenol in addition to the oxycodone for pain, discomfort, or fever. Oxycodone is VERY constipating.  Please take stool softener twice a day and laxatives daily until bowels are regular Eat a well-balanced diet.  Avoid lifting or driving until you are instructed otherwise.  Make an appointment to see your caregiver for stitches (suture) or staple removal as directed.  If you have been sent home with a continuous passive motion machine (CPM machine), 0-90 degrees 6 hrs a day   2 hrs a shift SEEK MEDICAL CARE IF: You have swelling of your calf or leg.  You develop  shortness of breath or chest pain.  You have redness, swelling, or increasing pain in the wound.  There is pus or any unusual drainage coming from the surgical site.  You notice a bad smell coming from the surgical site or dressing.  The surgical site breaks open after sutures or staples have been removed.  There is persistent bleeding from the suture or staple line.  You are getting worse or are not improving.  You have any other questions or concerns.  SEEK IMMEDIATE MEDICAL CARE IF:  You have a fever.  You develop a rash.  You have difficulty breathing.  You develop any reaction or side effects to medicines given.  Your knee motion is decreasing rather than improving.  MAKE SURE YOU:  Understand these instructions.  Will watch your condition.  Will get help right away if you are not doing well or get worse.     Do not put a pillow under the knee. Place it under the heel.    Complete by:  As directed   Place gray foam block, curve side up under heel at all times except when in CPM or when walking.  DO NOT modify, tear, cut, or change in any way the gray foam block.     Increase activity slowly as tolerated    Complete  by:  As directed      TED hose    Complete by:  As directed   Use stockings (TED hose) for 2 weeks on both leg(s).  You may remove them at night for sleeping.           Follow-up Information   Follow up with Lorn Junes, MD On 05/18/2014. (appt time at 2:30pm)    Specialty:  Orthopedic Surgery   Contact information:   [address] [place] [address] [phone]        Signed: Linda Hedges 05/06/2014, 8:02 AM

## 2014-05-06 NOTE — Progress Notes (Addendum)
Physical Therapy Treatment Patient Details Name: Diane Gregory MRN: [id number] DOB: [birth year] Today's Date: 05/06/2014    History of Present Illness 58 yo F s/p R TKA    PT Comments    Making good progress, although has more pain.  Pt anticipates DC home after lunch.   Follow Up Recommendations  Home health PT;Supervision for mobility/OOB     Equipment Recommendations  None recommended by PT    Recommendations for Other Services       Precautions / Restrictions Precautions Precautions: Knee Required Braces or Orthoses: Knee Immobilizer - Right Knee Immobilizer - Right: On when out of bed or walking Restrictions Weight Bearing Restrictions: Yes RLE Weight Bearing: Weight bearing as tolerated    Mobility  Bed Mobility Overal bed mobility: Modified Independent Bed Mobility: Supine to Sit;Sit to Supine     Supine to sit: Modified independent (Device/Increase time) (used rail, bed flat) Sit to supine: Modified independent (Device/Increase time) (used rail)   General bed mobility comments: Able to get in and out of bed awith and without KI  Transfers Overall transfer level: Needs assistance Equipment used: Rolling walker (2 wheeled) Transfers: Sit to/from Stand Sit to Stand: Min guard         General transfer comment: Practiced several times during session. Pt demonstrated safe technique. Transfers were a bit more diffcicult for pt today due to increased pain.   Ambulation/Gait Ambulation/Gait assistance: Supervision Ambulation Distance (Feet): 70 Feet (also 10 feet to BR (this short walk was without KI) No buckl) Assistive device: Rolling walker (2 wheeled) Gait Pattern/deviations: Step-to pattern;Decreased stride length;Antalgic Gait velocity: slow, but improving each day Gait velocity interpretation: Below normal speed for age/gender General Gait Details: Pt demonstrated proper gait sequencing. Pt had walked much further this AM already with  PA.   Stairs Stairs: Yes Stairs assistance: Min guard Stair Management: Forwards;With walker Number of Stairs: 1 General stair comments: practiced twice. Pt able to demonstrate proper technique after instruction. Advised pt to use KI for stairs  Wheelchair Mobility    Modified Rankin (Stroke Patients Only)       Balance                                    Cognition Arousal/Alertness: Awake/alert Behavior During Therapy: WFL for tasks assessed/performed Overall Cognitive Status: Within Functional Limits for tasks assessed                      Exercises Total Joint Exercises Ankle Circles/Pumps: AROM;Both;10 reps;Supine Quad Sets: AROM;Right;10 reps;Supine Heel Slides: AAROM;Right;10 reps;Supine;5 reps Hip ABduction/ADduction: Right;10 reps;Supine;AROM Straight Leg Raises: Right;10 reps;Supine;Other (comment);AROM Long Arc Quad: AAROM;Right;10 reps;Seated (improved quad strength) Knee Flexion: AAROM;Right;Seated Goniometric ROM: 68 degrees flexion in sitting    General Comments General comments (skin integrity, edema, etc.): Pt used bathroom during session. Increased pain today, but much improved quad strenth on RLE      Pertinent Vitals/Pain Pain Assessment: 0-10 Pain Score: 10-Worst pain ever Pain Location: R knee  and upper, inner R thigh Pain Intervention(s): Limited activity within patient's tolerance;Monitored during session;Premedicated before session;Repositioned;Other (comment) (During session pain decreased to 5/10. 10/10 was at start.)    Home Living                      Prior Function            PT Goals (current goals  can now be found in the care plan section) Progress towards PT goals: Progressing toward goals    Frequency  7X/week    PT Plan Current plan remains appropriate    Co-evaluation             End of Session Equipment Utilized During Treatment: Gait belt;Right knee immobilizer Activity  Tolerance: Patient tolerated treatment well Patient left: in bed in CPM;with call bell/phone within reach     Time: 4239-5320 PT Time Calculation (min): 47 min  Charges:  $Gait Training: 23-37 mins $Therapeutic Exercise: 8-22 mins                    G Codes:      Melvern Banker 05/15/2014, 10:45 AM Lavonia Dana, PT  [phone] 2014-05-15

## 2014-05-06 NOTE — Evaluation (Addendum)
Occupational Therapy Evaluation Patient Details Name: Diane Gregory MRN: [id number] DOB: [birth year] Today's Date: 05/06/2014    History of Present Illness 58 yo F s/p R TKA   Clinical Impression   Pt admitted with the above diagnoses and presents with below problem list. Pt will benefit from continued acute OT to address the below listed deficits and maximize independence with basic ADLs prior to d/c home with family. PTA pt was independent with ADLs. Pt currently at min guard level for LB ADLs and functional mobility/transfers.      Follow Up Recommendations  Supervision/Assistance - 24 hour;No OT follow up    Equipment Recommendations  None recommended by OT    Recommendations for Other Services       Precautions / Restrictions Precautions Precautions: Knee Precaution Comments: reviewed precautions Required Braces or Orthoses: Knee Immobilizer - Right Knee Immobilizer - Right: On when out of bed or walking Restrictions Weight Bearing Restrictions: Yes RLE Weight Bearing: Weight bearing as tolerated      Mobility Bed Mobility    General bed mobility comments: NT   Transfers               Balance                                            ADL Overall ADL's : Needs assistance/impaired Eating/Feeding: Set up;Sitting   Grooming: Set up;Sitting   Upper Body Bathing: Set up;Sitting   Lower Body Bathing: Min guard;Sit to/from stand;With adaptive equipment   Upper Body Dressing : Set up;Sitting   Lower Body Dressing: Min guard;Sit to/from stand;With adaptive equipment   Toilet Transfer: Min guard;Ambulation;RW;BSC   Toileting- Water quality scientist and Hygiene: Min guard;Sit to/from stand   Tub/ Shower Transfer: Min guard;Ambulation;3 in 1;Rolling walker   Functional mobility during ADLs: Min guard;Rolling walker General ADL Comments: Pt declined OOB due to just starting CPM and pain with pt stating she will be going home after she is  finished with CPM. Per PT note pt is at min guard level for transfers and ambulation. Educated on techniques and AE for safe completion of ADLs with knee precautions. Pt recent h/o L knee surgery with good recall of techniques.      Vision                     Perception     Praxis      Pertinent Vitals/Pain Pain Assessment: 0-10 Pain Score: 9  Pain Location: R knee Pain Descriptors / Indicators: Aching Pain Intervention(s): Limited activity within patient's tolerance     Hand Dominance Right   Extremity/Trunk Assessment Upper Extremity Assessment Upper Extremity Assessment: Overall WFL for tasks assessed   Lower Extremity Assessment Lower Extremity Assessment: Defer to PT evaluation       Communication Communication Communication: No difficulties   Cognition Arousal/Alertness: Awake/alert Behavior During Therapy: WFL for tasks assessed/performed Overall Cognitive Status: Within Functional Limits for tasks assessed                     General Comments       Exercises      Shoulder Instructions      Home Living Family/patient expects to be discharged to:: Private residence Living Arrangements: Alone Available Help at Discharge: Family;Available 24 hours/day;Friend(s) Type of Home: House Home Access: Stairs to enter [employer] of  Steps: 1 Entrance Stairs-Rails: None Home Layout: One level     Bathroom Shower/Tub: Occupational psychologist: Standard Bathroom Accessibility: Yes How Accessible: Accessible via walker Home Equipment: Bedside commode;Walker - 2 wheels   Additional Comments: has CPM at home      Prior Functioning/Environment Level of Independence: Independent             OT Diagnosis: Acute pain   OT Problem List: Decreased knowledge of use of DME or AE;Decreased knowledge of precautions;Pain;Impaired balance (sitting and/or standing)   OT Treatment/Interventions: Self-care/ADL training;Therapeutic  exercise;DME and/or AE instruction;Therapeutic activities;Patient/family education;Balance training    OT Goals(Current goals can be found in the care plan section) Acute Rehab OT Goals Patient Stated Goal: not stated OT Goal Formulation: With patient Time For Goal Achievement: 05/13/14 Potential to Achieve Goals: Good ADL Goals Pt Will Perform Lower Body Bathing: with modified independence;with adaptive equipment;sit to/from stand Pt Will Perform Lower Body Dressing: with modified independence;with adaptive equipment;sit to/from stand Pt Will Transfer to Toilet: with modified independence;ambulating;bedside commode Pt Will Perform Toileting - Clothing Manipulation and hygiene: with modified independence;with adaptive equipment;sit to/from stand Pt Will Perform Tub/Shower Transfer: with modified independence;ambulating;3 in 1;rolling walker  OT Frequency: Min 2X/week   Barriers to D/C:            Co-evaluation              End of Session CPM Right Knee CPM Right Knee: On  Activity Tolerance:   Patient left: in bed;in CPM;with call bell/phone within reach   Time: 1058-1109 OT Time Calculation (min): 11 min Charges:  OT General Charges $OT Visit: 1 Procedure OT Evaluation $Initial OT Evaluation Tier I: 1 Procedure G-Codes:    Hortencia Pilar 05-25-2014, 11:20 AM

## 2014-05-06 NOTE — Progress Notes (Signed)
Patient will pick up discharge prescriptions from office on 05/07/14. PA is aware.

## 2014-05-11 ENCOUNTER — Other Ambulatory Visit (HOSPITAL_COMMUNITY): Payer: Self-pay | Admitting: Cardiology

## 2014-05-11 ENCOUNTER — Ambulatory Visit (HOSPITAL_COMMUNITY): Payer: 59 | Attending: Cardiovascular Disease | Admitting: Cardiology

## 2014-05-11 DIAGNOSIS — I10 Essential (primary) hypertension: Secondary | ICD-10-CM | POA: Diagnosis not present

## 2014-05-11 DIAGNOSIS — M7989 Other specified soft tissue disorders: Secondary | ICD-10-CM

## 2014-05-11 DIAGNOSIS — E785 Hyperlipidemia, unspecified: Secondary | ICD-10-CM | POA: Diagnosis not present

## 2014-05-11 DIAGNOSIS — M79609 Pain in unspecified limb: Secondary | ICD-10-CM | POA: Diagnosis not present

## 2014-05-11 DIAGNOSIS — M79604 Pain in right leg: Secondary | ICD-10-CM

## 2014-05-11 DIAGNOSIS — E119 Type 2 diabetes mellitus without complications: Secondary | ICD-10-CM | POA: Diagnosis not present

## 2014-05-11 NOTE — Progress Notes (Signed)
Rt. LE Venous duplex performed. 

## 2014-05-25 ENCOUNTER — Telehealth: Payer: Self-pay | Admitting: Obstetrics and Gynecology

## 2014-05-25 NOTE — Telephone Encounter (Signed)
LMTCB about a canceled appointment with Dr.Silva

## 2014-06-02 NOTE — Telephone Encounter (Signed)
Pt called back and schedule.

## 2014-06-15 ENCOUNTER — Encounter (HOSPITAL_COMMUNITY): Payer: Self-pay | Admitting: Orthopedic Surgery

## 2014-08-21 ENCOUNTER — Encounter: Payer: Self-pay | Admitting: Internal Medicine

## 2014-10-21 ENCOUNTER — Ambulatory Visit: Payer: Self-pay | Admitting: Obstetrics and Gynecology

## 2014-10-21 ENCOUNTER — Ambulatory Visit (INDEPENDENT_AMBULATORY_CARE_PROVIDER_SITE_OTHER): Payer: 59 | Admitting: Obstetrics and Gynecology

## 2014-10-21 ENCOUNTER — Encounter: Payer: Self-pay | Admitting: Obstetrics and Gynecology

## 2014-10-21 VITALS — BP 130/70 | HR 80 | Resp 14 | Ht 64.0 in | Wt 243.8 lb

## 2014-10-21 DIAGNOSIS — Z01419 Encounter for gynecological examination (general) (routine) without abnormal findings: Secondary | ICD-10-CM

## 2014-10-21 DIAGNOSIS — N3946 Mixed incontinence: Secondary | ICD-10-CM

## 2014-10-21 DIAGNOSIS — Z0000 Encounter for general adult medical examination without abnormal findings: Secondary | ICD-10-CM | POA: Diagnosis not present

## 2014-10-21 LAB — POCT URINALYSIS DIPSTICK
BILIRUBIN UA: NEGATIVE
Blood, UA: NEGATIVE
Glucose, UA: NEGATIVE
Ketones, UA: NEGATIVE
Leukocytes, UA: NEGATIVE
Nitrite, UA: NEGATIVE
PH UA: 5
PROTEIN UA: NEGATIVE
UROBILINOGEN UA: NEGATIVE

## 2014-10-21 NOTE — Progress Notes (Signed)
Patient ID: Diane Gregory, female   DOB: 02-06-1956, 59 y.o.   MRN: [id number] 58 y.o. [id number] SingleCaucasianF here for annual exam.    Taking Valtrex and Vesicare.  History of HSV.  Rare outbreaks of fever blisters.  Does not need refills of Valtrex.  History of urinary incontinence.  Vesicare helps.  Leaks with standing as she develops urge and if has pain.  Leaks with cough or laugh.  Knows she needs to loose weight. Wears Depends.  Did not see physical therapy.  Wants a referral.   Has had both knees replaced.   PCP:  Establishing with [facility]--has upcoming appt.  No LMP recorded. Patient has had a hysterectomy.          Sexually active: Yes.  female partner  The current method of family planning is status post hysterectomy.    Exercising: No.  none. Smoker:  no  Health Maintenance: Pap:  02-18-99 wnl:no HPV testing History of abnormal Pap:  no MMG:  09-17-14 fatty breasts/nl:[facility]: 05/2005 wnl with Dr. Scarlette Shorts.  Due this year.  BMD:   10 years normal.   TDaP:  2008 Screening Labs:  Hgb: done with Endocrinologist.  Urine:  Negative.   reports that she has never smoked. She has never used smokeless tobacco. She reports that she drinks alcohol. She reports that she does not use illicit drugs.  Past Medical History  Diagnosis Date  . Hypertension   . Restless leg syndrome   . Urinary incontinence   . Anxiety   . GERD (gastroesophageal reflux disease)   . High triglycerides   . Hyperlipidemia   . Depression   . Sleep apnea     mild; does not wear CPAP  . Urgency of urination     and frequency takes Vesicare  . Chronic bronchitis     "used to get it q yr; not as much since septal repair in 2004"  . Walking pneumonia ~ 2013  . Type II diabetes mellitus   . Left knee DJD   . Primary localized osteoarthritis of right knee   . Arthritis     "was in my knees before OR; now in my hands" (05/05/2014)  . HSV infection     Past Surgical History  Procedure  Laterality Date  . Uvulopalatopharyngoplasty, tonsillectomy and septoplasty  2004  . Knee arthroscopy w/ meniscectomy Right 2010  . Tonsillectomy  ~ 2004  . Refractive surgery Bilateral ~ 2005  . Colonoscopy    . Total knee arthroplasty Left 08/11/2013    Procedure: LEFT TOTAL KNEE ARTHROPLASTY;  Surgeon: Lorn Junes, MD;  Location: [place];  Service: Orthopedics;  Laterality: Left;  . Hysteroscopy  11/94    submuous myomas  . Total knee arthroplasty Right 05/04/2014    Procedure: RIGHT TOTAL KNEE ARTHROPLASTY;  Surgeon: Lorn Junes, MD;  Location: [place];  Service: Orthopedics;  Laterality: Right;  . Knee arthroscopy Right ~ 2010  . Vaginal hysterectomy  06/1993    Current Outpatient Prescriptions  Medication Sig Dispense Refill  . acyclovir cream (ZOVIRAX) 5 % Apply 1 application topically every 3 (three) hours as needed (out break).    Marland Kitchen aspirin 81 MG tablet Take 81 mg by mouth daily.    Marland Kitchen atorvastatin (LIPITOR) 10 MG tablet Take 10 mg by mouth daily.    Marland Kitchen diltiazem (CARDIZEM CD) 240 MG 24 hr capsule Take 240 mg by mouth daily.    . fenofibrate (TRICOR) 145 MG tablet Take 145  mg by mouth daily.    Marland Kitchen glipiZIDE (GLUCOTROL) 5 MG tablet Take 5 mg by mouth 2 (two) times daily before a meal.    . losartan-hydrochlorothiazide (HYZAAR) 100-25 MG per tablet Take 1 tablet by mouth daily.    . metFORMIN (GLUCOPHAGE) 500 MG tablet Take 500 mg by mouth 2 (two) times daily with a meal.    . Multiple Vitamin (MULTIVITAMIN WITH MINERALS) TABS tablet Take 1 tablet by mouth daily.    Marland Kitchen omeprazole (PRILOSEC) 20 MG capsule Take 20 mg by mouth daily.    Marland Kitchen PARoxetine (PAXIL) 40 MG tablet Take 40 mg by mouth every morning.    . pramipexole (MIRAPEX) 1 MG tablet Take 1 mg by mouth at bedtime.    . solifenacin (VESICARE) 10 MG tablet Take 10 mg by mouth daily.    . TURMERIC PO Take 1 tablet by mouth daily. Takes 300mg  daily    . valACYclovir (VALTREX) 1000 MG tablet Take 1,000 mg by mouth as needed  (out break).      No current facility-administered medications for this visit.    Family History  Problem Relation Age of Onset  . Heart disease Mother   . Hypertension Mother   . Stroke Mother   . Breast cancer Mother   . Hypertension Brother   . Diabetes Mellitus II Father   . Cancer Father     CLL  . Breast cancer Paternal Grandmother     ROS:  Pertinent items are noted in HPI.  Otherwise, a comprehensive ROS was negative.  Exam:   BP 130/70 mmHg  Pulse 80  Resp 14  Ht 5\' 4"  (1.626 m)  Wt 243 lb 12.8 oz (110.587 kg)  BMI 41.83 kg/m2      Height: 5\' 4"  (162.6 cm)  Ht Readings from Last 3 Encounters:  10/21/14 5\' 4"  (1.626 m)  04/22/14 5\' 4"  (1.626 m)  10/15/13 5' 3.5" (1.613 m)    General appearance: alert, cooperative and appears stated age Head: Normocephalic, without obvious abnormality, atraumatic Neck: no adenopathy, supple, symmetrical, trachea midline and thyroid normal to inspection and palpation Lungs: clear to auscultation bilaterally Breasts: normal appearance, no masses or tenderness, Inspection negative, No nipple retraction or dimpling, No nipple discharge or bleeding, No axillary or supraclavicular adenopathy Heart: regular rate and rhythm Abdomen: soft, non-tender; bowel sounds normal; no masses,  no organomegaly Extremities: extremities normal, atraumatic, no cyanosis or edema Skin: Skin color, texture, turgor normal. No rashes or lesions Lymph nodes: Cervical, supraclavicular, and axillary nodes normal. No abnormal inguinal nodes palpated Neurologic: Grossly normal   Pelvic: External genitalia:  no lesions              Urethra:  normal appearing urethra with no masses, tenderness or lesions              Bartholins and Skenes: normal                 Vagina: normal appearing vagina with normal color and discharge, no lesions              Cervix: absent              Pap taken: No. Bimanual Exam:  Uterus:  uterus absent              Adnexa: no  mass, fullness, tenderness               Rectovaginal: Confirms  Anus:  normal sphincter tone, no lesions  Chaperone was present for exam.  A:  Well Woman with normal exam Family history of breast cancer.  Mixed incontinence.  Obesity.  History of HSV, probably type I.   P:   Mammogram yearly.  pap smear not indicated.  Referral to Ileana Roup at [facility] for pelvic floor therapy.  Continue with Vesicare.  Prescribed through PCP.  Encouraged weight loss.  Valtrex prn.  Does not need refill.  return annually or prn

## 2014-10-21 NOTE — Patient Instructions (Signed)

## 2014-11-18 ENCOUNTER — Other Ambulatory Visit (INDEPENDENT_AMBULATORY_CARE_PROVIDER_SITE_OTHER): Payer: 59

## 2014-11-18 ENCOUNTER — Encounter: Payer: Self-pay | Admitting: Family

## 2014-11-18 ENCOUNTER — Ambulatory Visit (INDEPENDENT_AMBULATORY_CARE_PROVIDER_SITE_OTHER): Payer: 59 | Admitting: Family

## 2014-11-18 VITALS — BP 128/68 | HR 73 | Temp 98.1°F | Resp 18 | Ht 63.5 in | Wt 243.0 lb

## 2014-11-18 DIAGNOSIS — F329 Major depressive disorder, single episode, unspecified: Secondary | ICD-10-CM

## 2014-11-18 DIAGNOSIS — F419 Anxiety disorder, unspecified: Secondary | ICD-10-CM

## 2014-11-18 DIAGNOSIS — E119 Type 2 diabetes mellitus without complications: Secondary | ICD-10-CM

## 2014-11-18 DIAGNOSIS — E785 Hyperlipidemia, unspecified: Secondary | ICD-10-CM | POA: Diagnosis not present

## 2014-11-18 DIAGNOSIS — I10 Essential (primary) hypertension: Secondary | ICD-10-CM

## 2014-11-18 DIAGNOSIS — F418 Other specified anxiety disorders: Secondary | ICD-10-CM | POA: Diagnosis not present

## 2014-11-18 DIAGNOSIS — F32A Depression, unspecified: Secondary | ICD-10-CM

## 2014-11-18 LAB — HEMOGLOBIN A1C: Hgb A1c MFr Bld: 7.3 % — ABNORMAL HIGH (ref 4.6–6.5)

## 2014-11-18 MED ORDER — METFORMIN HCL 500 MG PO TABS: 500.0000 mg | ORAL_TABLET | Freq: Two times a day (BID) | ORAL | Status: DC

## 2014-11-18 MED ORDER — ATORVASTATIN CALCIUM 10 MG PO TABS: 10.0000 mg | ORAL_TABLET | Freq: Every day | ORAL | Status: DC

## 2014-11-18 MED ORDER — FENOFIBRATE 145 MG PO TABS: 145.0000 mg | ORAL_TABLET | Freq: Every day | ORAL | Status: DC

## 2014-11-18 MED ORDER — PRAMIPEXOLE DIHYDROCHLORIDE 1 MG PO TABS: 1.0000 mg | ORAL_TABLET | Freq: Every day | ORAL | Status: DC

## 2014-11-18 MED ORDER — PAROXETINE HCL 40 MG PO TABS: 40.0000 mg | ORAL_TABLET | ORAL | Status: DC

## 2014-11-18 MED ORDER — LOSARTAN POTASSIUM-HCTZ 100-25 MG PO TABS: 1.0000 | ORAL_TABLET | Freq: Every day | ORAL | Status: DC

## 2014-11-18 MED ORDER — SOLIFENACIN SUCCINATE 10 MG PO TABS: 10.0000 mg | ORAL_TABLET | Freq: Every day | ORAL | Status: DC

## 2014-11-18 MED ORDER — OMEPRAZOLE 20 MG PO CPDR: 20.0000 mg | DELAYED_RELEASE_CAPSULE | Freq: Every day | ORAL | Status: DC

## 2014-11-18 MED ORDER — DILTIAZEM HCL ER COATED BEADS 240 MG PO CP24: 240.0000 mg | ORAL_CAPSULE | Freq: Every day | ORAL | Status: DC

## 2014-11-18 NOTE — Assessment & Plan Note (Signed)
Stable with current regimen. Continue diltiazem and losartan-hydrochlorothiazide at current dosages. Kidney function was evaluated in January and found to be normal.

## 2014-11-18 NOTE — Progress Notes (Signed)
Pre visit review using our clinic review tool, if applicable. No additional management support is needed unless otherwise documented below in the visit note. 

## 2014-11-18 NOTE — Assessment & Plan Note (Signed)
Still with current regimen. Lipid profile from outside lab work reveals total cholesterol of 142, triglycerides of 119, HDL of 41, and LDL of 86. Continue current dosages of atorvastatin and fenofibrate at this time.

## 2014-11-18 NOTE — Assessment & Plan Note (Signed)
Previous A1c 7.1. Patient indicates no complications noted at this time. Obtain A1c. Continue current dosages of glipizide and metformin. Kidney function was previously tested in January and noted to be normal. Diabetic eye exam is up-to-date.

## 2014-11-18 NOTE — Progress Notes (Signed)
Subjective:    Patient ID: Diane Gregory, female    DOB: October 16, 1955, 59 y.o.   MRN: [id number]  Chief Complaint  Patient presents with  . Establish Care    Needs refills on some of her medication    HPI:  Diane Gregory is a 59 y.o. female who presents today to establish care and discuss her diabetes and lab work.   1) Diabetes - Previously diagnosed with type 2 diabetes. Current treatment regimen includes metformin and glipizide. Patient indicates she takes metformin regularly, however does not take the glipizide on a regular basis. Her last eye exam was completed approximately 1 week ago. Her last A1c done in January reviewed off previous outside lab work showed 7.1. She does take her blood glucose at home, however sporadically  Lab Results  Component Value Date   HGBA1C 6.9* 08/11/2013    2) Hypercholesterolemia - stable with current regimen of fenofibrate and atorvastatin. Patient denies any muscle pains.   3) Depression / Anxiety - stable with current regimen of Paxil. Denies any adverse effects. Indicates the medication helps control her anxiety and depression. Denies any suicidal ideations.  4) Hypertension - previously diagnosed with hypertension. Current treatment regimen includes losartan-hydrochlorothiazide, and diltiazem. Denies any adverse effects of medication. Takes her blood pressure periodically at home but has not recorded a couple months.  BP Readings from Last 3 Encounters:  11/18/14 128/68  10/21/14 130/70  05/06/14 136/50    Allergies  Allergen Reactions  . Celebrex [Celecoxib] Swelling  . Penicillins Other (See Comments)    Drop in blood pressure    Current Outpatient Prescriptions on File Prior to Visit  Medication Sig Dispense Refill  . acyclovir cream (ZOVIRAX) 5 % Apply 1 application topically every 3 (three) hours as needed (out break).    Marland Kitchen aspirin 81 MG tablet Take 81 mg by mouth daily.    Marland Kitchen glipiZIDE (GLUCOTROL) 5 MG tablet Take 5 mg by mouth  2 (two) times daily before a meal.    . Multiple Vitamin (MULTIVITAMIN WITH MINERALS) TABS tablet Take 1 tablet by mouth daily.    . TURMERIC PO Take 1 tablet by mouth daily. Takes 300mg  daily    . valACYclovir (VALTREX) 1000 MG tablet Take 1,000 mg by mouth as needed (out break).      No current facility-administered medications on file prior to visit.    Past Medical History  Diagnosis Date  . Hypertension   . Restless leg syndrome   . Urinary incontinence   . Anxiety   . GERD (gastroesophageal reflux disease)   . High triglycerides   . Hyperlipidemia   . Depression   . Sleep apnea     mild; does not wear CPAP  . Urgency of urination     and frequency takes Vesicare  . Chronic bronchitis     "used to get it q yr; not as much since septal repair in 2004"  . Walking pneumonia ~ 2013  . Type II diabetes mellitus   . Left knee DJD   . Primary localized osteoarthritis of right knee   . Arthritis     "was in my knees before OR; now in my hands" (05/05/2014)  . HSV infection     Past Surgical History  Procedure Laterality Date  . Uvulopalatopharyngoplasty, tonsillectomy and septoplasty  2004  . Knee arthroscopy w/ meniscectomy Right 2010  . Tonsillectomy  ~ 2004  . Refractive surgery Bilateral ~ 2005  . Colonoscopy    .  Total knee arthroplasty Left 08/11/2013    Procedure: LEFT TOTAL KNEE ARTHROPLASTY;  Surgeon: Lorn Junes, MD;  Location: [place];  Service: Orthopedics;  Laterality: Left;  . Hysteroscopy  11/94    submuous myomas  . Total knee arthroplasty Right 05/04/2014    Procedure: RIGHT TOTAL KNEE ARTHROPLASTY;  Surgeon: Lorn Junes, MD;  Location: [place];  Service: Orthopedics;  Laterality: Right;  . Knee arthroscopy Right ~ 2010  . Vaginal hysterectomy  06/1993    Family History  Problem Relation Age of Onset  . Heart disease Mother   . Hypertension Mother   . Stroke Mother   . Breast cancer Mother   . Hypertension Brother   . Diabetes Mellitus II  Father   . Cancer Father     CLL  . Breast cancer Paternal Grandmother   . Breast cancer Maternal Grandmother     History   Social History  . Marital Status: Single    Spouse Name: N/A  . Number of Children: 2  . Years of Education: 16   Occupational History  . Intake Coordinator    Social History Main Topics  . Smoking status: Never Smoker   . Smokeless tobacco: Never Used  . Alcohol Use: 0.0 oz/week    0 Standard drinks or equivalent per week     Comment: 05/05/2014 "might have a drink once/month"  . Drug Use: No  . Sexual Activity:    Partners: Male    Birth Control/ Protection: Surgical     Comment: [facility] 1994   Other Topics Concern  . Not on file   Social History Narrative   Currently single. Fun: Crafts, flea markets, antiquing.    Denies religious beliefs that effect health care.     Review of Systems  Eyes:       Denies changes in vision  Respiratory: Negative for chest tightness and shortness of breath.   Cardiovascular: Negative for chest pain, palpitations and leg swelling.  Endocrine: Negative for polydipsia, polyphagia and polyuria.  Neurological: Negative for numbness and headaches.  Psychiatric/Behavioral: Negative for suicidal ideas and sleep disturbance. The patient is not nervous/anxious.       Objective:    BP 128/68 mmHg  Pulse 73  Temp(Src) 98.1 F (36.7 C) (Oral)  Resp 18  Ht 5' 3.5" (1.613 m)  Wt 243 lb (110.224 kg)  BMI 42.37 kg/m2  SpO2 95% Nursing note and vital signs reviewed.  Physical Exam  Constitutional: She is oriented to person, place, and time. She appears well-developed and well-nourished. No distress.  Obese female seated in wheelchair, dressed appropriately for the situation, and appears her stated age.  Cardiovascular: Normal rate, regular rhythm, normal heart sounds and intact distal pulses.   Pulmonary/Chest: Effort normal and breath sounds normal.  Neurological: She is alert and oriented to person, place, and  time.  Skin: Skin is warm and dry.  Psychiatric: She has a normal mood and affect. Her behavior is normal. Judgment and thought content normal.       Assessment & Plan:

## 2014-11-18 NOTE — Assessment & Plan Note (Signed)
Stable with current regimen. Patient denies any feelings of anxiety or depression. Denies suicidal ideation. Continue current dosage of Paxil.

## 2014-11-18 NOTE — Patient Instructions (Signed)
Thank you for choosing [facility].  Summary/Instructions:  Please schedule a time for your physical at your convenience.  Please take your medications as prescribed.   Your prescription(s) have been submitted to your pharmacy or been printed and provided for you. Please take as directed and contact our office if you believe you are having problem(s) with the medication(s) or have any questions.  Please stop by the lab on the basement level of the building for your blood work. Your results will be released to [facility] (or called to you) after review, usually within 72 hours after test completion. If any changes need to be made, you will be notified at that same time.  If your symptoms worsen or fail to improve, please contact our office for further instruction, or in case of emergency go directly to the emergency room at the closest medical facility.

## 2014-11-23 ENCOUNTER — Telehealth: Payer: Self-pay | Admitting: Family

## 2014-11-23 NOTE — Telephone Encounter (Signed)
Rec'd from [facility] @ [place] forward 42 pages to Dr. Elna Breslow

## 2014-11-23 NOTE — Telephone Encounter (Signed)
Rec'd From [place] forward 48 pages to [facility]

## 2014-12-07 ENCOUNTER — Ambulatory Visit (INDEPENDENT_AMBULATORY_CARE_PROVIDER_SITE_OTHER): Payer: 59 | Admitting: Family

## 2014-12-07 ENCOUNTER — Encounter: Payer: Self-pay | Admitting: Family

## 2014-12-07 VITALS — BP 120/64 | HR 77 | Temp 98.1°F | Resp 18 | Ht 63.5 in | Wt 243.8 lb

## 2014-12-07 DIAGNOSIS — R05 Cough: Secondary | ICD-10-CM | POA: Diagnosis not present

## 2014-12-07 DIAGNOSIS — R059 Cough, unspecified: Secondary | ICD-10-CM | POA: Insufficient documentation

## 2014-12-07 MED ORDER — LEVOFLOXACIN 500 MG PO TABS: 500.0000 mg | ORAL_TABLET | Freq: Every day | ORAL | Status: DC

## 2014-12-07 MED ORDER — BENZONATATE 100 MG PO CAPS: 100.0000 mg | ORAL_CAPSULE | Freq: Three times a day (TID) | ORAL | Status: DC | PRN

## 2014-12-07 NOTE — Progress Notes (Signed)
Subjective:    Patient ID: Diane Gregory, female    DOB: [birth year], 59 y.o.   MRN: [id number]  Chief Complaint  Patient presents with  . Cough    x4 days, a little productive cough, runny nose, congestion, and chest tightness    HPI:  Diane Gregory is a 59 y.o. female with a PMH of hypertension, GERD, Type 2 diabetes, and anxiety/depression who presents today for an acute office visit.   This is a new problem. Associated symptoms of productive cough, runny nose, congestion and chest tightness has been gong on for about 4 days, but has had a cold for a couple of weeks. Indicates that it "feels like bronchitis." Modifying factors include Mucinex which has not helped greatly. Timing of symptoms is constant throughout the day. Severity of the cough is enough to keep her up at night and also effecting her work.    Allergies  Allergen Reactions  . Celebrex [Celecoxib] Swelling  . Penicillins Other (See Comments)    Drop in blood pressure    Current Outpatient Prescriptions on File Prior to Visit  Medication Sig Dispense Refill  . acyclovir cream (ZOVIRAX) 5 % Apply 1 application topically every 3 (three) hours as needed (out break).    Marland Kitchen aspirin 81 MG tablet Take 81 mg by mouth daily.    Marland Kitchen atorvastatin (LIPITOR) 10 MG tablet Take 1 tablet (10 mg total) by mouth daily. 30 tablet 2  . diltiazem (CARDIZEM CD) 240 MG 24 hr capsule Take 1 capsule (240 mg total) by mouth daily. 30 capsule 2  . fenofibrate (TRICOR) 145 MG tablet Take 1 tablet (145 mg total) by mouth daily. 30 tablet 2  . glipiZIDE (GLUCOTROL) 5 MG tablet Take 5 mg by mouth 2 (two) times daily before a meal.    . losartan-hydrochlorothiazide (HYZAAR) 100-25 MG per tablet Take 1 tablet by mouth daily. 30 tablet 2  . metFORMIN (GLUCOPHAGE) 500 MG tablet Take 1 tablet (500 mg total) by mouth 2 (two) times daily with a meal. 60 tablet 2  . Multiple Vitamin (MULTIVITAMIN WITH MINERALS) TABS tablet Take 1 tablet by mouth daily.    Marland Kitchen  omeprazole (PRILOSEC) 20 MG capsule Take 1 capsule (20 mg total) by mouth daily. 30 capsule 2  . PARoxetine (PAXIL) 40 MG tablet Take 1 tablet (40 mg total) by mouth every morning. 30 tablet 2  . pramipexole (MIRAPEX) 1 MG tablet Take 1 tablet (1 mg total) by mouth at bedtime. 30 tablet 2  . solifenacin (VESICARE) 10 MG tablet Take 1 tablet (10 mg total) by mouth daily. 30 tablet 2  . TURMERIC PO Take 1 tablet by mouth daily. Takes 300mg  daily    . valACYclovir (VALTREX) 1000 MG tablet Take 1,000 mg by mouth as needed (out break).      No current facility-administered medications on file prior to visit.     Review of Systems  Constitutional: Negative for fever and chills.  HENT: Positive for congestion, sinus pressure and sore throat. Negative for ear discharge and ear pain.   Respiratory: Positive for cough and chest tightness. Negative for shortness of breath.   Neurological: Positive for headaches.      Objective:    BP 120/64 mmHg  Pulse 77  Temp(Src) 98.1 F (36.7 C) (Oral)  Resp 18  Ht 5' 3.5" (1.613 m)  Wt 243 lb 12.8 oz (110.587 kg)  BMI 42.50 kg/m2  SpO2 97% Nursing note and vital signs reviewed.  Physical  Exam  Constitutional: She is oriented to person, place, and time. She appears well-developed and well-nourished. No distress.  HENT:  Right Ear: Hearing, tympanic membrane, external ear and ear canal normal.  Left Ear: Hearing, tympanic membrane, external ear and ear canal normal.  Nose: Right sinus exhibits maxillary sinus tenderness and frontal sinus tenderness. Left sinus exhibits maxillary sinus tenderness and frontal sinus tenderness.  Mouth/Throat: Uvula is midline, oropharynx is clear and moist and mucous membranes are normal.  Cardiovascular: Normal rate, regular rhythm, normal heart sounds and intact distal pulses.   Pulmonary/Chest: Effort normal and breath sounds normal.  Neurological: She is alert and oriented to person, place, and time.  Skin: Skin is  warm and dry.  Psychiatric: She has a normal mood and affect. Her behavior is normal. Judgment and thought content normal.       Assessment & Plan:

## 2014-12-07 NOTE — Assessment & Plan Note (Signed)
Symptoms and exam consistent with sinusitis, however cannot rule out underlying bronchitis. Start Levaquin. Start Tessalon as needed for sleep and cough. Continue over-the-counter medications as needed for symptom relief and supportive care. Follow-up if symptoms worsen or fail to improve.

## 2014-12-07 NOTE — Patient Instructions (Signed)
Thank you for choosing [facility].  Summary/Instructions:  Your prescription(s) have been submitted to your pharmacy or been printed and provided for you. Please take as directed and contact our office if you believe you are having problem(s) with the medication(s) or have any questions.  If your symptoms worsen or fail to improve, please contact our office for further instruction, or in case of emergency go directly to the emergency room at the closest medical facility.   General Recommendations:    Please drink plenty of fluids.  Get plenty of rest   Sleep in humidified air  Use saline nasal sprays  Netti pot   OTC Medications:  Decongestants - helps relieve congestion   Flonase (generic fluticasone) or Nasacort (generic triamcinolone) - please make sure to use the "cross-over" technique at a 45 degree angle towards the opposite eye as opposed to straight up the nasal passageway.   Sudafed (generic pseudoephedrine - Note this is the one that is available behind the pharmacy counter); Products with phenylephrine (-PE) may also be used but is often not as effective as pseudoephedrine.   If you have HIGH BLOOD PRESSURE - Coricidin HBP; AVOID any product that is -D as this contains pseudoephedrine which may increase your blood pressure.  Afrin (oxymetazoline) every 6-8 hours for up to 3 days.   Allergies - helps relieve runny nose, itchy eyes and sneezing   Claritin (generic loratidine), Allegra (fexofenidine), or Zyrtec (generic cyrterizine) for runny nose. These medications should not cause drowsiness.  Note - Benadryl (generic diphenhydramine) may be used however may cause drowsiness  Cough -   Delsym or Robitussin (generic dextromethorphan)  Expectorants - helps loosen mucus to ease removal   Mucinex (generic guaifenesin) as directed on the package.  Headaches / General Aches   Tylenol (generic acetaminophen) - DO NOT EXCEED 3 grams (3,000 mg) in a 24  hour time period  Advil/Motrin (generic ibuprofen)   Sore Throat -   Salt water gargle   Chloraseptic (generic benzocaine) spray or lozenges / Sucrets (generic dyclonine)      

## 2014-12-08 ENCOUNTER — Telehealth: Payer: Self-pay | Admitting: Family

## 2014-12-08 MED ORDER — AZITHROMYCIN 500 MG PO TABS: 500.0000 mg | ORAL_TABLET | Freq: Every day | ORAL | Status: DC

## 2014-12-08 NOTE — Telephone Encounter (Signed)
A prescription for Azithromycin has been sent to her pharmacy.

## 2014-12-08 NOTE — Telephone Encounter (Signed)
Patient was seen yesterday and was given levofloxacin (LEVAQUIN) 500 MG tablet [[id number]. This antibiotic kind of scares her and she was wondering if there is a different kind that can be prescribed to her. Pharmacy is [facility] on [address]

## 2014-12-08 NOTE — Telephone Encounter (Signed)
Pt aware.

## 2015-01-03 ENCOUNTER — Emergency Department (HOSPITAL_BASED_OUTPATIENT_CLINIC_OR_DEPARTMENT_OTHER): Payer: 59

## 2015-01-03 ENCOUNTER — Encounter (HOSPITAL_BASED_OUTPATIENT_CLINIC_OR_DEPARTMENT_OTHER): Payer: Self-pay | Admitting: Family Medicine

## 2015-01-03 ENCOUNTER — Emergency Department (HOSPITAL_BASED_OUTPATIENT_CLINIC_OR_DEPARTMENT_OTHER)
Admission: EM | Admit: 2015-01-03 | Discharge: 2015-01-03 | Disposition: A | Discharge status: Home / Self Care | Payer: 59 | Attending: Emergency Medicine | Admitting: Emergency Medicine

## 2015-01-03 DIAGNOSIS — K219 Gastro-esophageal reflux disease without esophagitis: Secondary | ICD-10-CM | POA: Diagnosis not present

## 2015-01-03 DIAGNOSIS — E119 Type 2 diabetes mellitus without complications: Secondary | ICD-10-CM | POA: Insufficient documentation

## 2015-01-03 DIAGNOSIS — Z880 Allergy status to penicillin: Secondary | ICD-10-CM | POA: Diagnosis not present

## 2015-01-03 DIAGNOSIS — E785 Hyperlipidemia, unspecified: Secondary | ICD-10-CM | POA: Diagnosis not present

## 2015-01-03 DIAGNOSIS — Z8619 Personal history of other infectious and parasitic diseases: Secondary | ICD-10-CM | POA: Insufficient documentation

## 2015-01-03 DIAGNOSIS — Z8701 Personal history of pneumonia (recurrent): Secondary | ICD-10-CM | POA: Diagnosis not present

## 2015-01-03 DIAGNOSIS — M1990 Unspecified osteoarthritis, unspecified site: Secondary | ICD-10-CM | POA: Diagnosis not present

## 2015-01-03 DIAGNOSIS — Z79899 Other long term (current) drug therapy: Secondary | ICD-10-CM | POA: Diagnosis not present

## 2015-01-03 DIAGNOSIS — R05 Cough: Secondary | ICD-10-CM | POA: Diagnosis not present

## 2015-01-03 DIAGNOSIS — Z7982 Long term (current) use of aspirin: Secondary | ICD-10-CM | POA: Diagnosis not present

## 2015-01-03 DIAGNOSIS — F419 Anxiety disorder, unspecified: Secondary | ICD-10-CM | POA: Insufficient documentation

## 2015-01-03 DIAGNOSIS — I10 Essential (primary) hypertension: Secondary | ICD-10-CM | POA: Diagnosis not present

## 2015-01-03 DIAGNOSIS — Z792 Long term (current) use of antibiotics: Secondary | ICD-10-CM | POA: Insufficient documentation

## 2015-01-03 DIAGNOSIS — Z8669 Personal history of other diseases of the nervous system and sense organs: Secondary | ICD-10-CM | POA: Insufficient documentation

## 2015-01-03 DIAGNOSIS — R053 Chronic cough: Secondary | ICD-10-CM

## 2015-01-03 DIAGNOSIS — Z8709 Personal history of other diseases of the respiratory system: Secondary | ICD-10-CM | POA: Diagnosis not present

## 2015-01-03 DIAGNOSIS — F329 Major depressive disorder, single episode, unspecified: Secondary | ICD-10-CM | POA: Diagnosis not present

## 2015-01-03 HISTORY — DX: Bronchitis, not specified as acute or chronic: J40

## 2015-01-03 NOTE — Discharge Instructions (Signed)
Please follow with your primary care doctor in the next 2 days for a check-up. They must obtain records for further management.  ° °Do not hesitate to return to the Emergency Department for any new, worsening or concerning symptoms.  ° ° °Cough, Adult ° A cough is a reflex that helps clear your throat and airways. It can help heal the body or may be a reaction to an irritated airway. A cough may only last 2 or 3 weeks (acute) or may last more than 8 weeks (chronic).  °CAUSES °Acute cough: °· Viral or bacterial infections. °Chronic cough: °· Infections. °· Allergies. °· Asthma. °· Post-nasal drip. °· Smoking. °· Heartburn or acid reflux. °· Some medicines. °· Chronic lung problems (COPD). °· Cancer. °SYMPTOMS  °· Cough. °· Fever. °· Chest pain. °· Increased breathing rate. °· High-pitched whistling sound when breathing (wheezing). °· Colored mucus that you cough up (sputum). °TREATMENT  °· A bacterial cough may be treated with antibiotic medicine. °· A viral cough must run its course and will not respond to antibiotics. °· Your caregiver may recommend other treatments if you have a chronic cough. °HOME CARE INSTRUCTIONS  °· Only take over-the-counter or prescription medicines for pain, discomfort, or fever as directed by your caregiver. Use cough suppressants only as directed by your caregiver. °· Use a cold steam vaporizer or humidifier in your bedroom or home to help loosen secretions. °· Sleep in a semi-upright position if your cough is worse at night. °· Rest as needed. °· Stop smoking if you smoke. °SEEK IMMEDIATE MEDICAL CARE IF:  °· You have pus in your sputum. °· Your cough starts to worsen. °· You cannot control your cough with suppressants and are losing sleep. °· You begin coughing up blood. °· You have difficulty breathing. °· You develop pain which is getting worse or is uncontrolled with medicine. °· You have a fever. °MAKE SURE YOU:  °· Understand these instructions. °· Will watch your  condition. °· Will get help right away if you are not doing well or get worse. °Document Released: 01/27/2011 Document Revised: 10/23/2011 Document Reviewed: 01/27/2011 °ExitCare® Patient Information ©2015 [employer]. This information is not intended to replace advice given to you by your health care provider. Make sure you discuss any questions you have with your health care provider. ° °

## 2015-01-03 NOTE — ED Notes (Signed)
Pt c/o cough x 6 wks that is worse. Pt saw her PCP and took antibiotics 2 wks ago for same. Denies fever, chills, n/v/d.

## 2015-01-03 NOTE — ED Provider Notes (Signed)
CSN: [id number]     Arrival date & time 01/03/15  1103 History   First MD Initiated Contact with Patient 01/03/15 1202     Chief Complaint  Patient presents with  . Cough     (Consider location/radiation/quality/duration/timing/severity/associated sxs/prior Treatment) HPI   Diane Gregory is a 59 y.o. female for hypertension, GERD, hyperlipidemia  , chronic bronchitis, type 2 diabetes non-insulin-dependent diabetes complaining of dry cough onset 2 weeks ago. Patient reports a pleuritic anterior chest pain and hoarse voice starting 2 days ago. She denies fever, chills, shortness of breath, increasing peripheral edema, orthopnea, PND, diaphoresis, nausea, vomiting , rhinorrhea, nasal congestion, otalgia,  h/o DVT/PE, calf pain or leg swelling, hemoptysis, recent immobilization (Knee replacement September 2015), cancer/chemotherapy in the last 6 months, exogenous estrogen. Patient states that she has been taking OTC allergy medications with little relief. Patient states that she stopped taking her Prilosec at approximately the same time as the onset cough started due to concerns over possible carcnogenic side effects. Patient was given a 5 day course of erythromycin by her PCP 2 weeks ago this did not improve her symptoms.  She describes her chest pain is diffuse anterior, sharp, rated at 4 out of 10 and pleuritic in nature.  Past Medical History  Diagnosis Date  . Hypertension   . Restless leg syndrome   . Urinary incontinence   . Anxiety   . GERD (gastroesophageal reflux disease)   . High triglycerides   . Hyperlipidemia   . Depression   . Sleep apnea     mild; does not wear CPAP  . Urgency of urination     and frequency takes Vesicare  . Chronic bronchitis     "used to get it q yr; not as much since septal repair in 2004"  . Walking pneumonia ~ 2013  . Type II diabetes mellitus   . Left knee DJD   . Primary localized osteoarthritis of right knee   . Arthritis     "was in my knees  before OR; now in my hands" (05/05/2014)  . HSV infection   . Bronchitis    Past Surgical History  Procedure Laterality Date  . Uvulopalatopharyngoplasty, tonsillectomy and septoplasty  2004  . Knee arthroscopy w/ meniscectomy Right 2010  . Tonsillectomy  ~ 2004  . Refractive surgery Bilateral ~ 2005  . Colonoscopy    . Total knee arthroplasty Left 08/11/2013    Procedure: LEFT TOTAL KNEE ARTHROPLASTY;  Surgeon: Lorn Junes, MD;  Location: [facility];  Service: Orthopedics;  Laterality: Left;  . Hysteroscopy  11/94    submuous myomas  . Total knee arthroplasty Right 05/04/2014    Procedure: RIGHT TOTAL KNEE ARTHROPLASTY;  Surgeon: Lorn Junes, MD;  Location: [facility];  Service: Orthopedics;  Laterality: Right;  . Knee arthroscopy Right ~ 2010  . Vaginal hysterectomy  06/1993   Family History  Problem Relation Age of Onset  . Heart disease Mother   . Hypertension Mother   . Stroke Mother   . Breast cancer Mother   . Hypertension Brother   . Diabetes Mellitus II Father   . Cancer Father     CLL  . Breast cancer Paternal Grandmother   . Breast cancer Maternal Grandmother    History  Substance Use Topics  . Smoking status: Never Smoker   . Smokeless tobacco: Never Used  . Alcohol Use: 0.0 oz/week    0 Standard drinks or equivalent per week     Comment:  05/05/2014 "might have a drink once/month"   OB History    Gravida Para Term Preterm AB TAB SAB Ectopic Multiple Living   2 2 2       2      Review of Systems  10 systems reviewed and found to be negative, except as noted in the HPI.  Allergies  Celebrex and Penicillins  Home Medications   Prior to Admission medications   Medication Sig Start Date End Date Taking? Authorizing Provider  acyclovir cream (ZOVIRAX) 5 % Apply 1 application topically every 3 (three) hours as needed (out break).    Historical Provider, MD  aspirin 81 MG tablet Take 81 mg by mouth daily.    Historical Provider, MD  atorvastatin (LIPITOR) 10  MG tablet Take 1 tablet (10 mg total) by mouth daily. 11/18/14   Golden Circle, FNP  azithromycin (ZITHROMAX) 500 MG tablet Take 1 tablet (500 mg total) by mouth daily. 12/08/14   Golden Circle, FNP  benzonatate (TESSALON PERLES) 100 MG capsule Take 1 capsule (100 mg total) by mouth 3 (three) times daily as needed for cough. 12/07/14   Golden Circle, FNP  diltiazem (CARDIZEM CD) 240 MG 24 hr capsule Take 1 capsule (240 mg total) by mouth daily. 11/18/14   Golden Circle, FNP  fenofibrate (TRICOR) 145 MG tablet Take 1 tablet (145 mg total) by mouth daily. 11/18/14   Golden Circle, FNP  glipiZIDE (GLUCOTROL) 5 MG tablet Take 5 mg by mouth 2 (two) times daily before a meal.    Historical Provider, MD  losartan-hydrochlorothiazide (HYZAAR) 100-25 MG per tablet Take 1 tablet by mouth daily. 11/18/14   Golden Circle, FNP  metFORMIN (GLUCOPHAGE) 500 MG tablet Take 1 tablet (500 mg total) by mouth 2 (two) times daily with a meal. 11/18/14   Golden Circle, FNP  Multiple Vitamin (MULTIVITAMIN WITH MINERALS) TABS tablet Take 1 tablet by mouth daily.    Historical Provider, MD  omeprazole (PRILOSEC) 20 MG capsule Take 1 capsule (20 mg total) by mouth daily. 11/18/14   Golden Circle, FNP  PARoxetine (PAXIL) 40 MG tablet Take 1 tablet (40 mg total) by mouth every morning. 11/18/14   Golden Circle, FNP  pramipexole (MIRAPEX) 1 MG tablet Take 1 tablet (1 mg total) by mouth at bedtime. 11/18/14   Golden Circle, FNP  solifenacin (VESICARE) 10 MG tablet Take 1 tablet (10 mg total) by mouth daily. 11/18/14   Golden Circle, FNP  TURMERIC PO Take 1 tablet by mouth daily. Takes 300mg  daily    Historical Provider, MD  valACYclovir (VALTREX) 1000 MG tablet Take 1,000 mg by mouth as needed (out break).     Historical Provider, MD   BP 133/64 mmHg  Pulse 84  Temp(Src) 98.2 F (36.8 C) (Oral)  Resp 20  SpO2 95% Physical Exam  Constitutional: She is oriented to person, place, and time. She appears  well-developed and well-nourished. No distress.   Persistent cough  HENT:  Head: Normocephalic.  Mouth/Throat: Oropharynx is clear and moist.  Eyes: Conjunctivae are normal.  Neck: Normal range of motion. No JVD present. No tracheal deviation present.  Cardiovascular: Normal rate, regular rhythm and intact distal pulses.   Radial pulse equal bilaterally  Pulmonary/Chest: Effort normal and breath sounds normal. No stridor. No respiratory distress. She has no wheezes. She has no rales. She exhibits no tenderness.  Abdominal: Soft. She exhibits no distension and no mass. There is no tenderness. There is  no rebound and no guarding.  Musculoskeletal: Normal range of motion. She exhibits no edema or tenderness.  No calf asymmetry, superficial collaterals, palpable cords, edema, Homans sign negative bilaterally.    Neurological: She is alert and oriented to person, place, and time.  Skin: Skin is warm. She is not diaphoretic.  Psychiatric: She has a normal mood and affect.  Nursing note and vitals reviewed.   ED Course  Procedures (including critical care time) Labs Review Labs Reviewed - No data to display  Imaging Review Dg Chest 2 View  01/03/2015   CLINICAL DATA:  Cough for 6 weeks, worsening 2 days ago.  EXAM: CHEST  2 VIEW  COMPARISON:  08/04/2013  FINDINGS: The heart size and mediastinal contours are within normal limits. Both lungs are clear. The visualized skeletal structures are unremarkable.  IMPRESSION: No active cardiopulmonary disease.   Electronically Signed   By: Rolm Baptise M.D.   On: 01/03/2015 11:45     EKG Interpretation None      MDM   Final diagnoses:  Persistent cough for 3 weeks or longer    Filed Vitals:   01/03/15 1113  BP: 133/64  Pulse: 84  Temp: 98.2 F (36.8 C)  TempSrc: Oral  Resp: 20  SpO2: 95%    Diane Gregory is a pleasant 59 y.o. female presenting with Persistent dry cough for 6 weeks, lung sounds are clear to auscultation, patient is  saturating well on room air, there is no tachypnea or tachycardia. No signs of systemic infection. Chest x-ray without infiltrate. She has no signs of CHF. Patient takes losartan, she also DC'd her Prilosec just before the onset of cough. Advised her that she may want to try to restart her antacid and discuss with her physician possibility that the high blood pressure medication may be causing the cough. Patient is low risk for DVT by Wells criteria, I doubt PE, no exogenous estrogen, recent immobilizations, calf pain or leg swelling. I have offered to give this patient either Vicodin or Tessalon Perles for cough and she has declined. We've had an extensive discussion of return precautions and patient verbalizes her understanding.  Discussed case with attending MD who agrees with plan and stability to d/c to home.   Evaluation does not show pathology that would require ongoing emergent intervention or inpatient treatment. Pt is hemodynamically stable and mentating appropriately. Discussed findings and plan with patient/guardian, who agrees with care plan. All questions answered. Return precautions discussed and outpatient follow up given.   New Prescriptions   No medications on file         Monico Blitz, PA-C 01/03/15 [place], MD 01/09/15 [phone]

## 2015-01-06 ENCOUNTER — Encounter: Payer: Self-pay | Admitting: Internal Medicine

## 2015-01-06 ENCOUNTER — Ambulatory Visit (INDEPENDENT_AMBULATORY_CARE_PROVIDER_SITE_OTHER): Payer: 59 | Admitting: Internal Medicine

## 2015-01-06 VITALS — BP 122/60 | HR 72 | Temp 97.6°F | Ht 63.5 in | Wt 246.2 lb

## 2015-01-06 DIAGNOSIS — R1310 Dysphagia, unspecified: Secondary | ICD-10-CM

## 2015-01-06 DIAGNOSIS — R05 Cough: Secondary | ICD-10-CM

## 2015-01-06 DIAGNOSIS — K2100 Gastro-esophageal reflux disease with esophagitis, without bleeding: Secondary | ICD-10-CM

## 2015-01-06 DIAGNOSIS — R059 Cough, unspecified: Secondary | ICD-10-CM

## 2015-01-06 MED ORDER — OMEPRAZOLE 20 MG PO CPDR: DELAYED_RELEASE_CAPSULE | ORAL | Status: DC

## 2015-01-06 NOTE — Progress Notes (Signed)
Pre visit review using our clinic review tool, if applicable. No additional management support is needed unless otherwise documented below in the visit note. 

## 2015-01-06 NOTE — Progress Notes (Signed)
   Subjective:    Patient ID: Diane Gregory, female    DOB: 1956/02/28, 59 y.o.   MRN: [id number]  HPI She describes a cough which is nonproductive the last 4-5 weeks. It progressed over the weekend prompting a emergency room visit 01/03/15. Those records were reviewed. Chest x-ray was interpreted as normal.Upon personal review there is suggestion of increased interstitial markings. Narcotic cough syrup was prescribed but she declined this. She's been using Delsym, Aleve D, and oxycodone left over from previous knee surgery. The cough is nonproductive and has been awakening her at night. She did stop Prilosec approximately a month ago due to concerns over risk related to long-term use. She has a history of bronchitis 10 years ago. She has a history of GERD but has never had an upper endoscopy. She also has had sleep apnea for which she had a uvulectomy.  She describes some dysphagia which had begun after the uvulectomy. She continues to have dysphagia essentially once a day after breakfast. She denies other GI symptoms.  She has had some nasal congestion and obstruction. She also has some fatigue.  She drinks 2 cups of coffee a day and one cola before 3 PM. She will drink decaf iced tea after 3 PM. She rarely drinks alcohol. She does not ingest peppermint or chocolate. She does use Hall's cough drops.  She's never smoked.   Review of Systems Frontal headache, facial pain , nasal purulence, dental pain, sore throat , otic pain or otic discharge denied. No fever , chills or sweats.  Unexplained weight loss, abdominal pain, significant dyspepsia,  melena, rectal bleeding, or persistently small caliber stools are denied.    Objective:   Physical Exam Pertinent or positive findings include: She does have some erythema of the nasal mucosa. There is also minimal erythema of the oropharynx. Oropharynx is crowded. Uvulectomy is present.  General appearance :adequately nourished; in no distress. Eyes:  No conjunctival inflammation or scleral icterus is present. Oral exam:  Lips and gums are healthy appearing.There is no oropharyngeal  exudate noted. Dental hygiene is good. Heart:  Normal rate and regular rhythm. S1 and S2 normal without gallop, murmur, click, rub or other extra sounds   Lungs:Chest clear to auscultation; no wheezes, rhonchi,rales ,or rubs present.No increased work of breathing.  Abdomen: bowel sounds normal, soft and non-tender without masses, organomegaly or hernias noted.  No guarding or rebound.  Vascular : all pulses equal ; no bruits present. Skin:Warm & dry.  Intact without suspicious lesions or rashes ; no tenting or jaundice  Lymphatic: No lymphadenopathy is noted about the head, neck, axilla Neuro: Strength, tone & DTRs normal.          Assessment & Plan:  #1 cough most likely related to chronic reflux with esophagitis  #2 daily dysphagia  #3 sleep apnea  Plan: See orders and recommendations

## 2015-01-06 NOTE — Patient Instructions (Signed)
Reflux of gastric acid may be asymptomatic as this may occur mainly during sleep.The triggers for reflux  include stress; the "aspirin family" ; alcohol; peppermint; and caffeine (coffee, tea, cola, and chocolate). The aspirin family would include aspirin and the nonsteroidal agents such as ibuprofen &  Naproxen. Tylenol would not cause reflux. If having symptoms ; food & drink should be avoided for @ least 2 hours before going to bed. Take the protein pump inhibitor Omeprazole  30 minutes before breakfast and 30 minutes before the evening meal for 8 weeks then go back to once a day  30 minutes before breakfast.The GI referral will be scheduled and you'll be notified of the time.Please call the Referral Co-Ordinator @ [phone] if you have not been notified of appointment time within 7-10 days.

## 2015-01-07 ENCOUNTER — Encounter: Payer: Self-pay | Admitting: Gastroenterology

## 2015-02-04 ENCOUNTER — Other Ambulatory Visit: Payer: Self-pay | Admitting: Family

## 2015-02-09 ENCOUNTER — Encounter: Payer: Self-pay | Admitting: Physician Assistant

## 2015-02-15 ENCOUNTER — Other Ambulatory Visit: Payer: Self-pay | Admitting: Family

## 2015-02-16 ENCOUNTER — Ambulatory Visit: Payer: 59 | Admitting: Gastroenterology

## 2015-02-23 ENCOUNTER — Encounter: Payer: Self-pay | Admitting: Physician Assistant

## 2015-02-23 ENCOUNTER — Ambulatory Visit (INDEPENDENT_AMBULATORY_CARE_PROVIDER_SITE_OTHER): Payer: 59 | Admitting: Physician Assistant

## 2015-02-23 VITALS — BP 106/70 | HR 60 | Ht 63.75 in | Wt 245.0 lb

## 2015-02-23 DIAGNOSIS — Z1211 Encounter for screening for malignant neoplasm of colon: Secondary | ICD-10-CM | POA: Diagnosis not present

## 2015-02-23 DIAGNOSIS — T17308A Unspecified foreign body in larynx causing other injury, initial encounter: Secondary | ICD-10-CM | POA: Diagnosis not present

## 2015-02-23 DIAGNOSIS — K219 Gastro-esophageal reflux disease without esophagitis: Secondary | ICD-10-CM

## 2015-02-23 MED ORDER — NA SULFATE-K SULFATE-MG SULF 17.5-3.13-1.6 GM/177ML PO SOLN: 1.0000 | Freq: Once | ORAL | Status: AC

## 2015-02-23 NOTE — Progress Notes (Signed)
Agree with initial assessment and plans as outlined 

## 2015-02-23 NOTE — Patient Instructions (Addendum)
Stop aspirin 3 days prior to 05-12-2015 for the procedures.    You have been scheduled for an endoscopy and colonoscopy. Please follow the written instructions given to you at your visit today. Please pick up your prep supplies at the pharmacy within the next 1-3 days. [facility] [address] and [place] If you use inhalers (even only as needed), please bring them with you on the day of your procedure. Your physician has requested that you go to www.startemmi.com and enter the access code given to you at your visit today. This web site gives a general overview about your procedure. However, you should still follow specific instructions given to you by our office regarding your preparation for the procedure.         Normal BMI (Body Mass Index- based on height and weight) is between 19 and 25. Your BMI today is Body mass index is 42.4 kg/(m^2). Marland Kitchen Please consider follow up  regarding your BMI with your Primary Care Provider.

## 2015-02-23 NOTE — Progress Notes (Signed)
Patient ID: Diane Gregory, female   DOB: [birth year], 59 y.o.   MRN: [id number]   Subjective:    Patient ID: Diane Gregory, female    DOB: 19-Sep-1955, 59 y.o.   MRN: [id number]  HPI Diane Gregory is a pleasant 59 year old white female known remotely to Dr. Henrene Pastor from screening colonoscopy done in 2006. This was a negative exam. She comes back today to discuss follow-up colonoscopy and also with complaints of chronic and frequent episodes of choking. Patient has history of adult-onset diabetes mellitus, obesity, hyperlipidemia, and degenerative joint disease. She states she underwent a uvulectomy and tonsillectomy and adenoidectomy about 12 years ago for sleep apnea and has had some problems with choking ever since then. She denies any problems with liquids visit says at least once per day she will have an episode of choking with solid food. She says frequently this will just be a very small piece of food that she feels "goes down the wrong way and gets in her lungs. This causes immediate coughing and gasping and she will cough up a small piece of food. She really does not feel that food is sticking in her esophagus and has no complaints of real dysphagia oral diet and aphasia. She does have chronic reflux and has been on medication for years. She says on Prilosec she does well recently this was increased to twice daily because of increasing coughing and she feels that that has helped. She has had issues with chronic cough for several years as well. Patient has no family history of colon cancer or polyps. She has some intermittent alteration in her bowel habits with episodes of diarrhea and constipation but nothing on a regular basis. She denies any abdominal pain melena or hematochezia. She says she tried to get off her Prilosec a while back and went on an herbal substitutes and had terrible problems with immediate increase in indigestion and cough. Again with twice a day dosing of Prilosec her symptoms settle down  over the past month.  Review of Systems Pertinent positive and negative review of systems were noted in the above HPI section.  All other review of systems was otherwise negative.  Outpatient Encounter Prescriptions as of 02/23/2015  Medication Sig  . acyclovir cream (ZOVIRAX) 5 % Apply 1 application topically every 3 (three) hours as needed (out break).  Marland Kitchen aspirin 81 MG tablet Take 81 mg by mouth daily.  Marland Kitchen atorvastatin (LIPITOR) 10 MG tablet TAKE 1 TABLET BY MOUTH DAILY  . CARTIA XT 240 MG 24 hr capsule TAKE 1 CAPSULE BY MOUTH DAILY  . fenofibrate (TRICOR) 145 MG tablet Take 1 tablet (145 mg total) by mouth daily.  Marland Kitchen glipiZIDE (GLUCOTROL) 5 MG tablet Take 5 mg by mouth 2 (two) times daily before a meal.  . losartan-hydrochlorothiazide (HYZAAR) 100-25 MG per tablet Take 1 tablet by mouth daily.  . metFORMIN (GLUCOPHAGE) 500 MG tablet Take 1 tablet (500 mg total) by mouth 2 (two) times daily with a meal.  . Multiple Vitamin (MULTIVITAMIN WITH MINERALS) TABS tablet Take 1 tablet by mouth daily.  Marland Kitchen omeprazole (PRILOSEC) 20 MG capsule Take this 30 minutes before breakfast and 30 minutes before the evening meal for 8 weeks  . PARoxetine (PAXIL) 40 MG tablet Take 1 tablet (40 mg total) by mouth every morning.  . pramipexole (MIRAPEX) 1 MG tablet TAKE 1 TABLET BY MOUTH EVERY NIGHT AT BEDTIME  . TURMERIC PO Take 1 tablet by mouth daily. Takes 34m daily  . valACYclovir (VALTREX)  1000 MG tablet Take 1,000 mg by mouth as needed (out break).   . VESICARE 10 MG tablet TAKE 1 TABLET BY MOUTH DAILY  . Na Sulfate-K Sulfate-Mg Sulf SOLN Take 1 kit by mouth once.  . [DISCONTINUED] azithromycin (ZITHROMAX) 500 MG tablet Take 1 tablet (500 mg total) by mouth daily.  . [DISCONTINUED] benzonatate (TESSALON PERLES) 100 MG capsule Take 1 capsule (100 mg total) by mouth 3 (three) times daily as needed for cough.   No facility-administered encounter medications on file as of 02/23/2015.   Allergies  Allergen  Reactions  . Celebrex [Celecoxib] Swelling  . Penicillins Other (See Comments)    Drop in blood pressure   Patient Active Problem List   Diagnosis Date Noted  . Cough 12/07/2014  . Anxiety and depression 11/18/2014  . S/P left total knee replacement   . Right knee DJD   . Primary localized osteoarthritis of right knee   . DJD (degenerative joint disease) of knee 08/11/2013  . Diabetes mellitus type 2, controlled, without complications   . Hypertension   . Restless leg syndrome   . GERD (gastroesophageal reflux disease)   . High triglycerides   . Hyperlipidemia   . Sleep apnea   . Left knee DJD    History   Social History  . Marital Status: Single    Spouse Name: N/A  . Number of Children: 2  . Years of Education: 16   Occupational History  . Intake Coordinator    Social History Main Topics  . Smoking status: Never Smoker   . Smokeless tobacco: Never Used  . Alcohol Use: 0.0 oz/week    0 Standard drinks or equivalent per week     Comment: 05/05/2014 "might have a drink once/month"  . Drug Use: No  . Sexual Activity:    Partners: Male    Birth Control/ Protection: Surgical     Comment: [facility] 1994   Other Topics Concern  . Not on file   Social History Narrative   Currently single. Fun: Crafts, flea markets, antiquing.    Denies religious beliefs that effect health care.     Ms. Weichel's family history includes Breast cancer in her maternal grandmother, mother, and paternal grandmother; Cancer in her father; Diabetes Mellitus II in her father; Heart disease in her mother; Hypertension in her brother and mother; Stroke in her mother.      Objective:    Filed Vitals:   02/23/15 0954  BP: 106/70  Pulse: 60    Physical Exam  white female in no acute distress, pleasant blood pressure 106/70 pulse 60 height 5 foot 3 weight 245, BMI 42.3. HEENT; nontraumatic normocephalic EOMI PERRLA sclera anicteric, Supple; no JVD, Cardiovascular; regular rate and rhythm with S1-S2  no murmur or gallop, Pulmonary; clear bilaterally, Abdomen ;obese soft nontender nondistended bowel sounds are active there is no palpable mass or hepatosplenomegaly, Rectal; exam not done, Extremities ;no clubbing cyanosis or edema skin warm and dry, Psych; mood and affect appropriate       Assessment & Plan:   #1 59 yo female with chronic GERD, and chronic cough felt reflux related #2 frequent choking episodes which by hx sound like aspiration and  not dysphagia- r/o stricture #3 colon neoplasia screening- average risk, negative colonoscopy 2006  Plan; Continue Prilosec 40 mg po Qam  Schedule for EGD/possible dilation if indicated and colonoscopy with Dr.Perry. Procedures discussed in detail with pt and she is agreeable to proceed.   Raeqwon Lux S Sirron Francesconi PA-C  02/23/2015   Cc: Hendricks Limes, MD

## 2015-03-01 ENCOUNTER — Other Ambulatory Visit: Payer: Self-pay | Admitting: Family

## 2015-03-07 ENCOUNTER — Other Ambulatory Visit: Payer: Self-pay | Admitting: Family

## 2015-03-10 ENCOUNTER — Other Ambulatory Visit: Payer: Self-pay

## 2015-03-10 MED ORDER — FENOFIBRATE 160 MG PO TABS: 160.0000 mg | ORAL_TABLET | Freq: Every day | ORAL | Status: DC

## 2015-03-19 ENCOUNTER — Other Ambulatory Visit: Payer: Self-pay | Admitting: Family

## 2015-03-24 ENCOUNTER — Other Ambulatory Visit: Payer: Self-pay | Admitting: Family

## 2015-03-30 ENCOUNTER — Telehealth: Payer: Self-pay

## 2015-03-30 MED ORDER — PRAMIPEXOLE DIHYDROCHLORIDE 1 MG PO TABS: 1.0000 mg | ORAL_TABLET | Freq: Every day | ORAL | Status: DC

## 2015-03-30 NOTE — Telephone Encounter (Signed)
Medication refilled

## 2015-03-30 NOTE — Telephone Encounter (Signed)
Pt requesting refill of pramipexole 1 mg

## 2015-04-19 ENCOUNTER — Other Ambulatory Visit: Payer: Self-pay | Admitting: Family

## 2015-04-20 ENCOUNTER — Other Ambulatory Visit: Payer: Self-pay | Admitting: Family

## 2015-05-12 ENCOUNTER — Encounter: Payer: 59 | Admitting: Internal Medicine

## 2015-05-12 ENCOUNTER — Encounter: Payer: Self-pay | Admitting: Internal Medicine

## 2015-05-12 ENCOUNTER — Ambulatory Visit (AMBULATORY_SURGERY_CENTER): Payer: 59 | Admitting: Internal Medicine

## 2015-05-12 VITALS — BP 133/70 | HR 75 | Temp 98.6°F | Resp 25 | Ht 63.0 in | Wt 245.0 lb

## 2015-05-12 DIAGNOSIS — Z1211 Encounter for screening for malignant neoplasm of colon: Secondary | ICD-10-CM | POA: Diagnosis present

## 2015-05-12 DIAGNOSIS — K635 Polyp of colon: Secondary | ICD-10-CM

## 2015-05-12 DIAGNOSIS — K219 Gastro-esophageal reflux disease without esophagitis: Secondary | ICD-10-CM | POA: Diagnosis not present

## 2015-05-12 DIAGNOSIS — D125 Benign neoplasm of sigmoid colon: Secondary | ICD-10-CM

## 2015-05-12 LAB — GLUCOSE, CAPILLARY
GLUCOSE-CAPILLARY: 116 mg/dL — AB (ref 65–99)
Glucose-Capillary: 134 mg/dL — ABNORMAL HIGH (ref 65–99)

## 2015-05-12 MED ORDER — SODIUM CHLORIDE 0.9 % IV SOLN: 500.0000 mL | INTRAVENOUS | Status: DC

## 2015-05-12 NOTE — Op Note (Signed)
[facility]  [address] [place], [address]   ENDOSCOPY PROCEDURE REPORT  PATIENT: Diane Gregory, Diane Gregory  MR#: [id number] BIRTHDATE: [birth year] , 59  yrs. old GENDER: female ENDOSCOPIST: Eustace Quail, MD REFERRED BY:  .  Self / Office PROCEDURE DATE:  05/12/2015 PROCEDURE:  EGD, diagnostic ASA CLASS:     Class III INDICATIONS:  history of esophageal reflux.  "choking sensation" with meals MEDICATIONS: Monitored anesthesia care and Propofol 150 mg IV TOPICAL ANESTHETIC: none  DESCRIPTION OF PROCEDURE: After the risks benefits and alternatives of the procedure were thoroughly explained, informed consent was obtained.  The LB [id number] endoscope was introduced through the mouth and advanced to the second portion of the duodenum , Without limitations.  The instrument was slowly withdrawn as the mucosa was fully examined.  Esophagus revealed a large caliber ring at the gastroesophageal junction.  Otherwise normal without active inflammation or obstruction.  The stomach was normal.  The duodenum was normal. Retroflexed views revealed a hiatal hernia.     The scope was then withdrawn from the patient and the procedure completed.  COMPLICATIONS: There were no immediate complications.  ENDOSCOPIC IMPRESSION: 1. Large caliber distal esophageal ring (incidental) 2. Moderate hiatal hernia 3. GERD  RECOMMENDATIONS: 1.  Anti-reflux regimen to be followed. Particular attention to weight loss 2.  Continue PPI 3.  My office will arrange for you to have a Modified Barium Swallow with Speech pathology "choking with meals, rule out oropharyngeal dysphagia".  REPEAT EXAM:  eSigned:  Eustace Quail, MD 05/12/2015 2:26 PM    CC:The Patient  ; Mauricio Po, FNP

## 2015-05-12 NOTE — Op Note (Signed)
[facility]  [address] [place], [address]   COLONOSCOPY PROCEDURE REPORT  PATIENT: Diane Gregory, Diane Gregory  MR#: [id number] BIRTHDATE: Sep 22, 1955 , 59  yrs. old GENDER: female ENDOSCOPIST: Eustace Quail, MD REFERRED BY:.  Self / Office PROCEDURE DATE:  05/12/2015 PROCEDURE:   Colonoscopy, screening and Colonoscopy with snare polypectomy X 1 First Screening Colonoscopy - Avg.  risk and is 50 yrs.  old or older - No.  Prior Negative Screening - Now for repeat screening. 10 or more years since last screening  History of Adenoma - Now for follow-up colonoscopy & has been > or = to 3 yrs.  N/A  Polyps removed today? Yes ASA CLASS:   Class III INDICATIONS:Screening for colonic neoplasia and Colorectal Neoplasm Risk Assessment for this procedure is average risk.  . Negative index exam temperature thousand 6 MEDICATIONS: Monitored anesthesia care and Propofol 350 mg IV  DESCRIPTION OF PROCEDURE:   After the risks benefits and alternatives of the procedure were thoroughly explained, informed consent was obtained.  The digital rectal exam revealed no abnormalities of the rectum.   The LB [id number]  endoscope was introduced through the anus and advanced to the cecum, which was identified by both the appendix and ileocecal valve. No adverse events experienced.   The quality of the prep was excellent. (Suprep was used)  The instrument was then slowly withdrawn as the colon was fully examined. Estimated blood loss is zero unless otherwise noted in this procedure report.  COLON FINDINGS: A pedunculated polyp measuring 5 mm in size was found in the sigmoid colon.  A polypectomy was performed with a cold snare.  The resection was complete, the polyp tissue was completely retrieved and sent to histology.   The examination was otherwise normal.  Retroflexed views revealed no abnormalities. The time to cecum = 6.4 Withdrawal time = 10.0   The scope was withdrawn and the  procedure completed. COMPLICATIONS: There were no immediate complications.  ENDOSCOPIC IMPRESSION: 1.   Pedunculated polyp was found in the sigmoid colon; polypectomy was performed with a cold snare 2.   The examination was otherwise normal  RECOMMENDATIONS: 1.  Repeat colonoscopy in 5 years if polyp adenomatous; otherwise 10 years 2.  Upper endoscopy today (please see report)  eSigned:  Eustace Quail, MD 05/12/2015 2:22 PM   cc: The Patient    :Mauricio Po, FNP

## 2015-05-12 NOTE — Progress Notes (Signed)
Report to PACU, RN, vss, BBS= Clear.  

## 2015-05-12 NOTE — Progress Notes (Signed)
Called to room to assist during endoscopic procedure.  Patient ID and intended procedure confirmed with present staff. Received instructions for my participation in the procedure from the performing physician.  

## 2015-05-12 NOTE — Patient Instructions (Addendum)

## 2015-05-13 ENCOUNTER — Telehealth: Payer: Self-pay | Admitting: *Deleted

## 2015-05-13 ENCOUNTER — Other Ambulatory Visit: Payer: Self-pay

## 2015-05-13 DIAGNOSIS — R1314 Dysphagia, pharyngoesophageal phase: Secondary | ICD-10-CM

## 2015-05-13 NOTE — Telephone Encounter (Signed)
  Follow up Call-  Call back number 05/12/2015  Post procedure Call Back phone  # [phone]  Permission to leave phone message Yes     Patient questions:  Do you have a fever, pain , or abdominal swelling? No. Pain Score  0 *  Have you tolerated food without any problems? Yes.    Have you been able to return to your normal activities? Yes.    Do you have any questions about your discharge instructions: Diet   No. Medications  No. Follow up visit  No.  Do you have questions or concerns about your Care? No.  Actions: * If pain score is 4 or above: No action needed, pain <4.

## 2015-05-17 ENCOUNTER — Encounter: Payer: Self-pay | Admitting: Internal Medicine

## 2015-05-19 ENCOUNTER — Telehealth: Payer: Self-pay

## 2015-05-19 ENCOUNTER — Other Ambulatory Visit (HOSPITAL_COMMUNITY): Payer: Self-pay | Admitting: Internal Medicine

## 2015-05-19 DIAGNOSIS — R1310 Dysphagia, unspecified: Secondary | ICD-10-CM

## 2015-05-19 NOTE — Telephone Encounter (Signed)
Pt scheduled for modified barium swallow at [facility] radiology 05/28/15@1pm . Pt to arrive there at 12:45pm. No prep needed. Left message for pt to call back.

## 2015-05-24 NOTE — Telephone Encounter (Signed)
Noted  

## 2015-05-24 NOTE — Telephone Encounter (Signed)
Spoke with pt and let her know about appt but pt states she has a lot going on right now and she has cancelled the appt. Dr. Henrene Pastor aware.

## 2015-05-28 ENCOUNTER — Ambulatory Visit (HOSPITAL_COMMUNITY): Payer: 59

## 2015-05-28 ENCOUNTER — Other Ambulatory Visit: Payer: Self-pay | Admitting: Internal Medicine

## 2015-06-07 ENCOUNTER — Other Ambulatory Visit: Payer: Self-pay

## 2015-06-07 MED ORDER — SOLIFENACIN SUCCINATE 10 MG PO TABS: 10.0000 mg | ORAL_TABLET | Freq: Every day | ORAL | Status: DC

## 2015-07-05 ENCOUNTER — Encounter: Payer: Self-pay | Admitting: Family

## 2015-07-05 ENCOUNTER — Other Ambulatory Visit (INDEPENDENT_AMBULATORY_CARE_PROVIDER_SITE_OTHER): Payer: 59

## 2015-07-05 ENCOUNTER — Ambulatory Visit (INDEPENDENT_AMBULATORY_CARE_PROVIDER_SITE_OTHER): Payer: 59 | Admitting: Family

## 2015-07-05 VITALS — BP 120/64 | HR 92 | Temp 98.2°F | Resp 18 | Ht 63.75 in | Wt 245.1 lb

## 2015-07-05 DIAGNOSIS — K2100 Gastro-esophageal reflux disease with esophagitis, without bleeding: Secondary | ICD-10-CM

## 2015-07-05 DIAGNOSIS — IMO0001 Reserved for inherently not codable concepts without codable children: Secondary | ICD-10-CM

## 2015-07-05 DIAGNOSIS — Z23 Encounter for immunization: Secondary | ICD-10-CM

## 2015-07-05 DIAGNOSIS — E1165 Type 2 diabetes mellitus with hyperglycemia: Secondary | ICD-10-CM | POA: Diagnosis not present

## 2015-07-05 LAB — BASIC METABOLIC PANEL
BUN: 18 mg/dL (ref 6–23)
CHLORIDE: 97 meq/L (ref 96–112)
CO2: 27 meq/L (ref 19–32)
CREATININE: 0.62 mg/dL (ref 0.40–1.20)
Calcium: 9.6 mg/dL (ref 8.4–10.5)
GFR: 104.19 mL/min (ref >60.00)
Glucose, Bld: 161 mg/dL — ABNORMAL HIGH (ref 70–99)
Potassium: 4.1 mEq/L (ref 3.5–5.1)
Sodium: 136 mEq/L (ref 135–145)

## 2015-07-05 LAB — HEMOGLOBIN A1C: HEMOGLOBIN A1C: 9.1 % — AB (ref 4.6–6.5)

## 2015-07-05 MED ORDER — GLUCOSE BLOOD VI STRP: ORAL_STRIP | Status: DC

## 2015-07-05 MED ORDER — ONETOUCH ULTRASOFT LANCETS MISC: Status: DC

## 2015-07-05 MED ORDER — OMEPRAZOLE 10 MG PO CPDR: DELAYED_RELEASE_CAPSULE | ORAL | Status: DC

## 2015-07-05 MED ORDER — GLIPIZIDE ER 5 MG PO TB24: 5.0000 mg | ORAL_TABLET | Freq: Every day | ORAL | Status: DC

## 2015-07-05 NOTE — Progress Notes (Signed)
Pre visit review using our clinic review tool, if applicable. No additional management support is needed unless otherwise documented below in the visit note. 

## 2015-07-05 NOTE — Patient Instructions (Signed)
Thank you for choosing [facility].  Summary/Instructions:  Your prescription(s) have been submitted to your pharmacy or been printed and provided for you. Please take as directed and contact our office if you believe you are having problem(s) with the medication(s) or have any questions.  If your symptoms worsen or fail to improve, please contact our office for further instruction, or in case of emergency go directly to the emergency room at the closest medical facility.   Diabetes and Exercise Exercising regularly is important. It is not just about losing weight. It has many health benefits, such as:  Improving your overall fitness, flexibility, and endurance.  Increasing your bone density.  Helping with weight control.  Decreasing your body fat.  Increasing your muscle strength.  Reducing stress and tension.  Improving your overall health. People with diabetes who exercise gain additional benefits because exercise:  Reduces appetite.  Improves the body's use of blood sugar (glucose).  Helps lower or control blood glucose.  Decreases blood pressure.  Helps control blood lipids (such as cholesterol and triglycerides).  Improves the body's use of the hormone insulin by:  Increasing the body's insulin sensitivity.  Reducing the body's insulin needs.  Decreases the risk for heart disease because exercising:  Lowers cholesterol and triglycerides levels.  Increases the levels of good cholesterol (such as high-density lipoproteins [HDL]) in the body.  Lowers blood glucose levels. YOUR ACTIVITY PLAN  Choose an activity that you enjoy, and set realistic goals. To exercise safely, you should begin practicing any new physical activity slowly, and gradually increase the intensity of the exercise over time. Your health care provider or diabetes educator can help create an activity plan that works for you. General recommendations include:  Encouraging children to engage  in at least 60 minutes of physical activity each day.  Stretching and performing strength training exercises, such as yoga or weight lifting, at least 2 times per week.  Performing a total of at least 150 minutes of moderate-intensity exercise each week, such as brisk walking or water aerobics.  Exercising at least 3 days per week, making sure you allow no more than 2 consecutive days to pass without exercising.  Avoiding long periods of inactivity (90 minutes or more). When you have to spend an extended period of time sitting down, take frequent breaks to walk or stretch. RECOMMENDATIONS FOR EXERCISING WITH TYPE 1 OR TYPE 2 DIABETES   Check your blood glucose before exercising. If blood glucose levels are greater than 240 mg/dL, check for urine ketones. Do not exercise if ketones are present.  Avoid injecting insulin into areas of the body that are going to be exercised. For example, avoid injecting insulin into:  The arms when playing tennis.  The legs when jogging.  Keep a record of:  Food intake before and after you exercise.  Expected peak times of insulin action.  Blood glucose levels before and after you exercise.  The type and amount of exercise you have done.  Review your records with your health care provider. Your health care provider will help you to develop guidelines for adjusting food intake and insulin amounts before and after exercising.  If you take insulin or oral hypoglycemic agents, watch for signs and symptoms of hypoglycemia. They include:  Dizziness.  Shaking.  Sweating.  Chills.  Confusion.  Drink plenty of water while you exercise to prevent dehydration or heat stroke. Body water is lost during exercise and must be replaced.  Talk to your health care provider  before starting an exercise program to make sure it is safe for you. Remember, almost any type of activity is better than none.   This information is not intended to replace advice given  to you by your health care provider. Make sure you discuss any questions you have with your health care provider.   Document Released: 10/21/2003 Document Revised: 12/15/2014 Document Reviewed: 01/07/2013 Elsevier Interactive Patient Education [employer].

## 2015-07-05 NOTE — Progress Notes (Signed)
Subjective:    Patient ID: Diane Gregory, female    DOB: 1956-08-08, 59 y.o.   MRN: [id number]  Chief Complaint  Patient presents with  . Diabetes    Fasting, would like A1c, BS was 172 this Gregory, had a high BS one night last week of 325    HPI:  Diane Gregory is a 59 y.o. female who  has a past medical history of Hypertension; Restless leg syndrome; Urinary incontinence; Anxiety; GERD (gastroesophageal reflux disease); High triglycerides; Hyperlipidemia; Depression; Sleep apnea; Urgency of urination; Chronic bronchitis ([place]); Walking pneumonia (~ 2013); Type II diabetes mellitus ([place]); Left knee DJD; Primary localized osteoarthritis of right knee; Arthritis; HSV infection; Bronchitis; and Obesity. and presents today for a follow up office visit.  1.) Diabetes - currently maintained on metformin and glipizide. Takes the metformin as prescribed and is only taking the glipizide once daily on average. Also notes that her diet is not very well maintained either. Denies adverse side effects or hypoglycemic events. Notes that her average Gregory blood sugars range around 200. She has not been testing on a regular basis. She is maintained on atorvastatin and losartan for coronary artery disease risk prevention. Diabetic eye exam is up-to-date. Due for foot exam. Pneumovax is up-to-date. Denies any symptoms of end organ damage.   Lab Results  Component Value Date   HGBA1C 7.3* 11/18/2014    Wt Readings from Last 3 Encounters:  07/05/15 245 lb 1.9 oz (111.186 kg)  05/12/15 245 lb (111.131 kg)  02/23/15 245 lb (111.131 kg)    Allergies  Allergen Reactions  . Celebrex [Celecoxib] Swelling  . Penicillins Other (See Comments)    Drop in blood pressure     Current Outpatient Prescriptions on File Prior to Visit  Medication Sig Dispense Refill  . acyclovir cream (ZOVIRAX) 5 % Apply 1 application topically every 3 (three) hours as needed (out break).    Marland Kitchen aspirin 81 MG tablet Take 81 mg by  mouth daily.    Marland Kitchen atorvastatin (LIPITOR) 10 MG tablet TAKE 1 TABLET BY MOUTH DAILY 30 tablet 0  . CARTIA XT 240 MG 24 hr capsule TAKE ONE CAPSULE BY MOUTH DAILY 30 capsule 0  . clindamycin (CLEOCIN) 300 MG capsule TAKE 2 CAPLETS BY MOUTH 1 HOUR PRIOR TO DENTAL WORK  0  . fenofibrate 160 MG tablet Take 1 tablet (160 mg total) by mouth daily. 30 tablet 0  . losartan-hydrochlorothiazide (HYZAAR) 100-25 MG per tablet Take 1 tablet by mouth daily. 30 tablet 2  . metFORMIN (GLUCOPHAGE) 500 MG tablet Take 1 tablet (500 mg total) by mouth 2 (two) times daily with a meal. 60 tablet 2  . Multiple Vitamin (MULTIVITAMIN WITH MINERALS) TABS tablet Take 1 tablet by mouth daily.    Marland Kitchen PARoxetine (PAXIL) 40 MG tablet TAKE 1 TABLET BY MOUTH EVERY Gregory 30 tablet 0  . pramipexole (MIRAPEX) 1 MG tablet Take 1 tablet (1 mg total) by mouth at bedtime. 30 tablet 3  . solifenacin (VESICARE) 10 MG tablet Take 1 tablet (10 mg total) by mouth daily. 90 tablet 1  . TURMERIC PO Take 1 tablet by mouth daily. Takes 300mg  daily    . valACYclovir (VALTREX) 1000 MG tablet Take 1,000 mg by mouth as needed (out break).      No current facility-administered medications on file prior to visit.    Review of Systems  Constitutional: Negative for fever and chills.       Morbidly obese female dressed  appropriately and appears her stated age.   Eyes:       Denies changes in vision  Respiratory: Negative for chest tightness.   Cardiovascular: Negative for chest pain, palpitations and leg swelling.  Endocrine: Negative for polydipsia, polyphagia and polyuria.  Neurological: Negative for numbness.      Objective:    BP 120/64 mmHg  Pulse 92  Temp(Src) 98.2 F (36.8 C) (Oral)  Resp 18  Ht 5' 3.75" (1.619 m)  Wt 245 lb 1.9 oz (111.186 kg)  BMI 42.42 kg/m2  SpO2 98% Nursing note and vital signs reviewed.  Physical Exam  Constitutional: She is oriented to person, place, and time. She appears well-developed and  well-nourished. No distress.  Cardiovascular: Normal rate, regular rhythm, normal heart sounds and intact distal pulses.   Pulmonary/Chest: Effort normal and breath sounds normal.  Neurological: She is alert and oriented to person, place, and time.  Diabetic foot exam - bilateral feet are free from skin breakdown, cuts, and abrasions. Toenails are neatly trimmed. Pulses are intact and appropriate. Sensation is intact to monofilament bilaterally.  Skin: Skin is warm and dry.  Psychiatric: She has a normal mood and affect. Her behavior is normal. Judgment and thought content normal.       Assessment & Plan:   Problem List Items Addressed This Visit      Digestive   GERD (gastroesophageal reflux disease)   Relevant Medications   omeprazole (PRILOSEC) 10 MG capsule     Endocrine   Diabetes type 2, uncontrolled ([facility]) - Primary    Type 2 diabetes remains uncontrolled with current regimen, however patient has less than fully compliant with medication regimen taking the medication periodically. She also reports her nutritional intake is less than optimal. Diabetic foot exam completed today. Obtain A1c and basic metabolic panel. Encouraged physical activity to help reduce blood sugars and weight. Continue current dosage of metformin. Discontinue glipizide. Start glipizide XL. Follow-up pending A1c results.      Relevant Medications   glipiZIDE (GLUCOTROL XL) 5 MG 24 hr tablet   Lancets (ONETOUCH ULTRASOFT) lancets   glucose blood (ONE TOUCH ULTRA TEST) test strip   Other Relevant Orders   Hemoglobin [id number]   Basic Metabolic Panel (BMET)     Other   Severe obesity (BMI >= 40) (HCC)    Severe obesity with BMI of 42.42. Encouraged increasing physical activity to 30 minutes most days of the week. Emphasize importance of a moderate and varied nutritional intake that emphasizes nutrient dense foods and is low in saturated and transfer fats. Goal weight loss of approximately 5-10% in the next 6  months. Obesity is contributing to her diabetes and hypertension.      Relevant Medications   glipiZIDE (GLUCOTROL XL) 5 MG 24 hr tablet

## 2015-07-05 NOTE — Assessment & Plan Note (Signed)
Type 2 diabetes remains uncontrolled with current regimen, however patient has less than fully compliant with medication regimen taking the medication periodically. She also reports her nutritional intake is less than optimal. Diabetic foot exam completed today. Obtain A1c and basic metabolic panel. Encouraged physical activity to help reduce blood sugars and weight. Continue current dosage of metformin. Discontinue glipizide. Start glipizide XL. Follow-up pending A1c results.

## 2015-07-05 NOTE — Assessment & Plan Note (Addendum)
Severe obesity with BMI of 42.42. Encouraged increasing physical activity to 30 minutes most days of the week. Emphasize importance of a moderate and varied nutritional intake that emphasizes nutrient dense foods and is low in saturated and transfer fats. Goal weight loss of approximately 5-10% in the next 6 months. Obesity is contributing to her diabetes and hypertension.

## 2015-07-19 ENCOUNTER — Other Ambulatory Visit: Payer: Self-pay | Admitting: Family

## 2015-08-12 ENCOUNTER — Other Ambulatory Visit: Payer: Self-pay | Admitting: Family

## 2015-08-23 ENCOUNTER — Other Ambulatory Visit: Payer: Self-pay | Admitting: Family

## 2015-09-07 ENCOUNTER — Other Ambulatory Visit: Payer: Self-pay | Admitting: Family

## 2015-09-18 ENCOUNTER — Other Ambulatory Visit: Payer: Self-pay | Admitting: Family

## 2015-10-21 ENCOUNTER — Other Ambulatory Visit: Payer: Self-pay | Admitting: Family

## 2015-10-22 ENCOUNTER — Other Ambulatory Visit: Payer: Self-pay | Admitting: Family

## 2015-10-23 ENCOUNTER — Other Ambulatory Visit: Payer: Self-pay | Admitting: Family

## 2015-11-03 LAB — HM MAMMOGRAPHY: HM MAMMO: NEGATIVE

## 2015-11-04 ENCOUNTER — Other Ambulatory Visit (INDEPENDENT_AMBULATORY_CARE_PROVIDER_SITE_OTHER): Payer: 59

## 2015-11-04 ENCOUNTER — Encounter: Payer: Self-pay | Admitting: Family

## 2015-11-04 ENCOUNTER — Ambulatory Visit (INDEPENDENT_AMBULATORY_CARE_PROVIDER_SITE_OTHER): Payer: 59 | Admitting: Family

## 2015-11-04 VITALS — BP 112/62 | HR 68 | Temp 97.9°F | Resp 16 | Ht 63.75 in | Wt 233.0 lb

## 2015-11-04 DIAGNOSIS — E1165 Type 2 diabetes mellitus with hyperglycemia: Secondary | ICD-10-CM

## 2015-11-04 DIAGNOSIS — IMO0001 Reserved for inherently not codable concepts without codable children: Secondary | ICD-10-CM

## 2015-11-04 DIAGNOSIS — E785 Hyperlipidemia, unspecified: Secondary | ICD-10-CM

## 2015-11-04 DIAGNOSIS — I10 Essential (primary) hypertension: Secondary | ICD-10-CM

## 2015-11-04 LAB — LIPID PANEL
CHOL/HDL RATIO: 5
CHOLESTEROL: 185 mg/dL (ref 0–200)
HDL: 37.9 mg/dL — ABNORMAL LOW (ref >39.00)
LDL Cholesterol: 112 mg/dL — ABNORMAL HIGH (ref 0–99)
NonHDL: 147.24
TRIGLYCERIDES: 176 mg/dL — AB (ref 0.0–149.0)
VLDL: 35.2 mg/dL (ref 0.0–40.0)

## 2015-11-04 LAB — COMPREHENSIVE METABOLIC PANEL
ALBUMIN: 4.4 g/dL (ref 3.5–5.2)
ALK PHOS: 81 U/L (ref 39–117)
ALT: 73 U/L — AB (ref 0–35)
AST: 61 U/L — AB (ref 0–37)
BILIRUBIN TOTAL: 0.5 mg/dL (ref 0.2–1.2)
BUN: 18 mg/dL (ref 6–23)
CALCIUM: 9.8 mg/dL (ref 8.4–10.5)
CO2: 31 meq/L (ref 19–32)
CREATININE: 0.64 mg/dL (ref 0.40–1.20)
Chloride: 99 mEq/L (ref 96–112)
GFR: 100.33 mL/min (ref >60.00)
Glucose, Bld: 134 mg/dL — ABNORMAL HIGH (ref 70–99)
Potassium: 3.8 mEq/L (ref 3.5–5.1)
Sodium: 138 mEq/L (ref 135–145)
TOTAL PROTEIN: 7.4 g/dL (ref 6.0–8.3)

## 2015-11-04 LAB — HEMOGLOBIN A1C: HEMOGLOBIN A1C: 7.5 % — AB (ref 4.6–6.5)

## 2015-11-04 MED ORDER — GLUCOSE BLOOD VI STRP: ORAL_STRIP | Status: DC

## 2015-11-04 MED ORDER — ONETOUCH ULTRA MINI W/DEVICE KIT: PACK | Status: DC

## 2015-11-04 NOTE — Patient Instructions (Signed)
Thank you for choosing [facility].  Summary/Instructions:  Please continue to take her medications as prescribed.  Please find out what diabetic medications are on your drug formulary and covered by her insurance.  Continue to monitor blood pressure and blood sugar at home.  Your prescription(s) have been submitted to your pharmacy or been printed and provided for you. Please take as directed and contact our office if you believe you are having problem(s) with the medication(s) or have any questions.  Please stop by the lab on the basement level of the building for your blood work. Your results will be released to [place] (or called to you) after review, usually within 72 hours after test completion. If any changes need to be made, you will be notified at that same time.   th

## 2015-11-04 NOTE — Assessment & Plan Note (Signed)
Hypertension is well-controlled with current regimen and below goal 140/90 with no adverse side effects. Continue current dosage of diltiazem and losartan-hydrochlorothiazide. Continue to monitor blood pressure at home. Follow-up in 3 months.

## 2015-11-04 NOTE — Progress Notes (Signed)
Pre visit review using our clinic review tool, if applicable. No additional management support is needed unless otherwise documented below in the visit note. 

## 2015-11-04 NOTE — Assessment & Plan Note (Signed)
Unknown current status of hyperlipidemia. Maintained on atorvastatin and fenofibrate. Obtain lipid profile. Continue current dosage of atorvastatin and fenofibrate pending lipid profile results.

## 2015-11-04 NOTE — Progress Notes (Signed)
Subjective:    Patient ID: Diane Gregory, female    DOB: [birth year], 60 y.o.   MRN: [id number]  Chief Complaint  Patient presents with  . Follow-up    fasting, wants blood work done    HPI:  Diane Gregory is a 60 y.o. female who  has a past medical history of Hypertension; Restless leg syndrome; Urinary incontinence; Anxiety; GERD (gastroesophageal reflux disease); High triglycerides; Hyperlipidemia; Depression; Sleep apnea; Urgency of urination; Chronic bronchitis ([place]); Walking pneumonia (~ 2013); Type II diabetes mellitus ([place]); Left knee DJD; Primary localized osteoarthritis of right knee; Arthritis; HSV infection; Bronchitis; and Obesity. and presents today for an office follow up.  1.) Type 2 diabetes - currently maintained on glipizide and metformin. Reports taking medications as prescribed and denies adverse side effects or hypoglycemic readings. Blood sugars are around 200 at home. Indicates that she is not doing very well with lifestyle management.   Lab Results  Component Value Date   HGBA1C 9.1* 07/05/2015    2.) Hyperlipidemia - currently managed on atorvastatin and fenofibrate. Reports seeing medication as prescribed and denies adverse side effects or myalgias.  No results found for: CHOL, HDL, LDLCALC, LDLDIRECT, TRIG, CHOLHDL   3.) Hypertension - currently managed with diltiazem and losartan-hydrochlorothiazide. Reports taking the medication as described and denies adverse side effects or hypotensive readings. No symptoms of end organ damage. Reports blood pressure readings at home are below goal of 140/90.  BP Readings from Last 3 Encounters:  11/04/15 112/62  07/05/15 120/64  05/12/15 133/70    Allergies  Allergen Reactions  . Celebrex [Celecoxib] Swelling  . Penicillins Other (See Comments)    Drop in blood pressure     Current Outpatient Prescriptions on File Prior to Visit  Medication Sig Dispense Refill  . acyclovir cream (ZOVIRAX) 5 % Apply 1  application topically every 3 (three) hours as needed (out break).    . Ascorbic Acid (VITAMIN C) 1000 MG tablet Take 1,000 mg by mouth daily.    Marland Kitchen aspirin 81 MG tablet Take 81 mg by mouth daily.    Marland Kitchen atorvastatin (LIPITOR) 10 MG tablet TAKE 1 TABLET BY MOUTH DAILY 30 tablet 0  . diltiazem (CARTIA XT) 240 MG 24 hr capsule Take 1 capsule (240 mg total) by mouth daily. Yearly physical w/labs is due in April must see Marya Amsler for refills 30 capsule 0  . fenofibrate 160 MG tablet TAKE 1 TABLET(160 MG) BY MOUTH DAILY 30 tablet 0  . glipiZIDE (GLUCOTROL XL) 5 MG 24 hr tablet TAKE 1 TABLET(5 MG) BY MOUTH DAILY WITH BREAKFAST 90 tablet 0  . Iron-Vit C-Vit B12-Folic Acid (IRON 242 PLUS PO) Take 65 mg by mouth.    . Lancets (ONETOUCH ULTRASOFT) lancets Use as instructed 100 each 12  . losartan-hydrochlorothiazide (HYZAAR) 100-25 MG tablet TAKE 1 TABLET BY MOUTH DAILY 30 tablet 0  . metFORMIN (GLUCOPHAGE) 500 MG tablet Take 1 tablet (500 mg total) by mouth 2 (two) times daily with a meal. 60 tablet 2  . Multiple Vitamin (MULTIVITAMIN WITH MINERALS) TABS tablet Take 1 tablet by mouth daily.    Marland Kitchen omeprazole (PRILOSEC) 10 MG capsule Take this 30 minutes before breakfast and 30 minutes before the evening meal for 8 weeks 90 capsule 3  . PARoxetine (PAXIL) 40 MG tablet TAKE 1 TABLET BY MOUTH EVERY MORNING 30 tablet 0  . pramipexole (MIRAPEX) 1 MG tablet Take 1 tablet (1 mg total) by mouth at bedtime. 90 tablet 1  .  solifenacin (VESICARE) 10 MG tablet Take 1 tablet (10 mg total) by mouth daily. 90 tablet 1  . TURMERIC PO Take 1 tablet by mouth daily. Takes '300mg'$  daily    . valACYclovir (VALTREX) 1000 MG tablet Take 1,000 mg by mouth as needed (out break).      No current facility-administered medications on file prior to visit.    Past Medical History  Diagnosis Date  . Hypertension   . Restless leg syndrome   . Urinary incontinence   . Anxiety   . GERD (gastroesophageal reflux disease)   . High  triglycerides   . Hyperlipidemia   . Depression   . Sleep apnea     mild; does not wear CPAP  . Urgency of urination     and frequency takes Vesicare  . Chronic bronchitis ([place])     "used to get it q yr; not as much since septal repair in 2004"  . Walking pneumonia ~ 2013  . Type II diabetes mellitus ([place])   . Left knee DJD   . Primary localized osteoarthritis of right knee   . Arthritis     "was in my knees before OR; now in my hands" (05/05/2014)  . HSV infection   . Bronchitis   . Obesity      Past Surgical History  Procedure Laterality Date  . Uvulopalatopharyngoplasty, tonsillectomy and septoplasty  2004  . Knee arthroscopy w/ meniscectomy Right 2010  . Tonsillectomy  ~ 2004  . Refractive surgery Bilateral ~ 2005  . Colonoscopy    . Total knee arthroplasty Left 08/11/2013    Procedure: LEFT TOTAL KNEE ARTHROPLASTY;  Surgeon: Lorn Junes, MD;  Location: [facility];  Service: Orthopedics;  Laterality: Left;  . Hysteroscopy  11/94    submuous myomas  . Total knee arthroplasty Right 05/04/2014    Procedure: RIGHT TOTAL KNEE ARTHROPLASTY;  Surgeon: Lorn Junes, MD;  Location: [facility];  Service: Orthopedics;  Laterality: Right;  . Knee arthroscopy Right ~ 2010  . Vaginal hysterectomy  06/1993  . Nasal septum surgery       Review of Systems  Eyes:       Denies changes in vision  Respiratory: Negative for chest tightness.   Cardiovascular: Negative for chest pain, palpitations and leg swelling.  Endocrine: Negative for polydipsia, polyphagia and polyuria.  Neurological: Negative for numbness.      Objective:    BP 112/62 mmHg  Pulse 68  Temp(Src) 97.9 F (36.6 C) (Oral)  Resp 16  Ht 5' 3.75" (1.619 m)  Wt 233 lb (105.688 kg)  BMI 40.32 kg/m2  SpO2 94% Nursing note and vital signs reviewed.  Physical Exam  Constitutional: She is oriented to person, place, and time. She appears well-developed and well-nourished. No distress.  Cardiovascular: Normal rate,  regular rhythm, normal heart sounds and intact distal pulses.   Pulmonary/Chest: Effort normal and breath sounds normal.  Neurological: She is alert and oriented to person, place, and time.  Skin: Skin is warm and dry.  Psychiatric: She has a normal mood and affect. Her behavior is normal. Judgment and thought content normal.       Assessment & Plan:   Problem List Items Addressed This Visit      Cardiovascular and Mediastinum   Hypertension    Hypertension is well-controlled with current regimen and below goal 140/90 with no adverse side effects. Continue current dosage of diltiazem and losartan-hydrochlorothiazide. Continue to monitor blood pressure at home. Follow-up in 3 months.  Relevant Orders   Comp Met (CMET)     Endocrine   Diabetes type 2, uncontrolled ([place]) - Primary    Diabetes appears uncontrolled with current regimen given home blood sugar readings of greater than 200 as well as previous A1c of 9.1. Obtain A1c and complete metabolic panel. Diabetic prevention is up-to-date. Maintained on atorvastatin and losartan for CAD risk reduction. Continue current dosage of metformin and glipizide pending A1c results.      Relevant Medications   glucose blood (ONE TOUCH ULTRA TEST) test strip   Blood Glucose Monitoring Suppl (ONE TOUCH ULTRA MINI) w/Device KIT   Other Relevant Orders   Hemoglobin A1c   Comp Met (CMET)     Other   Hyperlipidemia    Unknown current status of hyperlipidemia. Maintained on atorvastatin and fenofibrate. Obtain lipid profile. Continue current dosage of atorvastatin and fenofibrate pending lipid profile results.      Relevant Orders   Lipid panel   Comp Met (CMET)

## 2015-11-04 NOTE — Assessment & Plan Note (Signed)
Diabetes appears uncontrolled with current regimen given home blood sugar readings of greater than 200 as well as previous A1c of 9.1. Obtain A1c and complete metabolic panel. Diabetic prevention is up-to-date. Maintained on atorvastatin and losartan for CAD risk reduction. Continue current dosage of metformin and glipizide pending A1c results.

## 2015-11-06 ENCOUNTER — Other Ambulatory Visit: Payer: Self-pay | Admitting: Family

## 2015-11-06 ENCOUNTER — Encounter: Payer: Self-pay | Admitting: Family

## 2015-11-06 MED ORDER — GLIPIZIDE ER 2.5 MG PO TB24: 2.5000 mg | ORAL_TABLET | Freq: Every day | ORAL | Status: DC

## 2015-11-08 ENCOUNTER — Encounter: Payer: Self-pay | Admitting: Family

## 2015-11-10 ENCOUNTER — Ambulatory Visit (INDEPENDENT_AMBULATORY_CARE_PROVIDER_SITE_OTHER): Payer: 59 | Admitting: Obstetrics and Gynecology

## 2015-11-10 ENCOUNTER — Encounter: Payer: Self-pay | Admitting: Obstetrics and Gynecology

## 2015-11-10 VITALS — BP 136/74 | HR 72 | Resp 20 | Ht 64.0 in | Wt 244.8 lb

## 2015-11-10 DIAGNOSIS — Z01419 Encounter for gynecological examination (general) (routine) without abnormal findings: Secondary | ICD-10-CM

## 2015-11-10 DIAGNOSIS — N3941 Urge incontinence: Secondary | ICD-10-CM | POA: Diagnosis not present

## 2015-11-10 NOTE — Progress Notes (Signed)
Patient ID: Diane Gregory, female   DOB: Oct 07, 1955, 60 y.o.   MRN: [id number] 60 y.o. [id number] Single Caucasian female here for annual exam.    Did PT at [facility] for urinary incontinence.  Got bronchitis and feels like she regressed because she stopped doing therapy.  Unable to take time off of work for this.  PT was meeting her needs for urinary control.  Was having a lot of urgency that did improve.  Cutting back on caffeine.  Avoiding carbonated water.  Taking Vesicare 10 mg daily.  Urge is much more important than stress incontinence, which she has in a more minor form.   Does not need refill of Valtrex.  This is oral HSV.  No vaginal outbreak in a really long time.   PCP:  Terri Piedra, ND  No LMP recorded. Patient has had a hysterectomy.           Sexually active: Yes.   female The current method of family planning is status post hysterectomy.  Still has ovaries.   Exercising: Yes.    stationary bike and up and down stairs at work. Smoker:  no  Health Maintenance: Pap:  2000 Neg History of abnormal Pap:  no MMG:  2 weeks ago normal with [facility] Colonoscopy:  05-12-15 polyp with Dr. Monna Fam due 04/2025 BMD:   2007  Result  Normal:?SE Radiology TDaP:  2008 Gardasil:   no HIV:  Had STD testing several years ago and believes this was done about  6 - 7 years.  Hep C:  Negative in 2015. Screening Labs:  Hb today: PCP, Urine today: unable   reports that she has never smoked. She has never used smokeless tobacco. She reports that she drinks about 0.6 oz of alcohol per week. She reports that she does not use illicit drugs.  Past Medical History  Diagnosis Date  . Hypertension   . Restless leg syndrome   . Urinary incontinence   . Anxiety   . GERD (gastroesophageal reflux disease)   . High triglycerides   . Hyperlipidemia   . Depression   . Sleep apnea     mild; does not wear CPAP  . Urgency of urination     and frequency takes Vesicare  . Chronic bronchitis ([place])      "used to get it q yr; not as much since septal repair in 2004"  . Walking pneumonia ~ 2013  . Type II diabetes mellitus ([place])   . Left knee DJD   . Primary localized osteoarthritis of right knee   . Arthritis     "was in my knees before OR; now in my hands" (05/05/2014)  . HSV infection   . Bronchitis   . Obesity     Past Surgical History  Procedure Laterality Date  . Uvulopalatopharyngoplasty, tonsillectomy and septoplasty  2004  . Knee arthroscopy w/ meniscectomy Right 2010  . Tonsillectomy  ~ 2004  . Refractive surgery Bilateral ~ 2005  . Colonoscopy    . Total knee arthroplasty Left 08/11/2013    Procedure: LEFT TOTAL KNEE ARTHROPLASTY;  Surgeon: Lorn Junes, MD;  Location: [facility];  Service: Orthopedics;  Laterality: Left;  . Hysteroscopy  11/94    submuous myomas  . Total knee arthroplasty Right 05/04/2014    Procedure: RIGHT TOTAL KNEE ARTHROPLASTY;  Surgeon: Lorn Junes, MD;  Location: [facility];  Service: Orthopedics;  Laterality: Right;  . Knee arthroscopy Right ~ 2010  . Vaginal hysterectomy  06/1993  .  Nasal septum surgery      Current Outpatient Prescriptions  Medication Sig Dispense Refill  . acyclovir cream (ZOVIRAX) 5 % Apply 1 application topically every 3 (three) hours as needed (out break).    . Ascorbic Acid (VITAMIN C) 1000 MG tablet Take 1,000 mg by mouth daily.    Marland Kitchen aspirin 81 MG tablet Take 81 mg by mouth daily.    Marland Kitchen atorvastatin (LIPITOR) 10 MG tablet TAKE 1 TABLET BY MOUTH DAILY 30 tablet 0  . Blood Glucose Monitoring Suppl (ONE TOUCH ULTRA MINI) w/Device KIT Use glucose meter 1-4 times daily as instructed to check blood sugar. 1 each 0  . diltiazem (CARTIA XT) 240 MG 24 hr capsule Take 1 capsule (240 mg total) by mouth daily. Yearly physical w/labs is due in April must see Marya Amsler for refills 30 capsule 0  . fenofibrate 160 MG tablet TAKE 1 TABLET(160 MG) BY MOUTH DAILY 30 tablet 0  . glipiZIDE (GLUCOTROL XL) 2.5 MG 24 hr tablet Take 1 tablet (2.5  mg total) by mouth daily with breakfast. 90 tablet 0  . glipiZIDE (GLUCOTROL XL) 5 MG 24 hr tablet TAKE 1 TABLET(5 MG) BY MOUTH DAILY WITH BREAKFAST 90 tablet 0  . glucose blood (ONE TOUCH ULTRA TEST) test strip Use as instructed 100 each 12  . Iron-Vit C-Vit B12-Folic Acid (IRON 161 PLUS PO) Take 65 mg by mouth.    . Lancets (ONETOUCH ULTRASOFT) lancets Use as instructed 100 each 12  . losartan-hydrochlorothiazide (HYZAAR) 100-25 MG tablet TAKE 1 TABLET BY MOUTH DAILY 30 tablet 0  . metFORMIN (GLUCOPHAGE) 500 MG tablet Take 1 tablet (500 mg total) by mouth 2 (two) times daily with a meal. 60 tablet 2  . Multiple Vitamin (MULTIVITAMIN WITH MINERALS) TABS tablet Take 1 tablet by mouth daily.    Marland Kitchen omeprazole (PRILOSEC) 10 MG capsule Take this 30 minutes before breakfast and 30 minutes before the evening meal for 8 weeks 90 capsule 3  . PARoxetine (PAXIL) 40 MG tablet TAKE 1 TABLET BY MOUTH EVERY MORNING 30 tablet 0  . pramipexole (MIRAPEX) 1 MG tablet Take 1 tablet (1 mg total) by mouth at bedtime. 90 tablet 1  . solifenacin (VESICARE) 10 MG tablet Take 1 tablet (10 mg total) by mouth daily. 90 tablet 1  . TURMERIC PO Take 1 tablet by mouth daily. Takes '300mg'$  daily    . valACYclovir (VALTREX) 1000 MG tablet Take 1,000 mg by mouth as needed (out break).      No current facility-administered medications for this visit.    Family History  Problem Relation Age of Onset  . Heart disease Mother   . Hypertension Mother   . Stroke Mother   . Breast cancer Mother   . Hypertension Brother   . Diabetes Mellitus II Father   . Cancer Father     CLL  . Breast cancer Paternal Grandmother   . Breast cancer Maternal Grandmother     ROS:  Pertinent items are noted in HPI.  Otherwise, a comprehensive ROS was negative.  Exam:   BP 136/74 mmHg  Pulse 72  Resp 20  Ht '5\' 4"'$  (1.626 m)  Wt 244 lb 12.8 oz (111.041 kg)  BMI 42.00 kg/m2    General appearance: alert, cooperative and appears stated  age Head: Normocephalic, without obvious abnormality, atraumatic Neck: no adenopathy, supple, symmetrical, trachea midline and thyroid normal to inspection and palpation Lungs: clear to auscultation bilaterally Breasts: normal appearance, no masses or tenderness, Inspection negative, No  nipple retraction or dimpling, No nipple discharge or bleeding, No axillary or supraclavicular adenopathy Heart: regular rate and rhythm Abdomen: incisions:  No.    , soft, non-tender; no masses, no organomegaly Extremities: extremities normal, atraumatic, no cyanosis or edema Skin: Skin color, texture, turgor normal. No rashes or lesions Lymph nodes: Cervical, supraclavicular, and axillary nodes normal. No abnormal inguinal nodes palpated Neurologic: Grossly normal  Pelvic: External genitalia:  no lesions              Urethra:  normal appearing urethra with no masses, tenderness or lesions              Bartholins and Skenes: normal                 Vagina: normal appearing vagina with normal color and discharge, no lesions              Cervix: absent              Pap taken: No. Bimanual Exam:  Uterus:  uterus absent              Adnexa: no mass, fullness, tenderness              Rectal exam: Yes.    Confirms.              Anus:  normal sphincter tone, no lesions  Chaperone was present for exam.  Assessment:   Well woman visit with normal exam. Family history of breast cancer.  Mixed incontinence. Overactive bladder is more important component. Obesity.  History of HSV, probably type I.  Diabetes mellitus.   Plan: Yearly mammogram recommended after age 67.  Recommended self breast exam.  Pap and HR HPV as above. Discussed Calcium, Vitamin D, regular exercise program including cardiovascular and weight bearing exercise. Labs performed.  No..   See orders. Prescription medication(s) given.  No..  See orders. Return to PT when able.  Follow up annually and prn.   Additional counseling given.   Yes.  Discussion of overactive bladder and avoidance of bladder irritants.  Continue Vesicare.  Encouraged good control of diabetes and increased exercise.      After visit summary provided.

## 2015-11-10 NOTE — Patient Instructions (Signed)

## 2015-11-15 ENCOUNTER — Other Ambulatory Visit: Payer: Self-pay | Admitting: Family

## 2015-11-24 ENCOUNTER — Other Ambulatory Visit: Payer: Self-pay | Admitting: Family

## 2015-12-04 ENCOUNTER — Emergency Department (HOSPITAL_BASED_OUTPATIENT_CLINIC_OR_DEPARTMENT_OTHER): Payer: 59

## 2015-12-04 ENCOUNTER — Emergency Department (HOSPITAL_BASED_OUTPATIENT_CLINIC_OR_DEPARTMENT_OTHER)
Admission: EM | Admit: 2015-12-04 | Discharge: 2015-12-04 | Disposition: A | Discharge status: Home / Self Care | Payer: 59 | Attending: Emergency Medicine | Admitting: Emergency Medicine

## 2015-12-04 ENCOUNTER — Encounter (HOSPITAL_BASED_OUTPATIENT_CLINIC_OR_DEPARTMENT_OTHER): Payer: Self-pay | Admitting: *Deleted

## 2015-12-04 DIAGNOSIS — R519 Headache, unspecified: Secondary | ICD-10-CM

## 2015-12-04 DIAGNOSIS — F329 Major depressive disorder, single episode, unspecified: Secondary | ICD-10-CM | POA: Diagnosis not present

## 2015-12-04 DIAGNOSIS — I10 Essential (primary) hypertension: Secondary | ICD-10-CM | POA: Diagnosis not present

## 2015-12-04 DIAGNOSIS — K219 Gastro-esophageal reflux disease without esophagitis: Secondary | ICD-10-CM | POA: Insufficient documentation

## 2015-12-04 DIAGNOSIS — R51 Headache: Secondary | ICD-10-CM | POA: Diagnosis present

## 2015-12-04 DIAGNOSIS — E119 Type 2 diabetes mellitus without complications: Secondary | ICD-10-CM | POA: Diagnosis not present

## 2015-12-04 DIAGNOSIS — Z8709 Personal history of other diseases of the respiratory system: Secondary | ICD-10-CM | POA: Diagnosis not present

## 2015-12-04 DIAGNOSIS — M1990 Unspecified osteoarthritis, unspecified site: Secondary | ICD-10-CM | POA: Diagnosis not present

## 2015-12-04 DIAGNOSIS — H43391 Other vitreous opacities, right eye: Secondary | ICD-10-CM | POA: Insufficient documentation

## 2015-12-04 DIAGNOSIS — E669 Obesity, unspecified: Secondary | ICD-10-CM | POA: Diagnosis not present

## 2015-12-04 DIAGNOSIS — F419 Anxiety disorder, unspecified: Secondary | ICD-10-CM | POA: Diagnosis not present

## 2015-12-04 DIAGNOSIS — Z880 Allergy status to penicillin: Secondary | ICD-10-CM | POA: Insufficient documentation

## 2015-12-04 DIAGNOSIS — Z8701 Personal history of pneumonia (recurrent): Secondary | ICD-10-CM | POA: Diagnosis not present

## 2015-12-04 DIAGNOSIS — Z7984 Long term (current) use of oral hypoglycemic drugs: Secondary | ICD-10-CM | POA: Insufficient documentation

## 2015-12-04 DIAGNOSIS — G4730 Sleep apnea, unspecified: Secondary | ICD-10-CM | POA: Diagnosis not present

## 2015-12-04 DIAGNOSIS — E785 Hyperlipidemia, unspecified: Secondary | ICD-10-CM | POA: Insufficient documentation

## 2015-12-04 DIAGNOSIS — G2581 Restless legs syndrome: Secondary | ICD-10-CM | POA: Insufficient documentation

## 2015-12-04 DIAGNOSIS — Z8619 Personal history of other infectious and parasitic diseases: Secondary | ICD-10-CM | POA: Diagnosis not present

## 2015-12-04 DIAGNOSIS — E781 Pure hyperglyceridemia: Secondary | ICD-10-CM | POA: Diagnosis not present

## 2015-12-04 DIAGNOSIS — Z7982 Long term (current) use of aspirin: Secondary | ICD-10-CM | POA: Diagnosis not present

## 2015-12-04 LAB — COMPREHENSIVE METABOLIC PANEL
ALT: 106 U/L — AB (ref 14–54)
AST: 123 U/L — AB (ref 15–41)
Albumin: 4.1 g/dL (ref 3.5–5.0)
Alkaline Phosphatase: 94 U/L (ref 38–126)
Anion gap: 11 (ref 5–15)
BILIRUBIN TOTAL: 0.6 mg/dL (ref 0.3–1.2)
BUN: 16 mg/dL (ref 6–20)
CHLORIDE: 101 mmol/L (ref 101–111)
CO2: 25 mmol/L (ref 22–32)
CREATININE: 0.57 mg/dL (ref 0.44–1.00)
Calcium: 9.3 mg/dL (ref 8.9–10.3)
GFR calc Af Amer: >60 mL/min (ref >60)
Glucose, Bld: 238 mg/dL — ABNORMAL HIGH (ref 65–99)
Potassium: 3.9 mmol/L (ref 3.5–5.1)
Sodium: 137 mmol/L (ref 135–145)
Total Protein: 7.4 g/dL (ref 6.5–8.1)

## 2015-12-04 LAB — URINALYSIS, ROUTINE W REFLEX MICROSCOPIC
BILIRUBIN URINE: NEGATIVE
GLUCOSE, UA: NEGATIVE mg/dL
HGB URINE DIPSTICK: NEGATIVE
KETONES UR: NEGATIVE mg/dL
LEUKOCYTES UA: NEGATIVE
NITRITE: NEGATIVE
PH: 6 (ref 5.0–8.0)
Protein, ur: NEGATIVE mg/dL
Specific Gravity, Urine: 1.007 (ref 1.005–1.030)

## 2015-12-04 LAB — RAPID URINE DRUG SCREEN, HOSP PERFORMED
Amphetamines: NOT DETECTED
Barbiturates: NOT DETECTED
Benzodiazepines: NOT DETECTED
Cocaine: NOT DETECTED
OPIATES: NOT DETECTED
TETRAHYDROCANNABINOL: NOT DETECTED

## 2015-12-04 LAB — PROTIME-INR
INR: 1 (ref 0.00–1.49)
Prothrombin Time: 13.4 seconds (ref 11.6–15.2)

## 2015-12-04 LAB — DIFFERENTIAL
BASOS ABS: 0 10*3/uL (ref 0.0–0.1)
Basophils Relative: 0 %
Eosinophils Absolute: 0.2 10*3/uL (ref 0.0–0.7)
Eosinophils Relative: 2 %
LYMPHS ABS: 2 10*3/uL (ref 0.7–4.0)
Lymphocytes Relative: 25 %
MONOS PCT: 9 %
Monocytes Absolute: 0.8 10*3/uL (ref 0.1–1.0)
NEUTROS ABS: 5.2 10*3/uL (ref 1.7–7.7)
Neutrophils Relative %: 64 %

## 2015-12-04 LAB — TROPONIN I: Troponin I: <0.03 ng/mL (ref <0.031)

## 2015-12-04 LAB — CBC
HEMATOCRIT: 41.8 % (ref 36.0–46.0)
HEMOGLOBIN: 14.3 g/dL (ref 12.0–15.0)
MCH: 29.2 pg (ref 26.0–34.0)
MCHC: 34.2 g/dL (ref 30.0–36.0)
MCV: 85.3 fL (ref 78.0–100.0)
Platelets: 217 10*3/uL (ref 150–400)
RBC: 4.9 MIL/uL (ref 3.87–5.11)
RDW: 13.9 % (ref 11.5–15.5)
WBC: 8.2 10*3/uL (ref 4.0–10.5)

## 2015-12-04 LAB — APTT: APTT: 31 s (ref 24–37)

## 2015-12-04 LAB — ETHANOL: Alcohol, Ethyl (B): <5 mg/dL (ref <5)

## 2015-12-04 MED ORDER — DIPHENHYDRAMINE HCL 50 MG/ML IJ SOLN
25.0000 mg | Freq: Once | INTRAMUSCULAR | Status: AC
  Administered 2015-12-04: 25 mg via INTRAVENOUS
  Filled 2015-12-04: qty 1

## 2015-12-04 MED ORDER — LORAZEPAM 2 MG/ML IJ SOLN
1.0000 mg | Freq: Once | INTRAMUSCULAR | Status: AC
  Administered 2015-12-04: 1 mg via INTRAVENOUS
  Filled 2015-12-04: qty 1

## 2015-12-04 MED ORDER — IOPAMIDOL (ISOVUE-370) INJECTION 76%
100.0000 mL | Freq: Once | INTRAVENOUS | Status: AC | PRN
  Administered 2015-12-04: 100 mL via INTRAVENOUS

## 2015-12-04 MED ORDER — METOCLOPRAMIDE HCL 5 MG/ML IJ SOLN
10.0000 mg | Freq: Once | INTRAMUSCULAR | Status: AC
  Administered 2015-12-04: 10 mg via INTRAVENOUS
  Filled 2015-12-04: qty 2

## 2015-12-04 NOTE — ED Notes (Signed)
Pt given d/c instructions as per chart. Verbalizes understanding. No questions. 

## 2015-12-04 NOTE — ED Notes (Signed)
FAST neuro assessment: negative 

## 2015-12-04 NOTE — ED Notes (Signed)
Pt reports that on Wednesday she began feeling pressure at bilateral temples. Also reports headache at base of her skull; endorses intermittent light-headedness and floaters in R eye 1 week. Denies fever, vomiting;reports nausea and diarrhea.

## 2015-12-04 NOTE — ED Provider Notes (Addendum)
CSN: [id number]     Arrival date & time 12/04/15  1252 History   First MD Initiated Contact with Patient 12/04/15 1356     Chief Complaint  Patient presents with  . Headache     (Consider location/radiation/quality/duration/timing/severity/associated sxs/prior Treatment) HPI  60 year old female presents with a chief complaint of posterior headache. She states this has been on and off over the last 4-5 days. Headache is dull. For about one week she's been having right eye floaters. Floaters go away whenever she closer right eye. It is diffuse and one time she thought there were a lot of gnats around her. There is no vision change however. No ocular pain. She sometimes gets lightheaded whenever the headache comes. There is no specific photophobia but she feels somewhat better when the room is dark. For now the headache is about a 3/10. The headache has never been very severe. It gradually comes on and then gradually improves. Sometimes it will last for hours. No weakness or numbness. No chest pain or shortness of breath. No neck stiffness.  Past Medical History  Diagnosis Date  . Hypertension   . Restless leg syndrome   . Urinary incontinence   . Anxiety   . GERD (gastroesophageal reflux disease)   . High triglycerides   . Hyperlipidemia   . Depression   . Sleep apnea     mild; does not wear CPAP  . Urgency of urination     and frequency takes Vesicare  . Chronic bronchitis ([place])     "used to get it q yr; not as much since septal repair in 2004"  . Walking pneumonia ~ 2013  . Type II diabetes mellitus ([place])   . Left knee DJD   . Primary localized osteoarthritis of right knee   . Arthritis     "was in my knees before OR; now in my hands" (05/05/2014)  . HSV infection   . Bronchitis   . Obesity    Past Surgical History  Procedure Laterality Date  . Uvulopalatopharyngoplasty, tonsillectomy and septoplasty  2004  . Knee arthroscopy w/ meniscectomy Right 2010  . Tonsillectomy  ~  2004  . Refractive surgery Bilateral ~ 2005  . Colonoscopy    . Total knee arthroplasty Left 08/11/2013    Procedure: LEFT TOTAL KNEE ARTHROPLASTY;  Surgeon: Lorn Junes, MD;  Location: [facility];  Service: Orthopedics;  Laterality: Left;  . Hysteroscopy  11/94    submuous myomas  . Total knee arthroplasty Right 05/04/2014    Procedure: RIGHT TOTAL KNEE ARTHROPLASTY;  Surgeon: Lorn Junes, MD;  Location: [facility];  Service: Orthopedics;  Laterality: Right;  . Knee arthroscopy Right ~ 2010  . Vaginal hysterectomy  06/1993  . Nasal septum surgery     Family History  Problem Relation Age of Onset  . Heart disease Mother   . Hypertension Mother   . Stroke Mother   . Breast cancer Mother   . Hypertension Brother   . Diabetes Mellitus II Father   . Cancer Father     CLL  . Breast cancer Paternal Grandmother   . Breast cancer Maternal Grandmother    Social History  Substance Use Topics  . Smoking status: Never Smoker   . Smokeless tobacco: Never Used  . Alcohol Use: 0.6 oz/week    1 Standard drinks or equivalent per week     Comment: 05/05/2014 "might have a drink once/month"   OB History    Gravida Para Term Preterm AB  TAB SAB Ectopic Multiple Living   '2 2 2       2     '$ Review of Systems  Eyes: Positive for visual disturbance. Negative for photophobia, pain and redness.  Respiratory: Negative for shortness of breath.   Cardiovascular: Negative for chest pain.  Gastrointestinal: Negative for abdominal pain.  Neurological: Positive for headaches. Negative for weakness and numbness.  All other systems reviewed and are negative.     Allergies  Celebrex; Lidocaine; Lisinopril; Meloxicam; Nsaids; and Penicillins  Home Medications   Prior to Admission medications   Medication Sig Start Date End Date Taking? Authorizing Provider  atorvastatin (LIPITOR) 10 MG tablet TAKE 1 TABLET BY MOUTH DAILY 04/20/15  Yes Golden Circle, FNP  Blood Glucose Monitoring Suppl (ONE TOUCH  ULTRA MINI) w/Device KIT Use glucose meter 1-4 times daily as instructed to check blood sugar. 11/04/15  Yes Golden Circle, FNP  CARTIA XT 240 MG 24 hr capsule TAKE 1 CAPSULE BY MOUTH ONCE DAILY 11/16/15  Yes Golden Circle, FNP  fenofibrate 160 MG tablet TAKE 1 TABLET(160 MG) BY MOUTH DAILY 09/20/15  Yes Golden Circle, FNP  fenofibrate 160 MG tablet TAKE 1 TABLET(160 MG) BY MOUTH DAILY 11/24/15  Yes Golden Circle, FNP  glipiZIDE (GLUCOTROL XL) 2.5 MG 24 hr tablet Take 1 tablet (2.5 mg total) by mouth daily with breakfast. 11/06/15  Yes Golden Circle, FNP  glipiZIDE (GLUCOTROL XL) 2.5 MG 24 hr tablet Take 2.5 mg by mouth daily with breakfast.   Yes Historical Provider, MD  glipiZIDE (GLUCOTROL XL) 5 MG 24 hr tablet TAKE 1 TABLET(5 MG) BY MOUTH DAILY WITH BREAKFAST 09/20/15  Yes Golden Circle, FNP  glucose blood (ONE TOUCH ULTRA TEST) test strip Use as instructed 11/04/15  Yes Golden Circle, FNP  Lancets [facility] ULTRASOFT) lancets Use as instructed 07/05/15  Yes Golden Circle, FNP  losartan-hydrochlorothiazide (HYZAAR) 100-25 MG tablet TAKE 1 TABLET BY MOUTH DAILY 08/13/15  Yes Golden Circle, FNP  metFORMIN (GLUCOPHAGE) 500 MG tablet TAKE 1 TABLET BY MOUTH TWICE DAILY WITH A MEAL 11/16/15  Yes Golden Circle, FNP  omeprazole (PRILOSEC) 10 MG capsule Take this 30 minutes before breakfast and 30 minutes before the evening meal for 8 weeks 07/05/15  Yes Golden Circle, FNP  PARoxetine (PAXIL) 40 MG tablet TAKE 1 TABLET BY MOUTH EVERY MORNING 04/21/15  Yes Golden Circle, FNP  pramipexole (MIRAPEX) 1 MG tablet Take 1 tablet (1 mg total) by mouth at bedtime. 10/22/15  Yes Golden Circle, FNP  acyclovir cream (ZOVIRAX) 5 % Apply 1 application topically every 3 (three) hours as needed (out break).    Historical Provider, MD  Ascorbic Acid (VITAMIN C) 1000 MG tablet Take 1,000 mg by mouth daily.    Historical Provider, MD  aspirin 81 MG tablet Take 81 mg by mouth daily.    Historical  Provider, MD  Iron-Vit C-Vit B12-Folic Acid (IRON 962 PLUS PO) Take 65 mg by mouth.    Historical Provider, MD  Multiple Vitamin (MULTIVITAMIN WITH MINERALS) TABS tablet Take 1 tablet by mouth daily.    Historical Provider, MD  solifenacin (VESICARE) 10 MG tablet Take 1 tablet (10 mg total) by mouth daily. 06/07/15   Biagio Borg, MD  TURMERIC PO Take 1 tablet by mouth daily. Takes '300mg'$  daily    Historical Provider, MD  valACYclovir (VALTREX) 1000 MG tablet Take 1,000 mg by mouth as needed (out break).  Historical Provider, MD   BP 139/60 mmHg  Pulse 76  Temp(Src) 98 F (36.7 C) (Oral)  Resp 18  Ht '5\' 4"'$  (1.626 m)  Wt 245 lb (111.131 kg)  BMI 42.03 kg/m2  SpO2 95% Physical Exam  Constitutional: She is oriented to person, place, and time. She appears well-developed and well-nourished.  HENT:  Head: Normocephalic and atraumatic.  Right Ear: External ear normal.  Left Ear: External ear normal.  Nose: Nose normal.  No tenderness to scalp  Eyes: EOM are normal. Pupils are equal, round, and reactive to light. Right eye exhibits no discharge. Left eye exhibits no discharge.  Neck: Normal range of motion. Neck supple.  No meningismus  Cardiovascular: Normal rate, regular rhythm and normal heart sounds.   Pulmonary/Chest: Effort normal and breath sounds normal.  Abdominal: Soft. There is no tenderness.  Neurological: She is alert and oriented to person, place, and time.  CN 2-12 grossly intact. 5/5 strength in all 4 extremities. Grossly normal sensation. Normal finger to nose  Skin: Skin is warm and dry.  Nursing note and vitals reviewed.   ED Course  Procedures (including critical care time) Labs Review Labs Reviewed  COMPREHENSIVE METABOLIC PANEL - Abnormal; Notable for the following:    Glucose, Bld 238 (*)    AST 123 (*)    ALT 106 (*)    All other components within normal limits  ETHANOL  PROTIME-INR  APTT  CBC  DIFFERENTIAL  URINE RAPID DRUG SCREEN, HOSP PERFORMED   URINALYSIS, ROUTINE W REFLEX MICROSCOPIC (NOT AT [facility])  TROPONIN I    Imaging Review Ct Angio Head W/cm &/or Wo Cm  12/04/2015  CLINICAL DATA:  60 year old female with occipital and bitemporal headache for 3 days with dizziness and visual changes. Initial encounter. EXAM: CT ANGIOGRAPHY HEAD AND NECK TECHNIQUE: Multidetector CT imaging of the head and neck was performed using the standard protocol during bolus administration of intravenous contrast. Multiplanar CT image reconstructions and MIPs were obtained to evaluate the vascular anatomy. Carotid stenosis measurements (when applicable) are obtained utilizing NASCET criteria, using the distal internal carotid diameter as the denominator. CONTRAST:  100 mL Isovue 370 COMPARISON:  None. FINDINGS: CT HEAD Brain: Cerebral volume is within normal limits for age. No midline shift, ventriculomegaly, mass effect, evidence of mass lesion, intracranial hemorrhage or evidence of cortically based acute infarction. Gray-white matter differentiation is within normal limits throughout the brain. Calvarium and skull base: Negative. Paranasal sinuses: Minimal paranasal sinus mucosal thickening, including left maxillary retention cyst. Orbits: No acute orbit or scalp soft tissue findings. CTA NECK Skeleton: Intermittent degenerative changes in the cervical spine including facet and endplate spurring. No acute osseous abnormality identified. Other neck: Mild atelectasis in the visualized lungs. No superior mediastinal lymphadenopathy. Thyroid, larynx, pharynx, parapharyngeal spaces, retropharyngeal space, sublingual space, submandibular glands, and parotid glands are within normal limits. No cervical lymphadenopathy. Aortic arch: 3 vessel arch configuration.  No arch atherosclerosis. Right carotid system: Mildly tortuous right CCA. Negative right carotid bifurcation and cervical right ICA. Left carotid system: Mildly tortuous left CCA. Negative left carotid bifurcation.  Mildly tortuous left cervical ICA. Vertebral arteries: Mildly tortuous proximal right subclavian artery without stenosis. Normal right vertebral artery origin. Normal right vertebral artery to the skullbase. No proximal left subclavian artery stenosis. Normal left vertebral artery origin. Mildly tortuous left V1 segment. Negative left vertebral artery to the skullbase. CTA HEAD Posterior circulation: Codominant distal vertebral arteries. Normal PICA origins. No distal vertebral stenosis. Normal vertebrobasilar junction. No basilar  artery stenosis. Normal SCA origins. Fetal type bilateral PCA origins, more so the left. Bilateral PCA branches are within normal limits. Anterior circulation: Both ICA siphons are patent. Mild right greater than left calcified plaque. Normal ophthalmic and posterior communicating artery origins. Patent carotid termini. Normal MCA and ACA origins. Anterior communicating artery is normal. There appears to be mild motion artifact in the mid ACA A2 segment which is felt to account for the suggestion of moderate stenosis in both vessels at that level (series 19, image 31). Distal ACA branches appear normal. Left MCA M1 segment, bifurcation, and left MCA branches are within normal limits. Right MCA M1 segment, bifurcation, and right MCA branches are within normal limits. Venous sinuses: Patent. Anatomic variants: Fetal type bilateral PCA origins. Delayed phase: No abnormal enhancement identified. IMPRESSION: 1. Negative for age arterial findings on CTA Head and Neck; no atherosclerosis at the carotid bifurcations, mild calcified plaque in both ICA siphons without stenosis, and suggestion of focal bilateral ACA A2 segment stenosis is felt to be motion artifact. 2. Normal for age CT appearance of the brain. 3. No acute finding identified in the neck. Electronically Signed   By: Genevie Ann M.D.   On: 12/04/2015 16:10   I have personally reviewed and evaluated these images and lab results as part of  my medical decision-making.   EKG Interpretation   Date/Time:  Saturday December 04 2015 14:19:15 EDT Ventricular Rate:  79 PR Interval:  205 QRS Duration: 91 QT Interval:  423 QTC Calculation: 485 R Axis:   55 Text Interpretation:  Sinus rhythm Borderline prolonged PR interval Low  voltage, precordial leads Nonspecific T abnrm, anterolateral leads no  significant change since 2014 Confirmed by Xzaiver Vayda  MD, Saleema Weppler ([id number]) on  12/04/2015 3:00:26 PM      MDM   Final diagnoses:  Occipital headache  Floaters in visual field, right    Patient with waxing and waning headache for several days. No focal neurologic deficits. Unclear if her right eye floaters are related or not. Discussed with Dr. Silverio Decamp of neurology who recommends CT angios of head and neck in addition to CT head. Does not feel MRI would be warranted. Main goal to rule out aneurysm, mass, etc. CTs are negative. Visual acuity normal. Patient's headache feels better although she thinks the floaters might still be present. She hasn't ophthalmologist she can follow-up with, think after one week of symptoms she can follow-up with them in the next couple days. We'll also give neurology referral. Did not feel any other emergent workup is indicated at this time, follow-up with PCP. I doubt SAH given milder pain with waxing and waning, gradual symptoms. She was given IV Ativan because she started having a panic attack while in the ED and having her restless legs flareup. She is now requesting to go, will discharge with return precautions.    Sherwood Gambler, MD 12/04/15 1621  Sherwood Gambler, MD 12/04/15 [phone]

## 2015-12-04 NOTE — ED Notes (Signed)
NPO since 1130hrs today

## 2015-12-04 NOTE — ED Notes (Signed)
Patient transported to CT off cardiac monitor per EDP orders, pt on stretcher, sr x 2 up

## 2015-12-04 NOTE — ED Notes (Signed)
Pt placed on cont cardiac monitoring with cont POX and int NBP

## 2015-12-08 ENCOUNTER — Encounter: Payer: Self-pay | Admitting: Family

## 2015-12-08 ENCOUNTER — Ambulatory Visit (INDEPENDENT_AMBULATORY_CARE_PROVIDER_SITE_OTHER): Payer: 59 | Admitting: Family

## 2015-12-08 VITALS — BP 102/70 | HR 85 | Temp 98.3°F | Resp 16 | Ht 64.0 in | Wt 242.0 lb

## 2015-12-08 DIAGNOSIS — H539 Unspecified visual disturbance: Secondary | ICD-10-CM | POA: Diagnosis not present

## 2015-12-08 DIAGNOSIS — R51 Headache: Secondary | ICD-10-CM

## 2015-12-08 DIAGNOSIS — R519 Headache, unspecified: Secondary | ICD-10-CM | POA: Insufficient documentation

## 2015-12-08 MED ORDER — LORAZEPAM 0.5 MG PO TABS: ORAL_TABLET | ORAL | Status: DC

## 2015-12-08 MED ORDER — METFORMIN HCL 500 MG PO TABS: 500.0000 mg | ORAL_TABLET | Freq: Two times a day (BID) | ORAL | Status: DC

## 2015-12-08 MED ORDER — SOLIFENACIN SUCCINATE 10 MG PO TABS: 10.0000 mg | ORAL_TABLET | Freq: Every day | ORAL | Status: DC

## 2015-12-08 NOTE — Assessment & Plan Note (Signed)
Visual disturbance in one eye with no significant findings by ophthalmology combined with occipital headache with concern for possible intracranial pathology. Obtain TSH. All other blood work reviewed in the emergency department is within normal limits with no evidence of infection. Neurological exam is intact today. Obtain CT of the head to rule out intracranial pathology. Has follow-up with neurology already scheduled. Follow-up for worst headache of her life or if symptoms worsen or do not improve.

## 2015-12-08 NOTE — Patient Instructions (Addendum)
Thank you for choosing [facility].  Summary/Instructions:  Please continue to take your medications as prescribed.  They will call to schedule your CT scan.  Use lorazepam as needed for anxiety prior to the CT scan.  If your symptoms worsen or seek emergency care  Your prescription(s) have been submitted to your pharmacy or been printed and provided for you. Please take as directed and contact our office if you believe you are having problem(s) with the medication(s) or have any questions.  Please stop by the lab on the basement level of the building for your blood work. Your results will be released to [place] (or called to you) after review, usually within 72 hours after test completion. If any changes need to be made, you will be notified at that same time.  Please stop by radiology on the basement level of the building for your x-rays. Your results will be released to [place] (or called to you) after review, usually within 72 hours after test completion. If any treatments or changes are necessary, you will be notified at that same time.  If your symptoms worsen or fail to improve, please contact our office for further instruction, or in case of emergency go directly to the emergency room at the closest medical facility.

## 2015-12-08 NOTE — Progress Notes (Signed)
Subjective:    Patient ID: Diane Gregory, female    DOB: 10/06/1955, 60 y.o.   MRN: [id number]  Chief Complaint  Patient presents with  . Nausea    has been having nausea, floaters in right eye, pressure in the back of head, thinks it could be stress of work    HPI:  Diane Gregory is a 60 y.o. female who  has a past medical history of Hypertension; Restless leg syndrome; Urinary incontinence; Anxiety; GERD (gastroesophageal reflux disease); High triglycerides; Hyperlipidemia; Depression; Sleep apnea; Urgency of urination; Chronic bronchitis ([place]); Walking pneumonia (~ 2013); Type II diabetes mellitus ([place]); Left knee DJD; Primary localized osteoarthritis of right knee; Arthritis; HSV infection; Bronchitis; and Obesity. and presents today for an acute office visit.  This is a new problem. Associated symptom of nausea, floaters in her right eye and pressure in the back of her head has been going on for about 2 weeks ago. She has been seen by opthalmology with no significant finding. Decreased appetite with tolerance to crackers and cheese toast as well as a couple of eggs this morning. Floaters are there constantly with the nausea coming in waves. Endorses increased fatigue and sleeping. Two recent episodes of visual hallucinations. Does endorse neck pain that comes and goes. No fevers.   Allergies  Allergen Reactions  . Celebrex [Celecoxib] Swelling  . Lidocaine Other (See Comments)    "bad taste in mouth" with dental work  . Lisinopril Cough  . Meloxicam Swelling  . Nsaids Swelling  . Penicillins Other (See Comments)    Drop in blood pressure     Current Outpatient Prescriptions on File Prior to Visit  Medication Sig Dispense Refill  . acyclovir cream (ZOVIRAX) 5 % Apply 1 application topically every 3 (three) hours as needed (out break).    . Ascorbic Acid (VITAMIN C) 1000 MG tablet Take 1,000 mg by mouth daily.    Marland Kitchen aspirin 81 MG tablet Take 81 mg by mouth daily.    Marland Kitchen atorvastatin  (LIPITOR) 10 MG tablet TAKE 1 TABLET BY MOUTH DAILY 30 tablet 0  . Blood Glucose Monitoring Suppl (ONE TOUCH ULTRA MINI) w/Device KIT Use glucose meter 1-4 times daily as instructed to check blood sugar. 1 each 0  . CARTIA XT 240 MG 24 hr capsule TAKE 1 CAPSULE BY MOUTH ONCE DAILY 30 capsule 0  . fenofibrate 160 MG tablet TAKE 1 TABLET(160 MG) BY MOUTH DAILY 30 tablet 0  . glipiZIDE (GLUCOTROL XL) 2.5 MG 24 hr tablet Take 1 tablet (2.5 mg total) by mouth daily with breakfast. 90 tablet 0  . glipiZIDE (GLUCOTROL XL) 5 MG 24 hr tablet TAKE 1 TABLET(5 MG) BY MOUTH DAILY WITH BREAKFAST 90 tablet 0  . glucose blood (ONE TOUCH ULTRA TEST) test strip Use as instructed 100 each 12  . Iron-Vit C-Vit B12-Folic Acid (IRON 619 PLUS PO) Take 65 mg by mouth.    . Lancets (ONETOUCH ULTRASOFT) lancets Use as instructed 100 each 12  . losartan-hydrochlorothiazide (HYZAAR) 100-25 MG tablet TAKE 1 TABLET BY MOUTH DAILY 30 tablet 0  . Multiple Vitamin (MULTIVITAMIN WITH MINERALS) TABS tablet Take 1 tablet by mouth daily.    Marland Kitchen omeprazole (PRILOSEC) 10 MG capsule Take this 30 minutes before breakfast and 30 minutes before the evening meal for 8 weeks 90 capsule 3  . PARoxetine (PAXIL) 40 MG tablet TAKE 1 TABLET BY MOUTH EVERY MORNING 30 tablet 0  . pramipexole (MIRAPEX) 1 MG tablet Take 1 tablet (  1 mg total) by mouth at bedtime. 90 tablet 1  . TURMERIC PO Take 1 tablet by mouth daily. Takes '300mg'$  daily    . valACYclovir (VALTREX) 1000 MG tablet Take 1,000 mg by mouth as needed (out break).      No current facility-administered medications on file prior to visit.    Past Medical History  Diagnosis Date  . Hypertension   . Restless leg syndrome   . Urinary incontinence   . Anxiety   . GERD (gastroesophageal reflux disease)   . High triglycerides   . Hyperlipidemia   . Depression   . Sleep apnea     mild; does not wear CPAP  . Urgency of urination     and frequency takes Vesicare  . Chronic bronchitis ([place])      "used to get it q yr; not as much since septal repair in 2004"  . Walking pneumonia ~ 2013  . Type II diabetes mellitus ([place])   . Left knee DJD   . Primary localized osteoarthritis of right knee   . Arthritis     "was in my knees before OR; now in my hands" (05/05/2014)  . HSV infection   . Bronchitis   . Obesity      Past Surgical History  Procedure Laterality Date  . Uvulopalatopharyngoplasty, tonsillectomy and septoplasty  2004  . Knee arthroscopy w/ meniscectomy Right 2010  . Tonsillectomy  ~ 2004  . Refractive surgery Bilateral ~ 2005  . Colonoscopy    . Total knee arthroplasty Left 08/11/2013    Procedure: LEFT TOTAL KNEE ARTHROPLASTY;  Surgeon: Lorn Junes, MD;  Location: [facility];  Service: Orthopedics;  Laterality: Left;  . Hysteroscopy  11/94    submuous myomas  . Total knee arthroplasty Right 05/04/2014    Procedure: RIGHT TOTAL KNEE ARTHROPLASTY;  Surgeon: Lorn Junes, MD;  Location: [place];  Service: Orthopedics;  Laterality: Right;  . Knee arthroscopy Right ~ 2010  . Vaginal hysterectomy  06/1993  . Nasal septum surgery      Review of Systems  Constitutional: Negative for fever and chills.  Eyes: Positive for visual disturbance.  Respiratory: Negative for chest tightness and shortness of breath.   Cardiovascular: Negative for chest pain, palpitations and leg swelling.  Gastrointestinal: Positive for nausea, vomiting and diarrhea.  Neurological: Negative for seizures and weakness.  Psychiatric/Behavioral: Positive for hallucinations.      Objective:    BP 102/70 mmHg  Pulse 85  Temp(Src) 98.3 F (36.8 C) (Oral)  Resp 16  Ht '5\' 4"'$  (1.626 m)  Wt 242 lb (109.77 kg)  BMI 41.52 kg/m2  SpO2 93% Nursing note and vital signs reviewed.  Physical Exam  Constitutional: She is oriented to person, place, and time. She appears well-developed and well-nourished. No distress.  Eyes: Conjunctivae and EOM are normal. Pupils are equal, round, and reactive to  light.  Cardiovascular: Normal rate, regular rhythm, normal heart sounds and intact distal pulses.   Pulmonary/Chest: Effort normal and breath sounds normal.  Neurological: She is alert and oriented to person, place, and time. She displays normal reflexes. No cranial nerve deficit. She exhibits normal muscle tone.  Skin: Skin is warm and dry.  Psychiatric: She has a normal mood and affect. Her behavior is normal. Judgment and thought content normal.       Assessment & Plan:   Problem List Items Addressed This Visit      Other   Occipital headache - Primary   Relevant  Medications   LORazepam (ATIVAN) 0.5 MG tablet   Other Relevant Orders   CT Head Wo Contrast   TSH   Visual disturbance of one eye    Visual disturbance in one eye with no significant findings by ophthalmology combined with occipital headache with concern for possible intracranial pathology. Obtain TSH. All other blood work reviewed in the emergency department is within normal limits with no evidence of infection. Neurological exam is intact today. Obtain CT of the head to rule out intracranial pathology. Has follow-up with neurology already scheduled. Follow-up for worst headache of her life or if symptoms worsen or do not improve.      Relevant Medications   LORazepam (ATIVAN) 0.5 MG tablet   Other Relevant Orders   CT Head Wo Contrast   TSH       I have changed Ms. Gergen's metFORMIN. I am also having her start on LORazepam. Additionally, I am having her maintain her multivitamin with minerals, valACYclovir, acyclovir cream, aspirin, TURMERIC PO, atorvastatin, PARoxetine, vitamin C, Iron-Vit C-Vit B12-Folic Acid (IRON 583 PLUS PO), onetouch ultrasoft, omeprazole, losartan-hydrochlorothiazide, glipiZIDE, pramipexole, glucose blood, ONE TOUCH ULTRA MINI, glipiZIDE, CARTIA XT, fenofibrate, and solifenacin.   Meds ordered this encounter  Medications  . metFORMIN (GLUCOPHAGE) 500 MG tablet    Sig: Take 1 tablet (500  mg total) by mouth 2 (two) times daily with a meal.    Dispense:  180 tablet    Refill:  1    Order Specific Question:  Supervising Provider    Answer:  Pricilla Holm A [[id number]]  . LORazepam (ATIVAN) 0.5 MG tablet    Sig: Take 1-3 tablets by mouth 30 minutes prior to imaging for anxiety    Dispense:  3 tablet    Refill:  0    Order Specific Question:  Supervising Provider    Answer:  Pricilla Holm A [[id number]]  . solifenacin (VESICARE) 10 MG tablet    Sig: Take 1 tablet (10 mg total) by mouth daily.    Dispense:  90 tablet    Refill:  1    Order Specific Question:  Supervising Provider    Answer:  Pricilla Holm A [[id number]]     Follow-up: Return in about 3 weeks (around 12/29/2015), or if symptoms worsen or fail to improve.  Mauricio Po, FNP

## 2015-12-14 ENCOUNTER — Ambulatory Visit
Admission: RE | Admit: 2015-12-14 | Discharge: 2015-12-14 | Disposition: A | Discharge status: Home / Self Care | Payer: 59 | Source: Ambulatory Visit | Attending: Family | Admitting: Family

## 2015-12-14 ENCOUNTER — Telehealth: Payer: Self-pay

## 2015-12-14 DIAGNOSIS — R51 Headache: Principal | ICD-10-CM

## 2015-12-14 DIAGNOSIS — R519 Headache, unspecified: Secondary | ICD-10-CM

## 2015-12-14 DIAGNOSIS — N3281 Overactive bladder: Secondary | ICD-10-CM

## 2015-12-14 DIAGNOSIS — H539 Unspecified visual disturbance: Secondary | ICD-10-CM

## 2015-12-14 MED ORDER — OXYBUTYNIN CHLORIDE ER 5 MG PO TB24: 5.0000 mg | ORAL_TABLET | Freq: Every day | ORAL | Status: DC

## 2015-12-14 NOTE — Telephone Encounter (Signed)
Pt advised via personal VM 

## 2015-12-14 NOTE — Telephone Encounter (Signed)
Notification received from pharmacy - pt must first try and fail Toviaz AND one of the following: Oxytrol OTC, oxybutynin (generic Ditropan), oxybutynin extended-release (generic Ditropan XL).  Please advise

## 2015-12-14 NOTE — Telephone Encounter (Signed)
Ditropan XL sent to pharmacy.

## 2015-12-22 ENCOUNTER — Encounter: Payer: Self-pay | Admitting: Family

## 2015-12-23 ENCOUNTER — Other Ambulatory Visit: Payer: Self-pay | Admitting: Family

## 2015-12-29 ENCOUNTER — Ambulatory Visit (INDEPENDENT_AMBULATORY_CARE_PROVIDER_SITE_OTHER): Payer: 59 | Admitting: Neurology

## 2015-12-29 ENCOUNTER — Encounter: Payer: Self-pay | Admitting: Neurology

## 2015-12-29 VITALS — BP 132/62 | HR 68 | Ht 64.0 in | Wt 246.0 lb

## 2015-12-29 DIAGNOSIS — G44209 Tension-type headache, unspecified, not intractable: Secondary | ICD-10-CM

## 2015-12-29 MED ORDER — PREDNISONE 10 MG PO TABS: ORAL_TABLET | ORAL | Status: DC

## 2015-12-29 NOTE — Progress Notes (Signed)
NEUROLOGY CONSULTATION NOTE  Diane Gregory MRN: [id number] DOB: 1956/03/25  Referring provider: Mauricio Po, FNP Primary care provider: Mauricio Po, FNP  Reason for consult:  headache  HISTORY OF PRESENT ILLNESS: Diane Gregory is a 60 year old right-handed woman with hypertension, restless leg syndrome, urinary incontinence, anxiety, GERD, hyperlipidemia, depression, type 2 diabetes, osteoarthritis and HSV who presents for headache.  History obtained by patient and PCP notes.  Imaging of CT head and CTA head and neck personally reviewed.  About 3 weeks ago, she developed a new headache.  It is a pressure-like headache, about 2-3/10, located between her ears and radiating down both sides of her neck.  At first, she noted floaters and was evaluated by ophthalmology, with no significant findings on exam.  There was some mild nausea but no photophobia or phonophobia.  It was constant for 3 or 4 days, so she went to the ED on 12/04/15.  CT/CTA of head and CTA of neck were unremarkable.   Headaches are now 1/10, last 3 hours and occur once a day.  She does not take any pain reliever.  She reports increased work-related stress.  She works for a [employer] and was switched to a new position in January.  She has had a difficult learning curve.  She reports that she had 2 migraines in college.  Her mother has history of headache and trigeminal neuralgia.  Current Antihypertensive medications:  Hyzaar, Cartia XT Current Antidepressant medications:  Paxil '40mg'$  Current Anticonvulsant medications:  no Current Vitamins/Herbal/Supplements:  turmeric Current Antihistamines/Decongestants:  no Other therapy:  no  Caffeine:  Cut down Alcohol:  no Smoker:  no Diet:  Does not keep hydrated. Exercise:  no Depression/stress:  As above Sleep hygiene:  poor  PAST MEDICAL HISTORY: Past Medical History  Diagnosis Date  . Hypertension   . Restless leg syndrome   . Urinary incontinence   . Anxiety     . GERD (gastroesophageal reflux disease)   . High triglycerides   . Hyperlipidemia   . Depression   . Sleep apnea     mild; does not wear CPAP  . Urgency of urination     and frequency takes Vesicare  . Chronic bronchitis ([place])     "used to get it q yr; not as much since septal repair in 2004"  . Walking pneumonia ~ 2013  . Type II diabetes mellitus ([place])   . Left knee DJD   . Primary localized osteoarthritis of right knee   . Arthritis     "was in my knees before OR; now in my hands" (05/05/2014)  . HSV infection   . Bronchitis   . Obesity     PAST SURGICAL HISTORY: Past Surgical History  Procedure Laterality Date  . Uvulopalatopharyngoplasty, tonsillectomy and septoplasty  2004  . Knee arthroscopy w/ meniscectomy Right 2010  . Tonsillectomy  ~ 2004  . Refractive surgery Bilateral ~ 2005  . Colonoscopy    . Total knee arthroplasty Left 08/11/2013    Procedure: LEFT TOTAL KNEE ARTHROPLASTY;  Surgeon: Lorn Junes, MD;  Location: [place];  Service: Orthopedics;  Laterality: Left;  . Hysteroscopy  11/94    submuous myomas  . Total knee arthroplasty Right 05/04/2014    Procedure: RIGHT TOTAL KNEE ARTHROPLASTY;  Surgeon: Lorn Junes, MD;  Location: [place];  Service: Orthopedics;  Laterality: Right;  . Knee arthroscopy Right ~ 2010  . Vaginal hysterectomy  06/1993  . Nasal septum surgery  MEDICATIONS: Current Outpatient Prescriptions on File Prior to Visit  Medication Sig Dispense Refill  . acyclovir cream (ZOVIRAX) 5 % Apply 1 application topically every 3 (three) hours as needed (out break).    . Ascorbic Acid (VITAMIN C) 1000 MG tablet Take 1,000 mg by mouth daily.    Marland Kitchen aspirin 81 MG tablet Take 81 mg by mouth daily.    Marland Kitchen atorvastatin (LIPITOR) 10 MG tablet TAKE 1 TABLET BY MOUTH DAILY 30 tablet 0  . Blood Glucose Monitoring Suppl (ONE TOUCH ULTRA MINI) w/Device KIT Use glucose meter 1-4 times daily as instructed to check blood sugar. 1 each 0  . CARTIA XT 240 MG  24 hr capsule TAKE 1 CAPSULE BY MOUTH ONCE DAILY 30 capsule 0  . fenofibrate 160 MG tablet TAKE 1 TABLET(160 MG) BY MOUTH DAILY 30 tablet 0  . glipiZIDE (GLUCOTROL XL) 2.5 MG 24 hr tablet Take 1 tablet (2.5 mg total) by mouth daily with breakfast. 90 tablet 0  . glipiZIDE (GLUCOTROL XL) 5 MG 24 hr tablet TAKE 1 TABLET(5 MG) BY MOUTH DAILY WITH BREAKFAST 90 tablet 0  . glucose blood (ONE TOUCH ULTRA TEST) test strip Use as instructed 100 each 12  . Iron-Vit C-Vit B12-Folic Acid (IRON 664 PLUS PO) Take 65 mg by mouth.    . Lancets (ONETOUCH ULTRASOFT) lancets Use as instructed 100 each 12  . LORazepam (ATIVAN) 0.5 MG tablet Take 1-3 tablets by mouth 30 minutes prior to imaging for anxiety 3 tablet 0  . losartan-hydrochlorothiazide (HYZAAR) 100-25 MG tablet TAKE 1 TABLET BY MOUTH DAILY 30 tablet 0  . metFORMIN (GLUCOPHAGE) 500 MG tablet Take 1 tablet (500 mg total) by mouth 2 (two) times daily with a meal. 180 tablet 1  . Multiple Vitamin (MULTIVITAMIN WITH MINERALS) TABS tablet Take 1 tablet by mouth daily.    Marland Kitchen omeprazole (PRILOSEC) 10 MG capsule Take this 30 minutes before breakfast and 30 minutes before the evening meal for 8 weeks 90 capsule 3  . oxybutynin (DITROPAN-XL) 5 MG 24 hr tablet Take 1 tablet (5 mg total) by mouth at bedtime. 30 tablet 0  . PARoxetine (PAXIL) 40 MG tablet TAKE 1 TABLET BY MOUTH EVERY MORNING 30 tablet 0  . pramipexole (MIRAPEX) 1 MG tablet Take 1 tablet (1 mg total) by mouth at bedtime. 90 tablet 1  . solifenacin (VESICARE) 10 MG tablet Take 1 tablet (10 mg total) by mouth daily. 90 tablet 1  . TURMERIC PO Take 1 tablet by mouth daily. Takes '300mg'$  daily    . valACYclovir (VALTREX) 1000 MG tablet Take 1,000 mg by mouth as needed (out break).      No current facility-administered medications on file prior to visit.    ALLERGIES: Allergies  Allergen Reactions  . Celebrex [Celecoxib] Swelling  . Lidocaine Other (See Comments)    "bad taste in mouth" with dental  work  . Lisinopril Cough  . Meloxicam Swelling  . Nsaids Swelling  . Penicillins Other (See Comments)    Drop in blood pressure    FAMILY HISTORY: Family History  Problem Relation Age of Onset  . Heart disease Mother   . Hypertension Mother   . Stroke Mother   . Breast cancer Mother   . Hypertension Brother   . Diabetes Mellitus II Father   . Cancer Father     CLL  . Breast cancer Paternal Grandmother   . Breast cancer Maternal Grandmother     SOCIAL HISTORY: Social History   Social  History  . Marital Status: Single    Spouse Name: N/A  . Number of Children: 2  . Years of Education: 16   Occupational History  . Intake Coordinator    Social History Main Topics  . Smoking status: Never Smoker   . Smokeless tobacco: Never Used  . Alcohol Use: 0.6 oz/week    1 Standard drinks or equivalent per week     Comment: 05/05/2014 "might have a drink once/month"  . Drug Use: No  . Sexual Activity:    Partners: Male    Birth Control/ Protection: Surgical     Comment: [facility] 1994   Other Topics Concern  . Not on file   Social History Narrative   Currently single. Fun: Crafts, flea markets, antiquing.    Denies religious beliefs that effect health care.     REVIEW OF SYSTEMS: Constitutional: No fevers, chills, or sweats, no generalized fatigue, change in appetite Eyes: No visual changes, double vision, eye pain Ear, nose and throat: No hearing loss, ear pain, nasal congestion, sore throat Cardiovascular: No chest pain, palpitations Respiratory:  No shortness of breath at rest or with exertion, wheezes GastrointestinaI: No nausea, vomiting, diarrhea, abdominal pain, fecal incontinence Genitourinary:  No dysuria, urinary retention or frequency Musculoskeletal:  No neck pain, back pain Integumentary: No rash, pruritus, skin lesions Neurological: as above Psychiatric: No depression, insomnia, anxiety Endocrine: No palpitations, fatigue, diaphoresis, mood swings, change in  appetite, change in weight, increased thirst Hematologic/Lymphatic:  No purpura, petechiae. Allergic/Immunologic: no itchy/runny eyes, nasal congestion, recent allergic reactions, rashes  PHYSICAL EXAM: Filed Vitals:   12/29/15 0807  BP: 132/62  Pulse: 68   General: No acute distress.  Patient appears well-groomed.  Head:  Normocephalic/atraumatic Eyes:  fundi examined but not visualized Neck: supple, no paraspinal tenderness, full range of motion Back: No paraspinal tenderness Heart: regular rate and rhythm Lungs: Clear to auscultation bilaterally. Vascular: No carotid bruits. Neurological Exam: Mental status: alert and oriented to person, place, and time, recent and remote memory intact, fund of knowledge intact, attention and concentration intact, speech fluent and not dysarthric, language intact. Cranial nerves: CN I: not tested CN II: pupils equal, round and reactive to light, visual fields intact CN III, IV, VI:  full range of motion, no nystagmus, no ptosis CN V: facial sensation intact CN VII: upper and lower face symmetric CN VIII: hearing intact CN IX, X: gag intact, uvula midline CN XI: sternocleidomastoid and trapezius muscles intact CN XII: tongue midline Bulk & Tone: normal, no fasciculations. Motor:  5/5 throughout  Sensation:  temperature and vibration sensation intact. Deep Tendon Reflexes:  2+ throughout, toes downgoing.  Finger to nose testing:  Without dysmetria.  Heel to shin:  Without dysmetria.  Gait:  Normal station and stride.  Able to turn and tandem walk. Romberg negative.  IMPRESSION: Probable tension-type headache Morbid obesity  PLAN: 1.  Will prescribe prednisone taper.  If not effective, will try starting tizanidine 2.  Lifestyle modification:  Diet, exercise, hydration, weight loss, stress reduction 3.  Follow up in 3 to 4 months.  Thank you for allowing me to take part in the care of this patient.  Metta Clines, DO  CC:  Mauricio Po, FNP

## 2015-12-29 NOTE — Patient Instructions (Signed)
I think you have tension headache 1.  We will prescribe you a prednisone taper to break these daily headaches.  Take 6tabs x1day, then 5tabs x1day, then 4tabs x1day, then 3tabs x1day, then 2tabs x1day, then 1tab x1day, then STOP 2.  If headaches not improved, contact me and we can start a muscle relaxant. 3.  After finishing the prednisone taper, may take over the counter medication (tylenol, ibuprofen, Excedrin), but limit to no more than 2 days out of the week to prevent rebound headache 4.  Stress reduction 5.  Routine exercise 6.  Increase water intake, diet.  I recommend Mediterranean diet    Why follow it? Research shows. . Those who follow the Mediterranean diet have a reduced risk of heart disease  . The diet is associated with a reduced incidence of Parkinson's and Alzheimer's diseases . People following the diet may have longer life expectancies and lower rates of chronic diseases  . The Dietary Guidelines for Americans recommends the Mediterranean diet as an eating plan to promote health and prevent disease  What Is the Mediterranean Diet?  . Healthy eating plan based on typical foods and recipes of Mediterranean-style cooking . The diet is primarily a plant based diet; these foods should make up a majority of meals   Starches - Plant based foods should make up a majority of meals - They are an important sources of vitamins, minerals, energy, antioxidants, and fiber - Choose whole grains, foods high in fiber and minimally processed items  - Typical grain sources include wheat, oats, barley, corn, brown rice, bulgar, farro, millet, polenta, couscous  - Various types of beans include chickpeas, lentils, fava beans, black beans, white beans   Fruits  Veggies - Large quantities of antioxidant rich fruits & veggies; 6 or more servings  - Vegetables can be eaten raw or lightly drizzled with oil and cooked  - Vegetables common to the traditional Mediterranean Diet include: artichokes,  arugula, beets, broccoli, brussel sprouts, cabbage, carrots, celery, collard greens, cucumbers, eggplant, kale, leeks, lemons, lettuce, mushrooms, okra, onions, peas, peppers, potatoes, pumpkin, radishes, rutabaga, shallots, spinach, sweet potatoes, turnips, zucchini - Fruits common to the Mediterranean Diet include: apples, apricots, avocados, cherries, clementines, dates, figs, grapefruits, grapes, melons, nectarines, oranges, peaches, pears, pomegranates, strawberries, tangerines  Fats - Replace butter and margarine with healthy oils, such as olive oil, canola oil, and tahini  - Limit nuts to no more than a handful a day  - Nuts include walnuts, almonds, pecans, pistachios, pine nuts  - Limit or avoid candied, honey roasted or heavily salted nuts - Olives are central to the [employer] - can be eaten whole or used in a variety of dishes   Meats Protein - Limiting red meat: no more than a few times a month - When eating red meat: choose lean cuts and keep the portion to the size of deck of cards - Eggs: approx. 0 to 4 times a week  - Fish and lean poultry: at least 2 a week  - Healthy protein sources include, chicken, [place], lean beef, lamb - Increase intake of seafood such as tuna, salmon, trout, mackerel, shrimp, scallops - Avoid or limit high fat processed meats such as sausage and bacon  Dairy - Include moderate amounts of low fat dairy products  - Focus on healthy dairy such as fat free yogurt, skim milk, low or reduced fat cheese - Limit dairy products higher in fat such as whole or 2% milk, cheese, ice cream  Alcohol - Moderate amounts of red wine is ok  - No more than 5 oz daily for women (all ages) and men older than age 5  - No more than 10 oz of wine daily for men younger than 15  Other - Limit sweets and other desserts  - Use herbs and spices instead of salt to flavor foods  - Herbs and spices common to the traditional Mediterranean Diet include: basil, bay leaves,  chives, cloves, cumin, fennel, garlic, lavender, marjoram, mint, oregano, parsley, pepper, rosemary, sage, savory, sumac, tarragon, thyme   It's not just a diet, it's a lifestyle:  . The Mediterranean diet includes lifestyle factors typical of those in the region  . Foods, drinks and meals are best eaten with others and savored . Daily physical activity is important for overall good health . This could be strenuous exercise like running and aerobics . This could also be more leisurely activities such as walking, housework, yard-work, or taking the stairs . Moderation is the key; a balanced and healthy diet accommodates most foods and drinks . Consider portion sizes and frequency of consumption of certain foods   Meal Ideas & Options:  . Breakfast:  o Whole wheat toast or whole wheat English muffins with peanut butter & hard boiled egg o Steel cut oats topped with apples & cinnamon and skim milk  o Fresh fruit: banana, strawberries, melon, berries, peaches  o Smoothies: strawberries, bananas, greek yogurt, peanut butter o Low fat greek yogurt with blueberries and granola  o Egg white omelet with spinach and mushrooms o Breakfast couscous: whole wheat couscous, apricots, skim milk, cranberries  . Sandwiches:  o Hummus and grilled vegetables (peppers, zucchini, squash) on whole wheat bread   o Grilled chicken on whole wheat pita with lettuce, tomatoes, cucumbers or tzatziki  o Tuna salad on whole wheat bread: tuna salad made with greek yogurt, olives, red peppers, capers, green onions o Garlic rosemary lamb pita: lamb sauted with garlic, rosemary, salt & pepper; add lettuce, cucumber, greek yogurt to pita - flavor with lemon juice and black pepper  . Seafood:  o Mediterranean grilled salmon, seasoned with garlic, basil, parsley, lemon juice and black pepper o Shrimp, lemon, and spinach whole-grain pasta salad made with low fat greek yogurt  o Seared scallops with lemon orzo  o Seared tuna  steaks seasoned salt, pepper, coriander topped with tomato mixture of olives, tomatoes, olive oil, minced garlic, parsley, green onions and cappers  . Meats:  o Herbed greek chicken salad with kalamata olives, cucumber, feta  o Red bell peppers stuffed with spinach, bulgur, lean ground beef (or lentils) & topped with feta   o Kebabs: skewers of chicken, tomatoes, onions, zucchini, squash  o [place] burgers: made with red onions, mint, dill, lemon juice, feta cheese topped with roasted red peppers . Vegetarian o Cucumber salad: cucumbers, artichoke hearts, celery, red onion, feta cheese, tossed in olive oil & lemon juice  o Hummus and whole grain pita points with a greek salad (lettuce, tomato, feta, olives, cucumbers, red onion) o Lentil soup with celery, carrots made with vegetable broth, garlic, salt and pepper  o Tabouli salad: parsley, bulgur, mint, scallions, cucumbers, tomato, radishes, lemon juice, olive oil, salt and pepper. 7.  Follow up in 3 to 4 months.

## 2015-12-31 ENCOUNTER — Other Ambulatory Visit: Payer: Self-pay | Admitting: Family

## 2016-01-10 ENCOUNTER — Other Ambulatory Visit: Payer: Self-pay | Admitting: Family

## 2016-01-18 ENCOUNTER — Other Ambulatory Visit: Payer: Self-pay | Admitting: Family

## 2016-01-24 ENCOUNTER — Encounter: Payer: Self-pay | Admitting: Family

## 2016-01-24 ENCOUNTER — Ambulatory Visit (INDEPENDENT_AMBULATORY_CARE_PROVIDER_SITE_OTHER): Payer: 59 | Admitting: Family

## 2016-01-24 VITALS — BP 110/62 | HR 70 | Temp 98.0°F | Resp 16 | Ht 64.0 in | Wt 246.0 lb

## 2016-01-24 DIAGNOSIS — N3281 Overactive bladder: Secondary | ICD-10-CM | POA: Diagnosis not present

## 2016-01-24 DIAGNOSIS — G2581 Restless legs syndrome: Secondary | ICD-10-CM

## 2016-01-24 MED ORDER — FESOTERODINE FUMARATE ER 4 MG PO TB24: 4.0000 mg | ORAL_TABLET | Freq: Every day | ORAL | Status: DC

## 2016-01-24 MED ORDER — PRAMIPEXOLE DIHYDROCHLORIDE 0.25 MG PO TABS: 0.2500 mg | ORAL_TABLET | Freq: Every day | ORAL | Status: DC

## 2016-01-24 NOTE — Assessment & Plan Note (Signed)
Continues to experience restless leg symptoms that are uncontrolled with the current dosage of Mirapex. Will cautiously increase the Mirapex to 1.25 mg as patient has previously taken this dose without adverse side effects. Discussed risks associated with continued dopamine agonist. May consider addition of gabapentin if unsuccessful.

## 2016-01-24 NOTE — Assessment & Plan Note (Signed)
Since stopping the Vesicare patient has increased symptoms of overactive bladder with the oxybutinin. Discontinue oxybutinin. Start Toviaz. Follow up if symptoms do not improve.

## 2016-01-24 NOTE — Progress Notes (Signed)
Subjective:    Patient ID: Kandice Robinsons, female    DOB: 10/10/1955, 60 y.o.   MRN: [id number]  Chief Complaint  Patient presents with  . medication follow up    mirapex stopped working and wants the dose increased and oxybutynin is not working at all    HPI:  Dalanie P Couillard is a 60 y.o. female who  has a past medical history of Hypertension; Restless leg syndrome; Urinary incontinence; Anxiety; GERD (gastroesophageal reflux disease); High triglycerides; Hyperlipidemia; Depression; Sleep apnea; Urgency of urination; Chronic bronchitis ([place]); Walking pneumonia (~ 2013); Type II diabetes mellitus ([place]); Left knee DJD; Primary localized osteoarthritis of right knee; Arthritis; HSV infection; Bronchitis; and Obesity. and presents today for a follow up office visit.  1.) Restless leg syndrome - Currently maintained on Mirapex. Reports taking the medication as prescribed without adverse side effects.   2.) Overactive bladder - Currently maintained on oxybutinin secondary to insurance coverage. Reports taking the medication as prescribed and denies adverse side effects. Endorses that the medication does not help signigficantly with her symptoms like the Vesicare did. Continues to experience urgency and frequency with no dysuria.   Allergies  Allergen Reactions  . Celebrex [Celecoxib] Swelling  . Lidocaine Other (See Comments)    "bad taste in mouth" with dental work  . Lisinopril Cough  . Meloxicam Swelling  . Nsaids Swelling  . Penicillins Other (See Comments)    Drop in blood pressure     Current Outpatient Prescriptions on File Prior to Visit  Medication Sig Dispense Refill  . acyclovir cream (ZOVIRAX) 5 % Apply 1 application topically every 3 (three) hours as needed (out break).    . Ascorbic Acid (VITAMIN C) 1000 MG tablet Take 1,000 mg by mouth daily.    Marland Kitchen aspirin 81 MG tablet Take 81 mg by mouth daily.    Marland Kitchen atorvastatin (LIPITOR) 10 MG tablet TAKE 1 TABLET BY MOUTH DAILY 30 tablet  0  . Blood Glucose Monitoring Suppl (ONE TOUCH ULTRA MINI) w/Device KIT Use glucose meter 1-4 times daily as instructed to check blood sugar. 1 each 0  . CARTIA XT 240 MG 24 hr capsule TAKE 1 CAPSULE BY MOUTH ONCE DAILY 30 capsule 0  . fenofibrate 160 MG tablet TAKE 1 TABLET(160 MG) BY MOUTH DAILY 30 tablet 0  . glipiZIDE (GLUCOTROL XL) 2.5 MG 24 hr tablet Take 1 tablet (2.5 mg total) by mouth daily with breakfast. 90 tablet 0  . GLIPIZIDE XL 5 MG 24 hr tablet TAKE 1 TABLET(5 MG) BY MOUTH DAILY WITH BREAKFAST 90 tablet 1  . glucose blood (ONE TOUCH ULTRA TEST) test strip Use as instructed 100 each 12  . Iron-Vit C-Vit B12-Folic Acid (IRON 505 PLUS PO) Take 65 mg by mouth.    . Lancets (ONETOUCH ULTRASOFT) lancets Use as instructed 100 each 12  . LORazepam (ATIVAN) 0.5 MG tablet TAKE 1-3 TABLETS BY MOUTH 30 MINUTES PRIOR TO IMAGING FOR ANXIETY 3 tablet 0  . losartan-hydrochlorothiazide (HYZAAR) 100-25 MG tablet TAKE 1 TABLET BY MOUTH DAILY 30 tablet 0  . metFORMIN (GLUCOPHAGE) 500 MG tablet Take 1 tablet (500 mg total) by mouth 2 (two) times daily with a meal. 180 tablet 1  . Multiple Vitamin (MULTIVITAMIN WITH MINERALS) TABS tablet Take 1 tablet by mouth daily.    Marland Kitchen omeprazole (PRILOSEC) 10 MG capsule Take this 30 minutes before breakfast and 30 minutes before the evening meal for 8 weeks 90 capsule 3  . oxybutynin (DITROPAN-XL) 5 MG  24 hr tablet TAKE 1 TABLET(5 MG) BY MOUTH AT BEDTIME 30 tablet 0  . PARoxetine (PAXIL) 40 MG tablet TAKE 1 TABLET BY MOUTH EVERY MORNING 30 tablet 0  . pramipexole (MIRAPEX) 1 MG tablet Take 1 tablet (1 mg total) by mouth at bedtime. 90 tablet 1  . TURMERIC PO Take 1 tablet by mouth daily. Takes 358m daily    . valACYclovir (VALTREX) 1000 MG tablet Take 1,000 mg by mouth as needed (out break).      No current facility-administered medications on file prior to visit.     Past Surgical History  Procedure Laterality Date  . Uvulopalatopharyngoplasty,  tonsillectomy and septoplasty  2004  . Knee arthroscopy w/ meniscectomy Right 2010  . Tonsillectomy  ~ 2004  . Refractive surgery Bilateral ~ 2005  . Colonoscopy    . Total knee arthroplasty Left 08/11/2013    Procedure: LEFT TOTAL KNEE ARTHROPLASTY;  Surgeon: RLorn Junes MD;  [facility]  Service: Orthopedics;  Laterality: Left;  . Hysteroscopy  11/94    submuous myomas  . Total knee arthroplasty Right 05/04/2014    Procedure: RIGHT TOTAL KNEE ARTHROPLASTY;  Surgeon: RLorn Junes MD;  [facility] [facility]  Service: Orthopedics;  Laterality: Right;  . Knee arthroscopy Right ~ 2010  . Vaginal hysterectomy  06/1993  . Nasal septum surgery        Review of Systems  Constitutional: Negative for fever and chills.  Genitourinary: Positive for urgency and frequency.  Neurological: Negative for tremors and weakness.       Positive for overactive bladder.       Objective:    BP 110/62 mmHg  Pulse 70  Temp(Src) 98 F (36.7 C) (Oral)  Resp 16  Ht _0  (1.626 m)  Wt 246 lb (111.585 kg)  BMI 42.21 kg/m2  SpO2 95% Nursing note and vital signs reviewed.  Physical Exam  Constitutional: She is oriented to person, place, and time. She appears well-developed and well-nourished. No distress.  Cardiovascular: Normal rate, regular rhythm, normal heart sounds and intact distal pulses.   Pulmonary/Chest: Effort normal and breath sounds normal.  Neurological: She is alert and oriented to person, place, and time.  Skin: Skin is warm and dry.  Psychiatric: She has a normal mood and affect. Her behavior is normal. Judgment and thought content normal.       Assessment & Plan:   Problem List Items Addressed This Visit      Genitourinary   Overactive bladder    Since stopping the Vesicare patient has increased symptoms of overactive bladder with the oxybutinin. Discontinue oxybutinin. Start Toviaz. Follow up if symptoms do not improve.       Relevant Medications   fesoterodine  (TOVIAZ) 4 MG TB24 tablet     Other   Restless leg syndrome - Primary    Continues to experience restless leg symptoms that are uncontrolled with the current dosage of Mirapex. Will cautiously increase the Mirapex to 1.25 mg as patient has previously taken this dose without adverse side effects. Discussed risks associated with continued dopamine agonist. May consider addition of gabapentin if unsuccessful.       Relevant Medications   pramipexole (MIRAPEX) 0.25 MG tablet       I have discontinued Ms. Orren's solifenacin and predniSONE. I am also having her start on fesoterodine and pramipexole. Additionally, I am having her maintain her multivitamin with minerals, valACYclovir, acyclovir cream, aspirin, TURMERIC PO, atorvastatin, PARoxetine, vitamin C, Iron-Vit C-Vit B12-Folic  Acid (IRON 100 PLUS PO), onetouch ultrasoft, omeprazole, losartan-hydrochlorothiazide, pramipexole, glucose blood, ONE TOUCH ULTRA MINI, glipiZIDE, fenofibrate, metFORMIN, GLIPIZIDE XL, LORazepam, CARTIA XT, and oxybutynin.   Meds ordered this encounter  Medications  . fesoterodine (TOVIAZ) 4 MG TB24 tablet    Sig: Take 1 tablet (4 mg total) by mouth daily.    Dispense:  30 tablet    Refill:  0    Order Specific Question:  Supervising Provider    Answer:  Pricilla Holm A [[id number]]  . pramipexole (MIRAPEX) 0.25 MG tablet    Sig: Take 1 tablet (0.25 mg total) by mouth daily.    Dispense:  30 tablet    Refill:  2    Order Specific Question:  Supervising Provider    Answer:  Pricilla Holm A [[id number]]     Follow-up: Return in about 1 month (around 02/23/2016), or if symptoms worsen or fail to improve.  Mauricio Po, FNP

## 2016-01-24 NOTE — Patient Instructions (Signed)
Thank you for choosing [facility].  Summary/Instructions:  Please STOP taking the Oxybutinin.   START taking the Toviaz.   Increase the Mirapex.  Your prescription(s) have been submitted to your pharmacy or been printed and provided for you. Please take as directed and contact our office if you believe you are having problem(s) with the medication(s) or have any questions.  If your symptoms worsen or fail to improve, please contact our office for further instruction, or in case of emergency go directly to the emergency room at the closest medical facility.

## 2016-01-24 NOTE — Progress Notes (Signed)
Pre visit review using our clinic review tool, if applicable. No additional management support is needed unless otherwise documented below in the visit note. 

## 2016-01-25 ENCOUNTER — Other Ambulatory Visit: Payer: Self-pay | Admitting: Family

## 2016-02-13 ENCOUNTER — Other Ambulatory Visit: Payer: Self-pay | Admitting: Family

## 2016-02-22 ENCOUNTER — Ambulatory Visit: Payer: Self-pay | Admitting: Family

## 2016-03-01 ENCOUNTER — Ambulatory Visit: Payer: 59 | Admitting: Neurology

## 2016-03-01 ENCOUNTER — Ambulatory Visit (INDEPENDENT_AMBULATORY_CARE_PROVIDER_SITE_OTHER): Payer: 59 | Admitting: Family

## 2016-03-01 ENCOUNTER — Encounter: Payer: Self-pay | Admitting: Family

## 2016-03-01 VITALS — BP 140/80 | HR 87 | Temp 98.0°F | Resp 20 | Wt 249.0 lb

## 2016-03-01 DIAGNOSIS — N3281 Overactive bladder: Secondary | ICD-10-CM

## 2016-03-01 DIAGNOSIS — M654 Radial styloid tenosynovitis [de Quervain]: Secondary | ICD-10-CM | POA: Insufficient documentation

## 2016-03-01 DIAGNOSIS — G2581 Restless legs syndrome: Secondary | ICD-10-CM | POA: Diagnosis not present

## 2016-03-01 MED ORDER — DICLOFENAC SODIUM 2 % TD SOLN: 1.0000 "application " | Freq: Two times a day (BID) | TRANSDERMAL | Status: DC | PRN

## 2016-03-01 MED ORDER — FESOTERODINE FUMARATE ER 4 MG PO TB24: ORAL_TABLET | ORAL | Status: DC

## 2016-03-01 NOTE — Progress Notes (Signed)
Subjective:    Patient ID: Kandice Robinsons, female    DOB: [birth year], 60 y.o.   MRN: [id number]  Chief Complaint  Patient presents with  . Follow-up  . Wrist Pain    HPI:  Lenora P Patchen is a 60 y.o. female who  has a past medical history of Hypertension; Restless leg syndrome; Urinary incontinence; Anxiety; GERD (gastroesophageal reflux disease); High triglycerides; Hyperlipidemia; Depression; Sleep apnea; Urgency of urination; Chronic bronchitis ([place]); Walking pneumonia (~ 2013); Type II diabetes mellitus ([place]); Left knee DJD; Primary localized osteoarthritis of right knee; Arthritis; HSV infection; Bronchitis; and Obesity. and presents today for a follow up office visit.   1.)  Wrist pain - This is a new problem. Associated symptom of pain located on the dorsal aspect of right wrist has been going on for several weeks. Describes that she was fishing and continued to use her wrist and questions if she did anything during that. Aggravated with cutting flowers. Denies other trauma or sounds/sensations heard or felt. Grip strength is decreased. Described as achy. Modifying factors include ice and a neoprene wrist brace. Course of the symptoms has worsened since initial onset. She does work and type a lot.    2.) Restless leg syndrome  - This is a chronic problem.Recently increased Mirapex to 1.25 mg nightly. Reports taking medication as prescribed and denies adverse side effects. Notes her symptoms are significantly improved with decrease dosage.  3.) Overactive bladder - This is a chronic problem. Recently changed to [place] and reports that her symptoms have significantly improved and able to sleep through the night. Takes the medications as prescribed and denies adverse side effects other than mild dry mouth.    Allergies  Allergen Reactions  . Celebrex [Celecoxib] Swelling  . Lidocaine Other (See Comments)    "bad taste in mouth" with dental work  . Lisinopril Cough  . Meloxicam Swelling    . Nsaids Swelling  . Penicillins Other (See Comments)    Drop in blood pressure     Current Outpatient Prescriptions on File Prior to Visit  Medication Sig Dispense Refill  . acyclovir cream (ZOVIRAX) 5 % Apply 1 application topically every 3 (three) hours as needed (out break).    . Ascorbic Acid (VITAMIN C) 1000 MG tablet Take 1,000 mg by mouth daily.    Marland Kitchen aspirin 81 MG tablet Take 81 mg by mouth daily.    Marland Kitchen atorvastatin (LIPITOR) 10 MG tablet TAKE 1 TABLET BY MOUTH DAILY 30 tablet 0  . Blood Glucose Monitoring Suppl (ONE TOUCH ULTRA MINI) w/Device KIT Use glucose meter 1-4 times daily as instructed to check blood sugar. 1 each 0  . CARTIA XT 240 MG 24 hr capsule TAKE 1 CAPSULE BY MOUTH ONCE DAILY 30 capsule 0  . fenofibrate 160 MG tablet TAKE 1 TABLET(160 MG) BY MOUTH DAILY 30 tablet 0  . glipiZIDE (GLUCOTROL XL) 2.5 MG 24 hr tablet TAKE 1 TABLET(2.5 MG) BY MOUTH DAILY WITH BREAKFAST 90 tablet 1  . GLIPIZIDE XL 5 MG 24 hr tablet TAKE 1 TABLET(5 MG) BY MOUTH DAILY WITH BREAKFAST 90 tablet 1  . glucose blood (ONE TOUCH ULTRA TEST) test strip Use as instructed 100 each 12  . Iron-Vit C-Vit B12-Folic Acid (IRON 203 PLUS PO) Take 65 mg by mouth.    . Lancets (ONETOUCH ULTRASOFT) lancets Use as instructed 100 each 12  . LORazepam (ATIVAN) 0.5 MG tablet TAKE 1-3 TABLETS BY MOUTH 30 MINUTES PRIOR TO IMAGING FOR ANXIETY 3  tablet 0  . losartan-hydrochlorothiazide (HYZAAR) 100-25 MG tablet TAKE 1 TABLET BY MOUTH DAILY 30 tablet 0  . metFORMIN (GLUCOPHAGE) 500 MG tablet Take 1 tablet (500 mg total) by mouth 2 (two) times daily with a meal. 180 tablet 1  . Multiple Vitamin (MULTIVITAMIN WITH MINERALS) TABS tablet Take 1 tablet by mouth daily.    Marland Kitchen omeprazole (PRILOSEC) 10 MG capsule Take this 30 minutes before breakfast and 30 minutes before the evening meal for 8 weeks 90 capsule 3  . oxybutynin (DITROPAN-XL) 5 MG 24 hr tablet TAKE 1 TABLET(5 MG) BY MOUTH AT BEDTIME 30 tablet 0  . PARoxetine  (PAXIL) 40 MG tablet TAKE 1 TABLET BY MOUTH EVERY MORNING 30 tablet 0  . pramipexole (MIRAPEX) 0.25 MG tablet Take 1 tablet (0.25 mg total) by mouth daily. 30 tablet 2  . pramipexole (MIRAPEX) 1 MG tablet Take 1 tablet (1 mg total) by mouth at bedtime. 90 tablet 1  . TURMERIC PO Take 1 tablet by mouth daily. Takes '300mg'$  daily    . valACYclovir (VALTREX) 1000 MG tablet Take 1,000 mg by mouth as needed (out break).      No current facility-administered medications on file prior to visit.     Past Surgical History  Procedure Laterality Date  . Uvulopalatopharyngoplasty, tonsillectomy and septoplasty  2004  . Knee arthroscopy w/ meniscectomy Right 2010  . Tonsillectomy  ~ 2004  . Refractive surgery Bilateral ~ 2005  . Colonoscopy    . Total knee arthroplasty Left 08/11/2013    Procedure: LEFT TOTAL KNEE ARTHROPLASTY;  Surgeon: Lorn Junes, MD;  Location: [facility];  Service: Orthopedics;  Laterality: Left;  . Hysteroscopy  11/94    submuous myomas  . Total knee arthroplasty Right 05/04/2014    Procedure: RIGHT TOTAL KNEE ARTHROPLASTY;  Surgeon: Lorn Junes, MD;  Location: [place] [facility];  Service: Orthopedics;  Laterality: Right;  . Knee arthroscopy Right ~ 2010  . Vaginal hysterectomy  06/1993  . Nasal septum surgery      Past Medical History  Diagnosis Date  . Hypertension   . Restless leg syndrome   . Urinary incontinence   . Anxiety   . GERD (gastroesophageal reflux disease)   . High triglycerides   . Hyperlipidemia   . Depression   . Sleep apnea     mild; does not wear CPAP  . Urgency of urination     and frequency takes Vesicare  . Chronic bronchitis ([place])     "used to get it q yr; not as much since septal repair in 2004"  . Walking pneumonia ~ 2013  . Type II diabetes mellitus ([place])   . Left knee DJD   . Primary localized osteoarthritis of right knee   . Arthritis     "was in my knees before OR; now in my hands" (05/05/2014)  . HSV infection   . Bronchitis   . Obesity      Review of Systems  Constitutional: Negative for fever and chills.  Respiratory: Negative for chest tightness and shortness of breath.   Cardiovascular: Negative for chest pain, palpitations and leg swelling.  Genitourinary: Negative for urgency, frequency and hematuria.  Musculoskeletal:       Positive for wrist/thumb pain  Neurological: Positive for weakness and numbness.      Objective:    BP 140/80 mmHg  Pulse 87  Temp(Src) 98 F (36.7 C) (Oral)  Resp 20  Wt 249 lb (112.946 kg)  SpO2 91% Nursing note  and vital signs reviewed.  Physical Exam  Constitutional: She is oriented to person, place, and time. She appears well-developed and well-nourished. No distress.  Cardiovascular: Normal rate, regular rhythm, normal heart sounds and intact distal pulses.   Pulmonary/Chest: Effort normal and breath sounds normal.  Musculoskeletal:  Right wrist/thumb - no obvious deformity or discoloration with mild/moderate edema and generalized tenderness along the extensor pollicis longus. No other tenderness able to be elicited. Range of motion within normal limits with discomfort noted with thumb flexion. Finkelstein's test is positive. Capillary refill is intact and appropriate.  Neurological: She is alert and oriented to person, place, and time.  Skin: Skin is warm and dry.  Psychiatric: She has a normal mood and affect. Her behavior is normal. Judgment and thought content normal.       Assessment & Plan:   Problem List Items Addressed This Visit      Musculoskeletal and Integument   De Quervain's tenosynovitis, right - Primary    Symptoms and exam consistent with take remains to no synovitis of the right thumb. Treat conservatively with ice and thumb spica splint for the next 3 weeks minimizing use as able. Start Pennsaid. Patient noted improvement upon placement of the thumb spica splint. Follow-up in 3 weeks or sooner if symptoms worsen or do not improve.      Relevant  Medications   Diclofenac Sodium (PENNSAID) 2 % SOLN     Genitourinary   Overactive bladder    Overactive bladder stable with improved symptoms and able to sleep through the night with current dosage of Toviaz. Mild xerostomia. Continue current dosage of Toviaz.      Relevant Medications   fesoterodine (TOVIAZ) 4 MG TB24 tablet     Other   Restless leg syndrome    Restless leg syndrome improved with increased dosage of Mirapex. No adverse side effects noted. Continue current dosage of Mirapex.          I have changed Ms. Tutterow's TOVIAZ to fesoterodine. I am also having her start on Diclofenac Sodium. Additionally, I am having her maintain her multivitamin with minerals, valACYclovir, acyclovir cream, aspirin, TURMERIC PO, atorvastatin, PARoxetine, vitamin C, Iron-Vit C-Vit B12-Folic Acid (IRON 937 PLUS PO), onetouch ultrasoft, omeprazole, losartan-hydrochlorothiazide, pramipexole, glucose blood, ONE TOUCH ULTRA MINI, fenofibrate, metFORMIN, GLIPIZIDE XL, LORazepam, oxybutynin, pramipexole, glipiZIDE, and CARTIA XT.   Meds ordered this encounter  Medications  . fesoterodine (TOVIAZ) 4 MG TB24 tablet    Sig: TAKE 1 TABLET(4 MG) BY MOUTH DAILY    Dispense:  30 tablet    Refill:  3    Order Specific Question:  Supervising Provider    Answer:  Pricilla Holm A [[id number]]  . Diclofenac Sodium (PENNSAID) 2 % SOLN    Sig: Place 1 application onto the skin 2 (two) times daily as needed.    Dispense:  112 g    Refill:  1    Order Specific Question:  Supervising Provider    Answer:  Pricilla Holm A [[id number]]     Follow-up: Return in about 3 weeks (around 03/22/2016), or if symptoms worsen or fail to improve.  Mauricio Po, FNP

## 2016-03-01 NOTE — Patient Instructions (Addendum)
Thank you for choosing [facility].  Summary/Instructions:  Ice x 20 minutes every 2 hours as needed.  Anti-inflammatories.  Brace x 3 weeks as able and at night.  Your prescription(s) have been submitted to your pharmacy or been printed and provided for you. Please take as directed and contact our office if you believe you are having problem(s) with the medication(s) or have any questions.  If your symptoms worsen or fail to improve, please contact our office for further instruction, or in case of emergency go directly to the emergency room at the closest medical facility.   Alfonse Ras Disease   Alfonse Ras disease is inflammation of the tendon on the thumb side of the wrist. Tendons are cords of tissue that connect bones to muscles. The tendons in your hand pass through a tunnel, or sheath. A slippery layer of tissue (synovium) lets the tendons move smoothly in the sheath. With de Quervain disease, the sheath swells or thickens, causing friction and pain. The condition is also called de Quervain tendinosis and de Quervain syndrome. It occurs most often in women who are 39-21 years old. CAUSES  The exact cause of de Quervain disease is not known. It may result from:   Overusing your hands, especially with repetitive motions that involve twisting your hand or using a forceful grip.  Pregnancy.  Rheumatoid disease. RISK FACTORS You may have a greater risk for de Quervain disease if you:  Are a middle-aged woman.  Are pregnant.  Have rheumatoid arthritis.  Have diabetes.  Use your hands far more than normal, especially with a tight grip or excessive twisting. SIGNS AND SYMPTOMS Pain on the thumb side of your wrist is the main symptom of de Quervain disease. Other signs and symptoms include:  Pain that gets worse when you grasp something or turn your wrist.  Pain that extends up the forearm.  Cysts in the area of the pain.  Swelling of your wrist and hand.  A  sensation of snapping in the wrist.  Trouble moving the thumb and wrist. DIAGNOSIS  Your health care provider may diagnose de Quervain disease based on your signs and symptoms. A physical exam will also be done. A simple test [place] test) that involves pulling your thumb and wrist to see if this causes pain can help determine whether you have the condition. Sometimes you may need to have an X-ray.  TREATMENT  Avoiding any activity that causes pain and swelling is the best treatment. Other options include:  Wearing a splint.  Taking medicine. Anti-inflammatory medicines and corticosteroid injections may reduce inflammation and relieve pain.  Having surgery if other treatments do not work. HOME CARE INSTRUCTIONS   Using ice can be helpful after doing activities that involve the sore wrist. To apply ice to the injured area:  Put ice in a plastic bag.  Place a towel between your skin and the bag.  Leave the ice on for 20 minutes, 2-3 times a day.  Take medicines only as directed by your health care provider.  Wear your splint as directed. This will allow your hand to rest and heal. SEEK MEDICAL CARE IF:   Your pain medicine does not help.   Your pain gets worse.  You develop new symptoms. MAKE SURE YOU:   Understand these instructions.  Will watch your condition.  Will get help right away if you are not doing well or get worse.   This information is not intended to replace advice given to you  by your health care provider. Make sure you discuss any questions you have with your health care provider.   Document Released: 04/25/2001 Document Revised: 08/21/2014 Document Reviewed: 12/03/2013 Elsevier Interactive Patient Education [employer].

## 2016-03-01 NOTE — Progress Notes (Signed)
Pre visit review using our clinic review tool, if applicable. No additional management support is needed unless otherwise documented below in the visit note. 

## 2016-03-01 NOTE — Assessment & Plan Note (Signed)
Overactive bladder stable with improved symptoms and able to sleep through the night with current dosage of Toviaz. Mild xerostomia. Continue current dosage of Toviaz.

## 2016-03-01 NOTE — Assessment & Plan Note (Signed)
Restless leg syndrome improved with increased dosage of Mirapex. No adverse side effects noted. Continue current dosage of Mirapex.

## 2016-03-01 NOTE — Assessment & Plan Note (Signed)
Symptoms and exam consistent with take remains to no synovitis of the right thumb. Treat conservatively with ice and thumb spica splint for the next 3 weeks minimizing use as able. Start Pennsaid. Patient noted improvement upon placement of the thumb spica splint. Follow-up in 3 weeks or sooner if symptoms worsen or do not improve.

## 2016-03-16 ENCOUNTER — Other Ambulatory Visit: Payer: Self-pay | Admitting: Family

## 2016-03-20 ENCOUNTER — Ambulatory Visit (INDEPENDENT_AMBULATORY_CARE_PROVIDER_SITE_OTHER): Payer: 59 | Admitting: Family

## 2016-03-20 ENCOUNTER — Encounter: Payer: Self-pay | Admitting: Family

## 2016-03-20 VITALS — BP 112/65 | HR 82 | Temp 98.3°F | Resp 16 | Ht 64.0 in | Wt 248.0 lb

## 2016-03-20 DIAGNOSIS — M654 Radial styloid tenosynovitis [de Quervain]: Secondary | ICD-10-CM

## 2016-03-20 NOTE — Patient Instructions (Addendum)
Thank you for choosing [facility].  Summary/Instructions:  Please continue to wear your splint.  Ice x 20 minutes every 2 hours.   If your symptoms worsen or fail to improve, please contact our office for further instruction, or in case of emergency go directly to the emergency room at the closest medical facility.   De Quervain Tenosynovitis Tendons attach muscles to bones. They also help with joint movements. When tendons become irritated or swollen, it is called tendinitis. The extensor pollicis brevis (EPB) tendon connects the EPB muscle to a bone that is near the base of the thumb. The EPB muscle helps to straighten and extend the thumb. De Quervain tenosynovitis is a condition in which the EPB tendon lining (sheath) becomes irritated, thickened, and swollen. This condition is sometimes called stenosing tenosynovitis. This condition causes pain on the thumb side of the back of the wrist. CAUSES Causes of this condition include:  Activities that repeatedly cause your thumb and wrist to extend.  A sudden increase in activity or change in activity that affects your wrist. RISK FACTORS: This condition is more likely to develop in:  Females.  People who have diabetes.  Women who have recently given birth.  People who are over 51 years of age.  People who do activities that involve repeated hand and wrist motions, such as tennis, racquetball, volleyball, gardening, and taking care of children.  People who do heavy labor.  People who have poor wrist strength and flexibility.  People who do not warm up properly before activities. SYMPTOMS Symptoms of this condition include:  Pain or tenderness over the thumb side of the back of the wrist when your thumb and wrist are not moving.  Pain that gets worse when you straighten your thumb or extend your thumb or wrist.  Pain when the injured area is touched.  Locking or catching of the thumb joint while you bend and  straighten your thumb.  Decreased thumb motion due to pain.  Swelling over the affected area. DIAGNOSIS This condition is diagnosed with a medical history and physical exam. Your health care provider will ask for details about your injury and ask about your symptoms. TREATMENT Treatment may include the use of icing and medicines to reduce pain and swelling. You may also be advised to wear a splint or brace to limit your thumb and wrist motion. In less severe cases, treatment may also include working with a physical therapist to strengthen your wrist and calm the irritation around your EPB tendon sheath. In severe cases, surgery may be needed. HOME CARE INSTRUCTIONS If You Have a Splint or Brace:  Wear it as told by your health care provider. Remove it only as told by your health care provider.  Loosen the splint or brace if your fingers become numb and tingle, or if they turn cold and blue.  Keep the splint or brace clean and dry. Managing Pain, Stiffness, and Swelling   If directed, apply ice to the injured area.  Put ice in a plastic bag.  Place a towel between your skin and the bag.  Leave the ice on for 20 minutes, 2-3 times per day.  Move your fingers often to avoid stiffness and to lessen swelling.  Raise (elevate) the injured area above the level of your heart while you are sitting or lying down. General Instructions  Return to your normal activities as told by your health care provider. Ask your health care provider what activities are safe for you.  Take over-the-counter and prescription medicines only as told by your health care provider.  Keep all follow-up visits as told by your health care provider. This is important.  Do not drive or operate heavy machinery while taking prescription pain medicine. SEEK MEDICAL CARE IF:  Your pain, tenderness, or swelling gets worse, even if you have had treatment.  You have numbness or tingling in your wrist, hand, or fingers  on the injured side.   This information is not intended to replace advice given to you by your health care provider. Make sure you discuss any questions you have with your health care provider.   Document Released: 07/31/2005 Document Revised: 04/21/2015 Document Reviewed: 10/06/2014 Elsevier Interactive Patient Education [employer].

## 2016-03-20 NOTE — Progress Notes (Signed)
Subjective:    Patient ID: Diane Gregory, female    DOB: [birth year], 60 y.o.   MRN: [id number]  Chief Complaint  Patient presents with  . Thumb pain    still having pain in right thumb and wrist, wondering about getting an xray    HPI:  Diane Gregory is a 60 y.o. female who  has a past medical history of Anxiety; Arthritis; Bronchitis; Chronic bronchitis ([place]); Depression; GERD (gastroesophageal reflux disease); High triglycerides; HSV infection; Hyperlipidemia; Hypertension; Left knee DJD; Obesity; Primary localized osteoarthritis of right knee; Restless leg syndrome; Sleep apnea; Type II diabetes mellitus ([place]); Urgency of urination; Urinary incontinence; and Walking pneumonia (~ 2013). and presents today for a follow up office visit.   Continues to experience the associated symptoms of pain located in her right wrist and thumb. Has been wearing the thumb spica splint, ice and Pennsaid which seem to help some. Using a little less. Pain is described as sharp, dull and achy. No numbness or tingling.   Allergies  Allergen Reactions  . Celebrex [Celecoxib] Swelling  . Lidocaine Other (See Comments)    "bad taste in mouth" with dental work  . Lisinopril Cough  . Meloxicam Swelling  . Nsaids Swelling  . Penicillins Other (See Comments)    Drop in blood pressure     Current Outpatient Prescriptions on File Prior to Visit  Medication Sig Dispense Refill  . acyclovir cream (ZOVIRAX) 5 % Apply 1 application topically every 3 (three) hours as needed (out break).    . Ascorbic Acid (VITAMIN C) 1000 MG tablet Take 1,000 mg by mouth daily.    Marland Kitchen aspirin 81 MG tablet Take 81 mg by mouth daily.    Marland Kitchen atorvastatin (LIPITOR) 10 MG tablet TAKE 1 TABLET BY MOUTH DAILY 30 tablet 0  . Blood Glucose Monitoring Suppl (ONE TOUCH ULTRA MINI) w/Device KIT Use glucose meter 1-4 times daily as instructed to check blood sugar. 1 each 0  . CARTIA XT 240 MG 24 hr capsule TAKE 1 CAPSULE BY MOUTH ONCE DAILY 30  capsule 0  . CARTIA XT 240 MG 24 hr capsule TAKE 1 CAPSULE BY MOUTH ONCE DAILY 30 capsule 0  . Diclofenac Sodium (PENNSAID) 2 % SOLN Place 1 application onto the skin 2 (two) times daily as needed. 112 g 1  . fenofibrate 160 MG tablet TAKE 1 TABLET(160 MG) BY MOUTH DAILY 30 tablet 0  . fesoterodine (TOVIAZ) 4 MG TB24 tablet TAKE 1 TABLET(4 MG) BY MOUTH DAILY 30 tablet 3  . glipiZIDE (GLUCOTROL XL) 2.5 MG 24 hr tablet TAKE 1 TABLET(2.5 MG) BY MOUTH DAILY WITH BREAKFAST 90 tablet 1  . GLIPIZIDE XL 5 MG 24 hr tablet TAKE 1 TABLET(5 MG) BY MOUTH DAILY WITH BREAKFAST 90 tablet 1  . glucose blood (ONE TOUCH ULTRA TEST) test strip Use as instructed 100 each 12  . Iron-Vit C-Vit B12-Folic Acid (IRON 786 PLUS PO) Take 65 mg by mouth.    . Lancets (ONETOUCH ULTRASOFT) lancets Use as instructed 100 each 12  . LORazepam (ATIVAN) 0.5 MG tablet TAKE 1-3 TABLETS BY MOUTH 30 MINUTES PRIOR TO IMAGING FOR ANXIETY 3 tablet 0  . losartan-hydrochlorothiazide (HYZAAR) 100-25 MG tablet TAKE 1 TABLET BY MOUTH DAILY 30 tablet 0  . losartan-hydrochlorothiazide (HYZAAR) 100-25 MG tablet TAKE 1 TABLET BY MOUTH DAILY 30 tablet 0  . metFORMIN (GLUCOPHAGE) 500 MG tablet Take 1 tablet (500 mg total) by mouth 2 (two) times daily with a meal. 180 tablet  1  . Multiple Vitamin (MULTIVITAMIN WITH MINERALS) TABS tablet Take 1 tablet by mouth daily.    Marland Kitchen omeprazole (PRILOSEC) 10 MG capsule Take this 30 minutes before breakfast and 30 minutes before the evening meal for 8 weeks 90 capsule 3  . oxybutynin (DITROPAN-XL) 5 MG 24 hr tablet TAKE 1 TABLET(5 MG) BY MOUTH AT BEDTIME 30 tablet 0  . PARoxetine (PAXIL) 40 MG tablet TAKE 1 TABLET BY MOUTH EVERY MORNING 30 tablet 0  . pramipexole (MIRAPEX) 0.25 MG tablet Take 1 tablet (0.25 mg total) by mouth daily. 30 tablet 2  . pramipexole (MIRAPEX) 1 MG tablet Take 1 tablet (1 mg total) by mouth at bedtime. 90 tablet 1  . TURMERIC PO Take 1 tablet by mouth daily. Takes '300mg'$  daily    .  valACYclovir (VALTREX) 1000 MG tablet Take 1,000 mg by mouth as needed (out break).      No current facility-administered medications on file prior to visit.      Past Surgical History:  Procedure Laterality Date  . COLONOSCOPY    . HYSTEROSCOPY  11/94   submuous myomas  . KNEE ARTHROSCOPY Right ~ 2010  . KNEE ARTHROSCOPY W/ MENISCECTOMY Right 2010  . NASAL SEPTUM SURGERY    . REFRACTIVE SURGERY Bilateral ~ 2005  . TONSILLECTOMY  ~ 2004  . TOTAL KNEE ARTHROPLASTY Left 08/11/2013   Procedure: LEFT TOTAL KNEE ARTHROPLASTY;  Surgeon: Lorn Junes, MD;  Location: [place];  Service: Orthopedics;  Laterality: Left;  . TOTAL KNEE ARTHROPLASTY Right 05/04/2014   Procedure: RIGHT TOTAL KNEE ARTHROPLASTY;  Surgeon: Lorn Junes, MD;  Location: [facility];  Service: Orthopedics;  Laterality: Right;  . UVULOPALATOPHARYNGOPLASTY, TONSILLECTOMY AND SEPTOPLASTY  2004  . VAGINAL HYSTERECTOMY  06/1993     Review of Systems    Objective:    BP 112/65 (BP Location: Left Arm, Patient Position: Sitting, Cuff Size: Large)   Pulse 82   Temp 98.3 F (36.8 C) (Oral)   Resp 16   Ht '5\' 4"'$  (1.626 m)   Wt 248 lb (112.5 kg)   SpO2 93%   BMI 42.57 kg/m  Nursing note and vital signs reviewed.  Physical Exam  Constitutional: She is oriented to person, place, and time. She appears well-developed and well-nourished. No distress.  Cardiovascular: Normal rate, regular rhythm, normal heart sounds and intact distal pulses.   Pulmonary/Chest: Effort normal and breath sounds normal.  Musculoskeletal:  Right wrist/thumb - no obvious deformity or discoloration with mild/moderate edema. There is tenderness and crepitus noted along the abductor pollicis longus. Range of motion remains slightly decreased secondary to discomfort. Capillary refill is intact and appropriate. Positive Finkelstein's test.  Neurological: She is alert and oriented to person, place, and time.  Skin: Skin is warm and dry.  Psychiatric: She  has a normal mood and affect. Her behavior is normal. Judgment and thought content normal.       Assessment & Plan:   Problem List Items Addressed This Visit      Musculoskeletal and Integument   De Quervain's tenosynovitis, right - Primary    Symptoms and exam remain consistent with decreased remains consistent with DeQuervain's that has been refractory to conservative treatment including icing and bracing. Refer to orthopedics placed for possible injection and additional therapy. Continue current dosage of tumor and Tylenol as needed for discomfort. Follow-up if symptoms worsen or fail to improve prior to appointment with orthopedics.      Relevant Orders   AMB referral to  orthopedics    Other Visit Diagnoses   None.      I am having Ms. Rufener maintain her multivitamin with minerals, valACYclovir, acyclovir cream, aspirin, TURMERIC PO, atorvastatin, PARoxetine, vitamin C, Iron-Vit C-Vit B12-Folic Acid (IRON 005 PLUS PO), onetouch ultrasoft, omeprazole, losartan-hydrochlorothiazide, pramipexole, glucose blood, ONE TOUCH ULTRA MINI, fenofibrate, metFORMIN, GLIPIZIDE XL, LORazepam, oxybutynin, pramipexole, glipiZIDE, CARTIA XT, fesoterodine, Diclofenac Sodium, losartan-hydrochlorothiazide, and CARTIA XT.   Follow-up: Return if symptoms worsen or fail to improve.  Mauricio Po, FNP

## 2016-03-20 NOTE — Assessment & Plan Note (Signed)
Symptoms and exam remain consistent with decreased remains consistent with DeQuervain's that has been refractory to conservative treatment including icing and bracing. Refer to orthopedics placed for possible injection and additional therapy. Continue current dosage of tumor and Tylenol as needed for discomfort. Follow-up if symptoms worsen or fail to improve prior to appointment with orthopedics.

## 2016-04-11 ENCOUNTER — Other Ambulatory Visit: Payer: Self-pay | Admitting: Family

## 2016-04-11 DIAGNOSIS — G2581 Restless legs syndrome: Secondary | ICD-10-CM

## 2016-05-01 ENCOUNTER — Other Ambulatory Visit: Payer: Self-pay | Admitting: Family

## 2016-05-16 ENCOUNTER — Other Ambulatory Visit: Payer: Self-pay | Admitting: Family

## 2016-06-15 ENCOUNTER — Other Ambulatory Visit: Payer: Self-pay | Admitting: Family

## 2016-06-28 ENCOUNTER — Encounter: Payer: Self-pay | Admitting: Family

## 2016-06-28 ENCOUNTER — Ambulatory Visit (INDEPENDENT_AMBULATORY_CARE_PROVIDER_SITE_OTHER): Payer: 59 | Admitting: Family

## 2016-06-28 VITALS — BP 132/68 | HR 74 | Temp 98.1°F | Resp 16 | Ht 64.0 in | Wt 241.4 lb

## 2016-06-28 DIAGNOSIS — G2581 Restless legs syndrome: Secondary | ICD-10-CM

## 2016-06-28 DIAGNOSIS — Z23 Encounter for immunization: Secondary | ICD-10-CM

## 2016-06-28 MED ORDER — ROPINIROLE HCL 0.25 MG PO TABS: ORAL_TABLET | ORAL | 1 refills | Status: DC

## 2016-06-28 NOTE — Progress Notes (Signed)
Subjective:    Patient ID: Diane Gregory, female    DOB: Dec 17, 1955, 60 y.o.   MRN: [id number]  Chief Complaint  Patient presents with  . Medication Management    mirapex    HPI:  Diane Gregory is a 60 y.o. female who  has a past medical history of Anxiety; Arthritis; Bronchitis; Chronic bronchitis ([place]); Depression; GERD (gastroesophageal reflux disease); High triglycerides; HSV infection; Hyperlipidemia; Hypertension; Left knee DJD; Obesity; Primary localized osteoarthritis of right knee; Restless leg syndrome; Sleep apnea; Type II diabetes mellitus ([place]); Urgency of urination; Urinary incontinence; and Walking pneumonia (~ 2013). and presents today for a follow up office visit.   Restless leg syndrome - Currently maintained on Mirapex. Reports taking medication as prescribed and denies adverse side effects.Symptoms of restless leg have continued to occur despite increases in the medication. Severity of the symptoms is disturbing her ability to go to sleep and is generally restless starting around early evening. It does effect her lifestyle as she feels like the symptoms make her nervous.   Allergies  Allergen Reactions  . Celebrex [Celecoxib] Swelling  . Lidocaine Other (See Comments)    "bad taste in mouth" with dental work  . Lisinopril Cough  . Meloxicam Swelling  . Nsaids Swelling  . Penicillins Other (See Comments)    Drop in blood pressure      Outpatient Medications Prior to Visit  Medication Sig Dispense Refill  . acyclovir cream (ZOVIRAX) 5 % Apply 1 application topically every 3 (three) hours as needed (out break).    . Ascorbic Acid (VITAMIN C) 1000 MG tablet Take 1,000 mg by mouth daily.    Marland Kitchen aspirin 81 MG tablet Take 81 mg by mouth daily.    Marland Kitchen atorvastatin (LIPITOR) 10 MG tablet TAKE 1 TABLET BY MOUTH DAILY 30 tablet 0  . Blood Glucose Monitoring Suppl (ONE TOUCH ULTRA MINI) w/Device KIT Use glucose meter 1-4 times daily as instructed to check blood sugar. 1 each 0   . CARTIA XT 240 MG 24 hr capsule TAKE 1 CAPSULE BY MOUTH ONCE DAILY 30 capsule 0  . CARTIA XT 240 MG 24 hr capsule TAKE 1 CAPSULE BY MOUTH ONCE DAILY 30 capsule 0  . Diclofenac Sodium (PENNSAID) 2 % SOLN Place 1 application onto the skin 2 (two) times daily as needed. 112 g 1  . fenofibrate 160 MG tablet TAKE 1 TABLET(160 MG) BY MOUTH DAILY 30 tablet 0  . fesoterodine (TOVIAZ) 4 MG TB24 tablet TAKE 1 TABLET(4 MG) BY MOUTH DAILY 30 tablet 3  . glipiZIDE (GLUCOTROL XL) 2.5 MG 24 hr tablet TAKE 1 TABLET(2.5 MG) BY MOUTH DAILY WITH BREAKFAST 90 tablet 1  . GLIPIZIDE XL 5 MG 24 hr tablet TAKE 1 TABLET(5 MG) BY MOUTH DAILY WITH BREAKFAST 90 tablet 1  . glucose blood (ONE TOUCH ULTRA TEST) test strip Use as instructed 100 each 12  . Iron-Vit C-Vit B12-Folic Acid (IRON 637 PLUS PO) Take 65 mg by mouth.    . Lancets (ONETOUCH ULTRASOFT) lancets Use as instructed 100 each 12  . losartan-hydrochlorothiazide (HYZAAR) 100-25 MG tablet TAKE 1 TABLET BY MOUTH DAILY 30 tablet 0  . metFORMIN (GLUCOPHAGE) 500 MG tablet TAKE 1 TABLET(500 MG) BY MOUTH TWICE DAILY WITH A MEAL 180 tablet 1  . Multiple Vitamin (MULTIVITAMIN WITH MINERALS) TABS tablet Take 1 tablet by mouth daily.    Marland Kitchen omeprazole (PRILOSEC) 10 MG capsule Take this 30 minutes before breakfast and 30 minutes before the evening meal  for 8 weeks 90 capsule 3  . oxybutynin (DITROPAN-XL) 5 MG 24 hr tablet TAKE 1 TABLET(5 MG) BY MOUTH AT BEDTIME 30 tablet 0  . PARoxetine (PAXIL) 40 MG tablet TAKE 1 TABLET(40 MG) BY MOUTH EVERY MORNING 90 tablet 1  . TURMERIC PO Take 1 tablet by mouth daily. Takes '300mg'$  daily    . valACYclovir (VALTREX) 1000 MG tablet Take 1,000 mg by mouth as needed (out break).     . CARTIA XT 240 MG 24 hr capsule TAKE 1 CAPSULE BY MOUTH ONCE DAILY 30 capsule 0  . CARTIA XT 240 MG 24 hr capsule TAKE 1 CAPSULE BY MOUTH ONCE DAILY 90 capsule 1  . CARTIA XT 240 MG 24 hr capsule TAKE 1 CAPSULE BY MOUTH ONCE DAILY 90 capsule 1  . LORazepam  (ATIVAN) 0.5 MG tablet TAKE 1-3 TABLETS BY MOUTH 30 MINUTES PRIOR TO IMAGING FOR ANXIETY 3 tablet 0  . losartan-hydrochlorothiazide (HYZAAR) 100-25 MG tablet TAKE 1 TABLET BY MOUTH DAILY 90 tablet 1  . PARoxetine (PAXIL) 40 MG tablet TAKE 1 TABLET BY MOUTH EVERY MORNING 30 tablet 0  . pramipexole (MIRAPEX) 0.25 MG tablet TAKE 1 TABLET(0.25 MG) BY MOUTH DAILY 90 tablet 1  . pramipexole (MIRAPEX) 1 MG tablet TAKE 1 TABLET(1 MG) BY MOUTH AT BEDTIME 90 tablet 1   No facility-administered medications prior to visit.     Review of Systems  Constitutional: Negative for chills and fever.  Respiratory: Negative for chest tightness and shortness of breath.   Cardiovascular: Negative for chest pain, palpitations and leg swelling.  Neurological: Negative for dizziness, seizures, weakness and numbness.       Positive for restless legs.       Objective:    BP 132/68 (BP Location: Left Arm, Patient Position: Sitting, Cuff Size: Large)   Pulse 74   Temp 98.1 F (36.7 C) (Oral)   Resp 16   Ht '5\' 4"'$  (1.626 m)   Wt 241 lb 6.4 oz (109.5 kg)   SpO2 94%   BMI 41.44 kg/m  Nursing note and vital signs reviewed.  Physical Exam  Constitutional: She is oriented to person, place, and time. She appears well-developed and well-nourished. No distress.  Cardiovascular: Normal rate, regular rhythm, normal heart sounds and intact distal pulses.   Pulmonary/Chest: Effort normal and breath sounds normal.  Musculoskeletal:  Bilateral lower lower extremities with no obvious deformity, discoloration, or edema. Range of motion within normal limits. Muscle strength normal. Distal pulses and sensation are intact and appropriate.   Neurological: She is alert and oriented to person, place, and time.  Skin: Skin is warm and dry.  Psychiatric: She has a normal mood and affect. Her behavior is normal. Judgment and thought content normal.       Assessment & Plan:   Problem List Items Addressed This Visit      Other     Restless leg syndrome - Primary    Restless leg syndrome remains labile with current medication regimen. Discontinue Mirapex. Start Requip. Titrate medication up slowly. If symptoms worsen or do not improve with new medication refer to neurology for further assessment and management.          I have discontinued Ms. Shatswell's LORazepam, pramipexole, and pramipexole. I am also having her start on rOPINIRole. Additionally, I am having her maintain her multivitamin with minerals, valACYclovir, acyclovir cream, aspirin, TURMERIC PO, atorvastatin, vitamin C, Iron-Vit C-Vit B12-Folic Acid (IRON 967 PLUS PO), onetouch ultrasoft, omeprazole, losartan-hydrochlorothiazide, glucose blood, ONE TOUCH  ULTRA MINI, fenofibrate, GLIPIZIDE XL, oxybutynin, glipiZIDE, CARTIA XT, fesoterodine, Diclofenac Sodium, PARoxetine, CARTIA XT, and metFORMIN.   Meds ordered this encounter  Medications  . rOPINIRole (REQUIP) 0.25 MG tablet    Sig: Take 1 tablet by mouth 1-3 hours before bed. May increase to 0.5 mg after 2 days if needed and may increase to 1 mg nightly after 7 day.    Dispense:  30 tablet    Refill:  1    Order Specific Question:   Supervising Provider    Answer:   Pricilla Holm A [[id number]]     Follow-up: Return in about 3 months (around 09/28/2016), or if symptoms worsen or fail to improve.  Mauricio Po, FNP

## 2016-06-28 NOTE — Assessment & Plan Note (Signed)
Restless leg syndrome remains labile with current medication regimen. Discontinue Mirapex. Start Requip. Titrate medication up slowly. If symptoms worsen or do not improve with new medication refer to neurology for further assessment and management.

## 2016-06-28 NOTE — Patient Instructions (Signed)
Thank you for choosing [facility].  SUMMARY AND INSTRUCTIONS:  Follow up via or phone for titration of medication.   Medication:  Your prescription(s) have been submitted to your pharmacy or been printed and provided for you. Please take as directed and contact our office if you believe you are having problem(s) with the medication(s) or have any questions.  Follow up:  If your symptoms worsen or fail to improve, please contact our office for further instruction, or in case of emergency go directly to the emergency room at the closest medical facility.   Restless Legs Syndrome Introduction Restless legs syndrome is a condition that causes uncomfortable feelings or sensations in the legs, especially while sitting or lying down. The sensations usually cause an overwhelming urge to move the legs. The arms can also sometimes be affected. The condition can range from mild to severe. The symptoms often interfere with a person's ability to sleep. What are the causes? The cause of this condition is not known. What increases the risk? This condition is more likely to develop in:  People who are older than age 61.  Pregnant women. In general, restless legs syndrome is more common in women than in men.  People who have a family history of the condition.  People who have certain medical conditions, such as iron deficiency, kidney disease, Parkinson disease, or nerve damage.  People who take certain medicines, such as medicines for high blood pressure, nausea, colds, allergies, depression, and some heart conditions. What are the signs or symptoms? The main symptom of this condition is uncomfortable sensations in the legs. These sensations may be:  Described as pulling, tingling, prickling, throbbing, crawling, or burning.  Worse while you are sitting or lying down.  Worse during periods of rest or inactivity.  Worse at night, often interfering with your sleep.  Accompanied by a  very strong urge to move your legs.  Temporarily relieved by movement of your legs. The sensations usually affect both sides of the body. The arms can also be affected, but this is rare. People who have this condition often have tiredness during the day because of their lack of sleep at night. How is this diagnosed? This condition may be diagnosed based on your description of the symptoms. You may also have tests, including blood tests, to check for other conditions that may lead to your symptoms. In some cases, you may be asked to spend some time in a sleep lab so your sleeping can be monitored. How is this treated? Treatment for this condition is focused on managing the symptoms. Treatment may include:  Self-help and lifestyle changes.  Medicines. Follow these instructions at home:  Take medicines only as directed by your health care provider.  Try these methods to get temporary relief from the uncomfortable sensations:  Massage your legs.  Walk or stretch.  Take a cold or hot bath.  Practice good sleep habits. For example, go to bed and get up at the same time every day.  Exercise regularly.  Practice ways of relaxing, such as yoga or meditation.  Avoid caffeine and alcohol.  Do not use any tobacco products, including cigarettes, chewing tobacco, or electronic cigarettes. If you need help quitting, ask your health care provider.  Keep all follow-up visits as directed by your health care provider. This is important. Contact a health care provider if: Your symptoms do not improve with treatment, or they get worse. This information is not intended to replace advice given to you by  your health care provider. Make sure you discuss any questions you have with your health care provider. Document Released: 07/21/2002 Document Revised: 01/06/2016 Document Reviewed: 07/27/2014  2017 Elsevier

## 2016-07-11 ENCOUNTER — Telehealth: Payer: Self-pay | Admitting: Family

## 2016-07-11 NOTE — Telephone Encounter (Signed)
Patient sent the following email to our website:  You asked me to contact you when I had been on the Requip for 2 weeks and used up my initial prescription. I graduated to the 4.25 dose with great success. 1 mg total seems to be the correct dosage for me.can you call in a script for 1 mg? Pt phone [phone].  PLEASE ask pt to communicate via MyChart and NOT the website, for these types of issues.

## 2016-07-12 MED ORDER — ROPINIROLE HCL 1 MG PO TABS: 1.0000 mg | ORAL_TABLET | Freq: Every day | ORAL | 0 refills | Status: DC

## 2016-07-12 NOTE — Telephone Encounter (Signed)
Medication has been refilled.

## 2016-07-13 NOTE — Telephone Encounter (Signed)
LVM letting pt know.  

## 2016-07-17 ENCOUNTER — Other Ambulatory Visit: Payer: Self-pay | Admitting: Family

## 2016-07-17 DIAGNOSIS — K2100 Gastro-esophageal reflux disease with esophagitis, without bleeding: Secondary | ICD-10-CM

## 2016-07-17 DIAGNOSIS — N3281 Overactive bladder: Secondary | ICD-10-CM

## 2016-09-14 ENCOUNTER — Other Ambulatory Visit: Payer: Self-pay | Admitting: Family

## 2016-09-14 MED ORDER — ATORVASTATIN CALCIUM 10 MG PO TABS: 10.0000 mg | ORAL_TABLET | Freq: Every day | ORAL | 1 refills | Status: DC

## 2016-09-14 MED ORDER — ROPINIROLE HCL 1 MG PO TABS: 1.0000 mg | ORAL_TABLET | Freq: Every day | ORAL | 1 refills | Status: DC

## 2016-09-18 ENCOUNTER — Encounter: Payer: Self-pay | Admitting: Family

## 2016-09-18 ENCOUNTER — Other Ambulatory Visit (INDEPENDENT_AMBULATORY_CARE_PROVIDER_SITE_OTHER): Payer: 59

## 2016-09-18 ENCOUNTER — Ambulatory Visit (INDEPENDENT_AMBULATORY_CARE_PROVIDER_SITE_OTHER): Payer: 59 | Admitting: Family

## 2016-09-18 VITALS — BP 124/64 | HR 72 | Temp 98.1°F | Resp 18 | Ht 64.0 in | Wt 236.0 lb

## 2016-09-18 DIAGNOSIS — R05 Cough: Secondary | ICD-10-CM

## 2016-09-18 DIAGNOSIS — E1165 Type 2 diabetes mellitus with hyperglycemia: Secondary | ICD-10-CM

## 2016-09-18 DIAGNOSIS — H539 Unspecified visual disturbance: Secondary | ICD-10-CM

## 2016-09-18 DIAGNOSIS — R51 Headache: Secondary | ICD-10-CM | POA: Diagnosis not present

## 2016-09-18 DIAGNOSIS — R059 Cough, unspecified: Secondary | ICD-10-CM

## 2016-09-18 DIAGNOSIS — IMO0001 Reserved for inherently not codable concepts without codable children: Secondary | ICD-10-CM

## 2016-09-18 LAB — MICROALBUMIN / CREATININE URINE RATIO
Creatinine,U: 50.2 mg/dL
MICROALB/CREAT RATIO: 1.4 mg/g (ref 0.0–30.0)
Microalb, Ur: <0.7 mg/dL (ref 0.0–1.9)

## 2016-09-18 LAB — COMPREHENSIVE METABOLIC PANEL
ALT: 86 U/L — ABNORMAL HIGH (ref 0–35)
AST: 58 U/L — ABNORMAL HIGH (ref 0–37)
Albumin: 4.4 g/dL (ref 3.5–5.2)
Alkaline Phosphatase: 84 U/L (ref 39–117)
BUN: 17 mg/dL (ref 6–23)
CHLORIDE: 97 meq/L (ref 96–112)
CO2: 26 meq/L (ref 19–32)
Calcium: 9.9 mg/dL (ref 8.4–10.5)
Creatinine, Ser: 0.59 mg/dL (ref 0.40–1.20)
GFR: 109.88 mL/min (ref >60.00)
GLUCOSE: 171 mg/dL — AB (ref 70–99)
POTASSIUM: 4 meq/L (ref 3.5–5.1)
Sodium: 135 mEq/L (ref 135–145)
TOTAL PROTEIN: 7.5 g/dL (ref 6.0–8.3)
Total Bilirubin: 0.5 mg/dL (ref 0.2–1.2)

## 2016-09-18 LAB — HEMOGLOBIN A1C: HEMOGLOBIN A1C: 8.3 % — AB (ref 4.6–6.5)

## 2016-09-18 LAB — TSH: TSH: 1.4 u[IU]/mL (ref 0.35–4.50)

## 2016-09-18 MED ORDER — PROMETHAZINE-CODEINE 6.25-10 MG/5ML PO SYRP: 5.0000 mL | ORAL_SOLUTION | Freq: Four times a day (QID) | ORAL | 0 refills | Status: DC | PRN

## 2016-09-18 NOTE — Patient Instructions (Signed)
Thank you for choosing [facility].  SUMMARY AND INSTRUCTIONS:  Please check on the price of Byderon B-cise or Trulicity.  Medication:  Use the promethazine-codeine as needed for cough and sleep  Mucinex to help break up secretions.   Drink plenty of fluids.   Your prescription(s) have been submitted to your pharmacy or been printed and provided for you. Please take as directed and contact our office if you believe you are having problem(s) with the medication(s) or have any questions.  Labs:  Please stop by the lab on the lower level of the building for your blood work. Your results will be released to [place] (or called to you) after review, usually within 72 hours after test completion. If any changes need to be made, you will be notified at that same time.  1.) The lab is open from 7:30am to 5:30 pm Monday-Friday 2.) No appointment is necessary 3.) Fasting (if needed) is 6-8 hours after food and drink; black coffee and water are okay   Follow up:  If your symptoms worsen or fail to improve, please contact our office for further instruction, or in case of emergency go directly to the emergency room at the closest medical facility.

## 2016-09-18 NOTE — Assessment & Plan Note (Signed)
Not formally tested for flu with no chest x-ray completed to confirm pneumonia. Appears to be improving with completion of azithromycin. No further antibiotics indicated at this time. Continue over-the-counter medications as needed for symptom relief and supportive care. Start promethazine-codeine as needed for cough and sleep. Follow-up if symptoms worsen or do not improve.

## 2016-09-18 NOTE — Progress Notes (Signed)
Subjective:    Patient ID: Diane Gregory, female    DOB: 10/30/1955, 61 y.o.   MRN: [id number]  Chief Complaint  Patient presents with  . Cough    has a deep cough, was dx with pneumonia and flu last week, thinks she now has bronchitis    HPI:  Diane Gregory is a 61 y.o. female who  has a past medical history of Anxiety; Arthritis; Bronchitis; Chronic bronchitis ([place]); Depression; GERD (gastroesophageal reflux disease); High triglycerides; HSV infection; Hyperlipidemia; Hypertension; Left knee DJD; Obesity; Primary localized osteoarthritis of right knee; Restless leg syndrome; Sleep apnea; Type II diabetes mellitus ([place]); Urgency of urination; Urinary incontinence; and Walking pneumonia (~ 2013). and presents today For an acute office visit.  1.) Cough - This is a new problem. Recently diagnosed with the flu about 1 week ago and completed the a course of Tamiflu. Was only diagnosed per patient with "a touch of pneumonia" which she was treated with doxycycline and then changed to azithromycin and tessalon pearles. She continues to experience the associated symptom of a cough. No fevers. Other modifying factors include OTC cough. Since the initial onset the cough symptoms are generally improving overall.   2.) Type 2 diabetes - Currently maintained on metformin and glipizide. Reports taking the medication as prescribed and denies adverse side effects or hypoglycemic readings. Home blood sugars have been averaging around 200. Was recently tested about 2 months ago and noted to have an A1c of 9.0. Denies numbness/tingling or symptoms of end organ damage. Working on [employer] to help with her nutrition and is not currently exercising regularly.   Lab Results  Component Value Date   HGBA1C 8.3 (H) 09/18/2016     Lab Results  Component Value Date   HGBA1C 8.3 (H) 09/18/2016     Allergies  Allergen Reactions  . Celebrex [Celecoxib] Swelling  . Lidocaine Other (See Comments)    "bad  taste in mouth" with dental work  . Lisinopril Cough  . Meloxicam Swelling  . Nsaids Swelling  . Penicillins Other (See Comments)    Drop in blood pressure      Outpatient Medications Prior to Visit  Medication Sig Dispense Refill  . acyclovir cream (ZOVIRAX) 5 % Apply 1 application topically every 3 (three) hours as needed (out break).    . Ascorbic Acid (VITAMIN C) 1000 MG tablet Take 1,000 mg by mouth daily.    Marland Kitchen aspirin 81 MG tablet Take 81 mg by mouth daily.    Marland Kitchen atorvastatin (LIPITOR) 10 MG tablet Take 1 tablet (10 mg total) by mouth daily. 90 tablet 1  . Blood Glucose Monitoring Suppl (ONE TOUCH ULTRA MINI) w/Device KIT Use glucose meter 1-4 times daily as instructed to check blood sugar. 1 each 0  . CARTIA XT 240 MG 24 hr capsule TAKE 1 CAPSULE BY MOUTH ONCE DAILY 30 capsule 0  . Diclofenac Sodium (PENNSAID) 2 % SOLN Place 1 application onto the skin 2 (two) times daily as needed. 112 g 1  . fenofibrate 160 MG tablet TAKE 1 TABLET(160 MG) BY MOUTH DAILY 30 tablet 0  . fenofibrate 160 MG tablet TAKE 1 TABLET(160 MG) BY MOUTH DAILY 90 tablet 1  . glipiZIDE (GLUCOTROL XL) 2.5 MG 24 hr tablet TAKE 1 TABLET(2.5 MG) BY MOUTH DAILY WITH BREAKFAST 90 tablet 1  . glipiZIDE (GLUCOTROL XL) 5 MG 24 hr tablet TAKE 1 TABLET(5 MG) BY MOUTH DAILY WITH BREAKFAST 90 tablet 1  . glucose blood (ONE  TOUCH ULTRA TEST) test strip Use as instructed 100 each 12  . Iron-Vit C-Vit B12-Folic Acid (IRON 509 PLUS PO) Take 65 mg by mouth.    . Lancets (ONETOUCH ULTRASOFT) lancets Use as instructed 100 each 12  . losartan-hydrochlorothiazide (HYZAAR) 100-25 MG tablet TAKE 1 TABLET BY MOUTH DAILY 30 tablet 0  . metFORMIN (GLUCOPHAGE) 500 MG tablet TAKE 1 TABLET(500 MG) BY MOUTH TWICE DAILY WITH A MEAL 180 tablet 1  . Multiple Vitamin (MULTIVITAMIN WITH MINERALS) TABS tablet Take 1 tablet by mouth daily.    Marland Kitchen omeprazole (PRILOSEC) 10 MG capsule TAKE ONE CAPSULE BY MOUTH TWICE DAILY, 30 MINUTES BEFORE BREAKFAST  AND 30 MINUTES BEFORE THE EVENING MEAL FOR 8 WEEKS 180 capsule 1  . oxybutynin (DITROPAN-XL) 5 MG 24 hr tablet TAKE 1 TABLET(5 MG) BY MOUTH AT BEDTIME 30 tablet 0  . PARoxetine (PAXIL) 40 MG tablet TAKE 1 TABLET(40 MG) BY MOUTH EVERY MORNING 90 tablet 1  . rOPINIRole (REQUIP) 1 MG tablet Take 1 tablet (1 mg total) by mouth at bedtime. 90 tablet 1  . TOVIAZ 4 MG TB24 tablet TAKE 1 TABLET(4 MG) BY MOUTH DAILY 30 tablet 5  . TURMERIC PO Take 1 tablet by mouth daily. Takes '300mg'$  daily    . valACYclovir (VALTREX) 1000 MG tablet Take 1,000 mg by mouth as needed (out break).     . CARTIA XT 240 MG 24 hr capsule TAKE 1 CAPSULE BY MOUTH ONCE DAILY 30 capsule 0   No facility-administered medications prior to visit.       Past Surgical History:  Procedure Laterality Date  . COLONOSCOPY    . HYSTEROSCOPY  11/94   submuous myomas  . KNEE ARTHROSCOPY Right ~ 2010  . KNEE ARTHROSCOPY W/ MENISCECTOMY Right 2010  . NASAL SEPTUM SURGERY    . REFRACTIVE SURGERY Bilateral ~ 2005  . TONSILLECTOMY  ~ 2004  . TOTAL KNEE ARTHROPLASTY Left 08/11/2013   Procedure: LEFT TOTAL KNEE ARTHROPLASTY;  Surgeon: Lorn Junes, MD;  Location: [facility];  Service: Orthopedics;  Laterality: Left;  . TOTAL KNEE ARTHROPLASTY Right 05/04/2014   Procedure: RIGHT TOTAL KNEE ARTHROPLASTY;  Surgeon: Lorn Junes, MD;  Location: [facility];  Service: Orthopedics;  Laterality: Right;  . UVULOPALATOPHARYNGOPLASTY, TONSILLECTOMY AND SEPTOPLASTY  2004  . VAGINAL HYSTERECTOMY  06/1993      Past Medical History:  Diagnosis Date  . Anxiety   . Arthritis    "was in my knees before OR; now in my hands" (05/05/2014)  . Bronchitis   . Chronic bronchitis ([place])    "used to get it q yr; not as much since septal repair in 2004"  . Depression   . GERD (gastroesophageal reflux disease)   . High triglycerides   . HSV infection   . Hyperlipidemia   . Hypertension   . Left knee DJD   . Obesity   . Primary localized osteoarthritis of  right knee   . Restless leg syndrome   . Sleep apnea    mild; does not wear CPAP  . Type II diabetes mellitus ([place])   . Urgency of urination    and frequency takes Vesicare  . Urinary incontinence   . Walking pneumonia ~ 2013      Review of Systems  Constitutional: Negative for chills and fever.  Eyes:       Denies changes in vision  Respiratory: Positive for cough. Negative for chest tightness, shortness of breath and wheezing.   Cardiovascular: Negative for  chest pain, palpitations and leg swelling.  Endocrine: Negative for polydipsia, polyphagia and polyuria.  Neurological: Negative for dizziness, weakness and numbness.      Objective:    BP 124/64 (BP Location: Left Arm, Patient Position: Sitting, Cuff Size: Large)   Pulse 72   Temp 98.1 F (36.7 C) (Oral)   Resp 18   Ht '5\' 4"'$  (1.626 m)   Wt 236 lb (107 kg)   SpO2 96%   BMI 40.51 kg/m  Nursing note and vital signs reviewed.  Physical Exam  Constitutional: She is oriented to person, place, and time. She appears well-developed and well-nourished.  Non-toxic appearance. She does not have a sickly appearance. She does not appear ill. No distress.  HENT:  Right Ear: Hearing, tympanic membrane, external ear and ear canal normal.  Left Ear: Hearing, tympanic membrane, external ear and ear canal normal.  Nose: Nose normal.  Mouth/Throat: Uvula is midline, oropharynx is clear and moist and mucous membranes are normal.  Cardiovascular: Normal rate, regular rhythm, normal heart sounds and intact distal pulses.   Pulmonary/Chest: Effort normal and breath sounds normal. No respiratory distress. She has no wheezes. She has no rales. She exhibits no tenderness.  Neurological: She is alert and oriented to person, place, and time.  Diabetic foot exam - bilateral feet are free from skin breakdown, cuts, and abrasions. Toenails are neatly trimmed. Pulses are intact and appropriate. Sensation is intact to monofilament bilaterally.    Skin: Skin is warm and dry.  Psychiatric: She has a normal mood and affect. Her behavior is normal. Judgment and thought content normal.       Assessment & Plan:   Problem List Items Addressed This Visit      Endocrine   Diabetes type 2, uncontrolled ([place])    Type 2 diabetes with most recent A1c of 9.0 per patient. Home blood sugar readings are around 200 growth indicating uncontrolled type 2 diabetes. Obtain A1c, urine microalbumin, and complete metabolic panel. Continue current dosage of metformin and glipizide pending A1c results. Maintained on losartan and atorvastatin for CAD risk reduction. Diabetic foot exam completed today. Diabetic eye exam up-to-date. Recommend increasing metformin or possible addition of GLP-1. Follow-up pending A1c results.      Relevant Orders   Comprehensive metabolic panel (Completed)   Urine Microalbumin w/creat. ratio (Completed)   Hemoglobin A1c (Completed)     Other   Cough - Primary    Not formally tested for flu with no chest x-ray completed to confirm pneumonia. Appears to be improving with completion of azithromycin. No further antibiotics indicated at this time. Continue over-the-counter medications as needed for symptom relief and supportive care. Start promethazine-codeine as needed for cough and sleep. Follow-up if symptoms worsen or do not improve.      Relevant Medications   promethazine-codeine (PHENERGAN WITH CODEINE) 6.25-10 MG/5ML syrup       I am having Ms. Nong start on promethazine-codeine. I am also having her maintain her multivitamin with minerals, valACYclovir, acyclovir cream, aspirin, TURMERIC PO, vitamin C, Iron-Vit C-Vit B12-Folic Acid (IRON 098 PLUS PO), onetouch ultrasoft, losartan-hydrochlorothiazide, glucose blood, ONE TOUCH ULTRA MINI, fenofibrate, oxybutynin, Diclofenac Sodium, PARoxetine, CARTIA XT, metFORMIN, omeprazole, glipiZIDE, glipiZIDE, TOVIAZ, fenofibrate, atorvastatin, and rOPINIRole.   Meds ordered this  encounter  Medications  . promethazine-codeine (PHENERGAN WITH CODEINE) 6.25-10 MG/5ML syrup    Sig: Take 5 mLs by mouth every 6 (six) hours as needed.    Dispense:  180 mL    Refill:  0  Order Specific Question:   Supervising Provider    Answer:   Pricilla Holm A [[id number]]     Follow-up: Return in about 1 month (around 10/16/2016), or if symptoms worsen or fail to improve.  Mauricio Po, FNP

## 2016-09-18 NOTE — Assessment & Plan Note (Signed)
Type 2 diabetes with most recent A1c of 9.0 per patient. Home blood sugar readings are around 200 growth indicating uncontrolled type 2 diabetes. Obtain A1c, urine microalbumin, and complete metabolic panel. Continue current dosage of metformin and glipizide pending A1c results. Maintained on losartan and atorvastatin for CAD risk reduction. Diabetic foot exam completed today. Diabetic eye exam up-to-date. Recommend increasing metformin or possible addition of GLP-1. Follow-up pending A1c results.

## 2016-09-20 MED ORDER — METFORMIN HCL ER 500 MG PO TB24: 1000.0000 mg | ORAL_TABLET | Freq: Every day | ORAL | 2 refills | Status: DC

## 2016-10-08 ENCOUNTER — Other Ambulatory Visit: Payer: Self-pay | Admitting: Family

## 2016-10-31 ENCOUNTER — Other Ambulatory Visit: Payer: Self-pay | Admitting: Family

## 2016-11-20 NOTE — Progress Notes (Signed)
61 y.o. [id number] Single Caucasian female here for annual exam.    Not sure if she feels a left breast lump.  Was not able to complete her mammogram due to this without an office visit.   Taking [place] for her bladder.  Vesicare worked better but patient did not get approval for this as she had not tried Toviaz first.   Hgb A1C is 8.3 2 months ago.  Will retest next week.   Daughter marrying in [place] to retire soon.   PCP:   Terri Piedra  No LMP recorded. Patient has had a hysterectomy.           Sexually active: No.  The current method of family planning is status post hysterectomy--ovaries remain.    Exercising: Yes.    walking Smoker:  no  Health Maintenance: Pap:  2000 Neg History of abnormal Pap:  no MMG: 11-03-15 3D Density A/Neg/BiRads1:[facility] -- patient would like to discuss a possible lump in left breast Colonoscopy: 05-12-15 polyp with Dr. Monna Fam due 04/2025   BMD: 2007  Result :normal:SE Radiology TDaP:  2008 Gardasil:   n/a HIV: done years ago per patient Hep C: 03/05/14 -- Negative Screening Labs:  Hb today: PCP takes care of labs   reports that she has never smoked. She has never used smokeless tobacco. She reports that she drinks about 0.6 oz of alcohol per week . She reports that she does not use drugs.  Past Medical History:  Diagnosis Date  . Anxiety   . Arthritis    "was in my knees before OR; now in my hands" (05/05/2014)  . Bronchitis   . Chronic bronchitis ([place])    "used to get it q yr; not as much since septal repair in 2004"  . Depression   . GERD (gastroesophageal reflux disease)   . High triglycerides   . HSV infection   . Hyperlipidemia   . Hypertension   . Left knee DJD   . Obesity   . Primary localized osteoarthritis of right knee   . Restless leg syndrome   . Sleep apnea    mild; does not wear CPAP  . Type II diabetes mellitus ([place])   . Urgency of urination    and frequency takes Vesicare  . Urinary incontinence    . Walking pneumonia ~ 2013    Past Surgical History:  Procedure Laterality Date  . COLONOSCOPY    . HYSTEROSCOPY  11/94   submuous myomas  . KNEE ARTHROSCOPY Right ~ 2010  . KNEE ARTHROSCOPY W/ MENISCECTOMY Right 2010  . NASAL SEPTUM SURGERY    . REFRACTIVE SURGERY Bilateral ~ 2005  . TONSILLECTOMY  ~ 2004  . TOTAL KNEE ARTHROPLASTY Left 08/11/2013   Procedure: LEFT TOTAL KNEE ARTHROPLASTY;  Surgeon: Lorn Junes, MD;  Location: [facility];  Service: Orthopedics;  Laterality: Left;  . TOTAL KNEE ARTHROPLASTY Right 05/04/2014   Procedure: RIGHT TOTAL KNEE ARTHROPLASTY;  Surgeon: Lorn Junes, MD;  Location: [facility];  Service: Orthopedics;  Laterality: Right;  . UVULOPALATOPHARYNGOPLASTY, TONSILLECTOMY AND SEPTOPLASTY  2004  . VAGINAL HYSTERECTOMY  06/1993    Current Outpatient Prescriptions  Medication Sig Dispense Refill  . acyclovir cream (ZOVIRAX) 5 % Apply 1 application topically every 3 (three) hours as needed (out break).    . Ascorbic Acid (VITAMIN C) 1000 MG tablet Take 1,000 mg by mouth daily.    Marland Kitchen aspirin 81 MG tablet Take 81 mg by mouth daily.    Marland Kitchen  atorvastatin (LIPITOR) 10 MG tablet Take 1 tablet (10 mg total) by mouth daily. 90 tablet 1  . Blood Glucose Monitoring Suppl (ONE TOUCH ULTRA MINI) w/Device KIT Use glucose meter 1-4 times daily as instructed to check blood sugar. 1 each 0  . CARTIA XT 240 MG 24 hr capsule TAKE 1 CAPSULE BY MOUTH ONCE DAILY 30 capsule 0  . Diclofenac Sodium (PENNSAID) 2 % SOLN Place 1 application onto the skin 2 (two) times daily as needed. 112 g 1  . fenofibrate 160 MG tablet TAKE 1 TABLET(160 MG) BY MOUTH DAILY 30 tablet 0  . glipiZIDE (GLUCOTROL XL) 2.5 MG 24 hr tablet TAKE 1 TABLET(2.5 MG) BY MOUTH DAILY WITH BREAKFAST 90 tablet 1  . glipiZIDE (GLUCOTROL XL) 5 MG 24 hr tablet TAKE 1 TABLET(5 MG) BY MOUTH DAILY WITH BREAKFAST 90 tablet 1  . glucose blood (ONE TOUCH ULTRA TEST) test strip Use as instructed 100 each 12  . Iron-Vit C-Vit  B12-Folic Acid (IRON 073 PLUS PO) Take 65 mg by mouth.    . Lancets (ONETOUCH ULTRASOFT) lancets Use as instructed 100 each 12  . losartan-hydrochlorothiazide (HYZAAR) 100-25 MG tablet TAKE 1 TABLET BY MOUTH DAILY 30 tablet 0  . metFORMIN (GLUCOPHAGE) 500 MG tablet Take 1 tablet by mouth 2 (two) times daily.    . metFORMIN (GLUCOPHAGE-XR) 500 MG 24 hr tablet TAKE 2 TABLETS BY MOUTH DAILY WITH BREAKFAST 180 tablet 1  . Misc Natural Products (OSTEO BI-FLEX ADV JOINT SHIELD) TABS     . Multiple Vitamin (MULTIVITAMIN WITH MINERALS) TABS tablet Take 1 tablet by mouth daily.    Marland Kitchen omeprazole (PRILOSEC) 10 MG capsule TAKE ONE CAPSULE BY MOUTH TWICE DAILY, 30 MINUTES BEFORE BREAKFAST AND 30 MINUTES BEFORE THE EVENING MEAL FOR 8 WEEKS 180 capsule 1  . oxybutynin (DITROPAN-XL) 5 MG 24 hr tablet TAKE 1 TABLET(5 MG) BY MOUTH AT BEDTIME 30 tablet 0  . PARoxetine (PAXIL) 40 MG tablet TAKE 1 TABLET(40 MG) BY MOUTH EVERY MORNING 90 tablet 0  . promethazine-codeine (PHENERGAN WITH CODEINE) 6.25-10 MG/5ML syrup Take 5 mLs by mouth every 6 (six) hours as needed. 180 mL 0  . rOPINIRole (REQUIP) 1 MG tablet Take 1 tablet (1 mg total) by mouth at bedtime. 90 tablet 1  . TOVIAZ 4 MG TB24 tablet TAKE 1 TABLET(4 MG) BY MOUTH DAILY 30 tablet 5  . TURMERIC PO Take 1 tablet by mouth daily. Takes '300mg'$  daily    . valACYclovir (VALTREX) 1000 MG tablet Take 1,000 mg by mouth as needed (out break).      No current facility-administered medications for this visit.     Family History  Problem Relation Age of Onset  . Heart disease Mother   . Hypertension Mother   . Stroke Mother   . Breast cancer Mother   . Diabetes Mellitus II Father   . Cancer Father     CLL  . Hypertension Brother   . Breast cancer Paternal Grandmother   . Breast cancer Maternal Grandmother     ROS:  Pertinent items are noted in HPI.  Otherwise, a comprehensive ROS was negative.  Exam:   BP 128/60 (BP Location: Right Arm, Patient Position:  Sitting, Cuff Size: Normal)   Pulse 72   Resp 16   Ht 5' 3.75" (1.619 m)   Wt 239 lb (108.4 kg)   BMI 41.35 kg/m     General appearance: alert, cooperative and appears stated age Head: Normocephalic, without obvious abnormality, atraumatic Neck: no  adenopathy, supple, symmetrical, trachea midline and thyroid normal to inspection and palpation Lungs: clear to auscultation bilaterally Breasts: right breast - normal appearance, no masses or tenderness, No nipple retraction or dimpling, No nipple discharge or bleeding, No axillary or supraclavicular adenopathy.  Left breast with 1 cm mass versus fibroglandular change at 6:00.  Heart: regular rate and rhythm Abdomen: obese, soft, non-tender; no masses, no organomegaly Extremities: extremities normal, atraumatic, no cyanosis or edema Skin: Skin color, texture, turgor normal. No rashes or lesions Lymph nodes: Cervical, supraclavicular, and axillary nodes normal. No abnormal inguinal nodes palpated Neurologic: Grossly normal  Pelvic: External genitalia:  Mild erythema of vulva in general.               Urethra:  normal appearing urethra with no masses, tenderness or lesions              Bartholins and Skenes: normal                 Vagina: normal appearing vagina with normal color and discharge, no lesions              Cervix:  Absent.              Pap taken: No. Bimanual Exam:  Uterus:   Absent.               Adnexa: no mass, fullness, tenderness              Rectal exam: Yes.  .  Confirms.              Anus:  normal sphincter tone, no lesions  Chaperone was present for exam.  Assessment:   Well woman visit with normal exam. Family history of breast cancer.  Mixed incontinence. Overactive bladder is more important component.  PCP managing.  Obesity.  History of HSV, probably type I.  Diabetes mellitus.  Probable yeast vulvitis.  Asymptomatic per patient.  Possible left breast mass.   Plan: Mammogram screening discussed.   Bilateral dx mammogram and left breast ultrasound.  Recommended self breast awareness. Pap and HR HPV as above. Guidelines for Calcium, Vitamin D, regular exercise program including cardiovascular and weight bearing exercise. On Toviaz.  She will follow up with PCP for her bladder care.  I mentioned that she can also see urology if she is not getting control of her bladder functioning.  We talked about the importance of good blood sugar control. Follow up annually and prn.        After visit summary provided.

## 2016-11-22 ENCOUNTER — Ambulatory Visit (INDEPENDENT_AMBULATORY_CARE_PROVIDER_SITE_OTHER): Payer: 59 | Admitting: Obstetrics and Gynecology

## 2016-11-22 ENCOUNTER — Encounter: Payer: Self-pay | Admitting: Obstetrics and Gynecology

## 2016-11-22 VITALS — BP 128/60 | HR 72 | Resp 16 | Ht 63.75 in | Wt 239.0 lb

## 2016-11-22 DIAGNOSIS — Z01419 Encounter for gynecological examination (general) (routine) without abnormal findings: Secondary | ICD-10-CM | POA: Diagnosis not present

## 2016-11-22 DIAGNOSIS — Z23 Encounter for immunization: Secondary | ICD-10-CM | POA: Diagnosis not present

## 2016-11-22 DIAGNOSIS — N6320 Unspecified lump in the left breast, unspecified quadrant: Secondary | ICD-10-CM | POA: Diagnosis not present

## 2016-11-22 NOTE — Patient Instructions (Signed)

## 2016-11-22 NOTE — Progress Notes (Signed)
Scheduled patient while in office for bilateral diagnostic mammogram with left breast ultrasound at [facility] on 12/30/2016 at 8:15 am. Patient is agreeable to date and time. Placed in mammogram hold.

## 2016-11-28 ENCOUNTER — Encounter: Payer: Self-pay | Admitting: Family

## 2016-11-28 ENCOUNTER — Ambulatory Visit (INDEPENDENT_AMBULATORY_CARE_PROVIDER_SITE_OTHER): Payer: 59 | Admitting: Family

## 2016-11-28 VITALS — BP 122/64 | HR 77 | Temp 98.1°F | Resp 16 | Ht 63.75 in | Wt 238.8 lb

## 2016-11-28 DIAGNOSIS — G4730 Sleep apnea, unspecified: Secondary | ICD-10-CM

## 2016-11-28 DIAGNOSIS — N3281 Overactive bladder: Secondary | ICD-10-CM | POA: Diagnosis not present

## 2016-11-28 DIAGNOSIS — E1165 Type 2 diabetes mellitus with hyperglycemia: Secondary | ICD-10-CM | POA: Diagnosis not present

## 2016-11-28 DIAGNOSIS — IMO0001 Reserved for inherently not codable concepts without codable children: Secondary | ICD-10-CM

## 2016-11-28 DIAGNOSIS — E782 Mixed hyperlipidemia: Secondary | ICD-10-CM | POA: Diagnosis not present

## 2016-11-28 LAB — POCT GLYCOSYLATED HEMOGLOBIN (HGB A1C): Hemoglobin A1C: 7.2

## 2016-11-28 NOTE — Patient Instructions (Addendum)
Thank you for choosing [facility].  SUMMARY AND INSTRUCTIONS:  Take 2 tablets of Metformin XR in the morning and 2 in the evening.  Continue with other medications as prescribed.  They will call to schedule your appointment with sleep medicine.  Stop the atorvastatin x 1 month to determine effect on muscle pains.  Recommend continued exercise and stretching.   Medication:  Your prescription(s) have been submitted to your pharmacy or been printed and provided for you. Please take as directed and contact our office if you believe you are having problem(s) with the medication(s) or have any questions.  Follow up:  If your symptoms worsen or fail to improve, please contact our office for further instruction, or in case of emergency go directly to the emergency room at the closest medical facility.

## 2016-11-28 NOTE — Assessment & Plan Note (Signed)
Body aches with concern for possible statin related origin. Will hold atorvastatin x 1 month to determine reaction if any. No evidence of rhabdomyolysis or other adverse side effects. Continue to monitor.

## 2016-11-28 NOTE — Progress Notes (Signed)
Subjective:    Patient ID: Diane Gregory, female    DOB: 1956/02/09, 61 y.o.   MRN: [id number]  Chief Complaint  Patient presents with  . Fatigue    not able to sleep well and body aches, wants A1c check, thinks diabetes is causing her urinary frequency    HPI:  Diane Gregory is a 61 y.o. female who  has a past medical history of Anxiety; Arthritis; Bronchitis; Chronic bronchitis ([place]); Depression; GERD (gastroesophageal reflux disease); High triglycerides; HSV infection; Hyperlipidemia; Hypertension; Left knee DJD; Obesity; Primary localized osteoarthritis of right knee; Restless leg syndrome; Sleep apnea; Type II diabetes mellitus ([place]); Urgency of urination; Urinary incontinence; and Walking pneumonia (~ 2013). and presents today for a follow up office visit.    Associated symptoms of inability to sleep, body aches, and urinary freqeuncy have been going on for a couple of months. Reports taking the Toviaz as prescribed and denies adverse side effects. Has helped some but continues to experience urinary frequency that is worse consistently. Not currently using CPAP at night for her sleep apnea. For her diabetes she is maintained on metformin and glipizide. Reports taking the medications as prescribed and denies adverse side effects or hypoglycemic readings. Home blood sugar readings are in the 180s on average. Does have increase thirst. No numbness/tingling.   Lab Results  Component Value Date   HGBA1C 7.2 11/28/2016     Allergies  Allergen Reactions  . Celebrex [Celecoxib] Swelling  . Lidocaine Other (See Comments)    "bad taste in mouth" with dental work  . Lisinopril Cough  . Meloxicam Swelling  . Nsaids Swelling  . Penicillins Other (See Comments)    Drop in blood pressure  . Statins Other (See Comments)    edema      Outpatient Medications Prior to Visit  Medication Sig Dispense Refill  . acyclovir cream (ZOVIRAX) 5 % Apply 1 application topically every 3 (three) hours  as needed (out break).    . Ascorbic Acid (VITAMIN C) 1000 MG tablet Take 1,000 mg by mouth daily.    Marland Kitchen aspirin 81 MG tablet Take 81 mg by mouth daily.    Marland Kitchen atorvastatin (LIPITOR) 10 MG tablet Take 1 tablet (10 mg total) by mouth daily. 90 tablet 1  . Blood Glucose Monitoring Suppl (ONE TOUCH ULTRA MINI) w/Device KIT Use glucose meter 1-4 times daily as instructed to check blood sugar. 1 each 0  . CARTIA XT 240 MG 24 hr capsule TAKE 1 CAPSULE BY MOUTH ONCE DAILY 30 capsule 0  . fenofibrate 160 MG tablet TAKE 1 TABLET(160 MG) BY MOUTH DAILY 30 tablet 0  . glipiZIDE (GLUCOTROL XL) 2.5 MG 24 hr tablet TAKE 1 TABLET(2.5 MG) BY MOUTH DAILY WITH BREAKFAST 90 tablet 1  . glipiZIDE (GLUCOTROL XL) 5 MG 24 hr tablet TAKE 1 TABLET(5 MG) BY MOUTH DAILY WITH BREAKFAST 90 tablet 1  . glucose blood (ONE TOUCH ULTRA TEST) test strip Use as instructed 100 each 12  . Iron-Vit C-Vit B12-Folic Acid (IRON 269 PLUS PO) Take 65 mg by mouth.    . Lancets (ONETOUCH ULTRASOFT) lancets Use as instructed 100 each 12  . losartan-hydrochlorothiazide (HYZAAR) 100-25 MG tablet TAKE 1 TABLET BY MOUTH DAILY 30 tablet 0  . metFORMIN (GLUCOPHAGE-XR) 500 MG 24 hr tablet TAKE 2 TABLETS BY MOUTH DAILY WITH BREAKFAST 180 tablet 1  . Misc Natural Products (OSTEO BI-FLEX ADV JOINT SHIELD) TABS     . Multiple Vitamin (MULTIVITAMIN WITH MINERALS) TABS  tablet Take 1 tablet by mouth daily.    Marland Kitchen omeprazole (PRILOSEC) 10 MG capsule TAKE ONE CAPSULE BY MOUTH TWICE DAILY, 30 MINUTES BEFORE BREAKFAST AND 30 MINUTES BEFORE THE EVENING MEAL FOR 8 WEEKS 180 capsule 1  . PARoxetine (PAXIL) 40 MG tablet TAKE 1 TABLET(40 MG) BY MOUTH EVERY MORNING 90 tablet 0  . rOPINIRole (REQUIP) 1 MG tablet Take 1 tablet (1 mg total) by mouth at bedtime. 90 tablet 1  . TOVIAZ 4 MG TB24 tablet TAKE 1 TABLET(4 MG) BY MOUTH DAILY 30 tablet 5  . TURMERIC PO Take 1 tablet by mouth daily. Takes 343m daily    . valACYclovir (VALTREX) 1000 MG tablet Take 1,000 mg by mouth  as needed (out break).     . Diclofenac Sodium (PENNSAID) 2 % SOLN Place 1 application onto the skin 2 (two) times daily as needed. 112 g 1  . metFORMIN (GLUCOPHAGE) 500 MG tablet Take 1 tablet by mouth 2 (two) times daily.    .Marland Kitchenoxybutynin (DITROPAN-XL) 5 MG 24 hr tablet TAKE 1 TABLET(5 MG) BY MOUTH AT BEDTIME 30 tablet 0  . promethazine-codeine (PHENERGAN WITH CODEINE) 6.25-10 MG/5ML syrup Take 5 mLs by mouth every 6 (six) hours as needed. 180 mL 0   No facility-administered medications prior to visit.       Past Surgical History:  Procedure Laterality Date  . COLONOSCOPY    . HYSTEROSCOPY  11/94   submuous myomas  . KNEE ARTHROSCOPY Right ~ 2010  . KNEE ARTHROSCOPY W/ MENISCECTOMY Right 2010  . NASAL SEPTUM SURGERY    . REFRACTIVE SURGERY Bilateral ~ 2005  . TONSILLECTOMY  ~ 2004  . TOTAL KNEE ARTHROPLASTY Left 08/11/2013   Procedure: LEFT TOTAL KNEE ARTHROPLASTY;  Surgeon: RLorn Junes MD;  [facility] [facility]  Service: Orthopedics;  Laterality: Left;  . TOTAL KNEE ARTHROPLASTY Right 05/04/2014   Procedure: RIGHT TOTAL KNEE ARTHROPLASTY;  Surgeon: RLorn Junes MD;  [facility] [facility]  Service: Orthopedics;  Laterality: Right;  . UVULOPALATOPHARYNGOPLASTY, TONSILLECTOMY AND SEPTOPLASTY  2004  . VAGINAL HYSTERECTOMY  06/1993      Past Medical History:  Diagnosis Date  . Anxiety   . Arthritis    "was in my knees before OR; now in my hands" (05/05/2014)  . Bronchitis   . [place] bronchitis ([place]    "used to get it q yr; not as much since septal repair in 2004"  . Depression   . GERD (gastroesophageal reflux disease)   . High triglycerides   . HSV infection   . Hyperlipidemia   . Hypertension   . Left knee DJD   . Obesity   . Primary localized osteoarthritis of right knee   . Restless leg syndrome   . Sleep apnea    mild; does not wear CPAP  . Type II diabetes mellitus ([place]   . Urgency of urination    and frequency takes Vesicare  . Urinary incontinence   .  Walking pneumonia ~ 2013      Review of Systems  Constitutional: Negative for chills, fatigue and fever.  Eyes:       Denies changes in vision  Respiratory: Negative for chest tightness and shortness of breath.   Cardiovascular: Negative for chest pain, palpitations and leg swelling.  Endocrine: Negative for polydipsia, polyphagia and polyuria.  Genitourinary: Positive for frequency.  Neurological: Negative for weakness and numbness.      Objective:    BP 122/64 (BP Location: Left Arm, Patient Position:  Sitting, Cuff Size: Large)   Pulse 77   Temp 98.1 F (36.7 C) (Oral)   Resp 16   Ht 5' 3.75" (1.619 m)   Wt 238 lb 12.8 oz (108.3 kg)   SpO2 94%   BMI 41.31 kg/m  Nursing note and vital signs reviewed.  Physical Exam  Constitutional: She is oriented to person, place, and time. She appears well-developed and well-nourished. No distress.  Cardiovascular: Normal rate, regular rhythm, normal heart sounds and intact distal pulses.   Pulmonary/Chest: Effort normal and breath sounds normal.  Abdominal: Normal appearance and bowel sounds are normal. She exhibits no mass. There is no tenderness. There is no CVA tenderness.  Neurological: She is alert and oriented to person, place, and time.  Skin: Skin is warm and dry.  Psychiatric: She has a normal mood and affect. Her behavior is normal. Judgment and thought content normal.       Assessment & Plan:   Problem List Items Addressed This Visit      Respiratory   Sleep apnea - Primary    Previously diagnosed and not currently maintained on CPAP. May be contributing factor in diabetes as well urinary frequency. Referral placed to sleep medicine for follow up. Recommend weight loss of 5-10% of current body weight through nutrition and physical activity.         Endocrine   Diabetes type 2, uncontrolled ([place])    Diabetes with improving control and in office A1c of 7.2. May still be a contributing factor to her current symptoms.  Continue current dosage of glipizide. Metformin adjusted to make regimen easier. Start Bydureon B-cise. Continue to monitor blood sugars at home. Recommend physical activity and following a modified carbohydrate type diet.      Relevant Orders   POCT HgB A1C (Completed)     Genitourinary   Overactive bladder    Overactive bladder symptoms with some mild improvements with addition of Toviaz. Continues to have urinary frequency however may have alternative causes including sleep apnea and diabetes. Continue current dosage of Tovaiz.         Other   Hyperlipidemia    Body aches with concern for possible statin related origin. Will hold atorvastatin x 1 month to determine reaction if any. No evidence of rhabdomyolysis or other adverse side effects. Continue to monitor.           I have discontinued Ms. Chaloux's oxybutynin, Diclofenac Sodium, and promethazine-codeine. I am also having her maintain her multivitamin with minerals, valACYclovir, acyclovir cream, aspirin, TURMERIC PO, vitamin C, Iron-Vit C-Vit B12-Folic Acid (IRON 829 PLUS PO), onetouch ultrasoft, losartan-hydrochlorothiazide, glucose blood, ONE TOUCH ULTRA MINI, fenofibrate, CARTIA XT, omeprazole, glipiZIDE, glipiZIDE, TOVIAZ, atorvastatin, rOPINIRole, PARoxetine, metFORMIN, and OSTEO BI-FLEX ADV JOINT SHIELD.   Follow-up: Return in about 3 months (around 02/27/2017), or if symptoms worsen or fail to improve.  Mauricio Po, FNP

## 2016-11-28 NOTE — Assessment & Plan Note (Signed)
Overactive bladder symptoms with some mild improvements with addition of Toviaz. Continues to have urinary frequency however may have alternative causes including sleep apnea and diabetes. Continue current dosage of Tovaiz.

## 2016-11-28 NOTE — Assessment & Plan Note (Signed)
Previously diagnosed and not currently maintained on CPAP. May be contributing factor in diabetes as well urinary frequency. Referral placed to sleep medicine for follow up. Recommend weight loss of 5-10% of current body weight through nutrition and physical activity.

## 2016-11-28 NOTE — Assessment & Plan Note (Signed)
Diabetes with improving control and in office A1c of 7.2. May still be a contributing factor to her current symptoms. Continue current dosage of glipizide. Metformin adjusted to make regimen easier. Start Bydureon B-cise. Continue to monitor blood sugars at home. Recommend physical activity and following a modified carbohydrate type diet.

## 2016-11-30 ENCOUNTER — Other Ambulatory Visit: Payer: Self-pay | Admitting: Family

## 2016-11-30 LAB — HM MAMMOGRAPHY

## 2016-12-06 ENCOUNTER — Other Ambulatory Visit: Payer: Self-pay

## 2016-12-06 ENCOUNTER — Encounter: Payer: Self-pay | Admitting: Family

## 2016-12-06 MED ORDER — FESOTERODINE FUMARATE ER 8 MG PO TB24: 8.0000 mg | ORAL_TABLET | Freq: Every day | ORAL | 1 refills | Status: DC

## 2016-12-06 MED ORDER — METFORMIN HCL ER 500 MG PO TB24: ORAL_TABLET | ORAL | 1 refills | Status: DC

## 2016-12-12 ENCOUNTER — Encounter: Payer: Self-pay | Admitting: Family

## 2016-12-13 MED ORDER — EXENATIDE ER 2 MG/0.85ML ~~LOC~~ AUIJ: 2.0000 mg | AUTO-INJECTOR | SUBCUTANEOUS | 2 refills | Status: DC

## 2016-12-27 ENCOUNTER — Other Ambulatory Visit: Payer: Self-pay | Admitting: Family

## 2016-12-27 DIAGNOSIS — N3281 Overactive bladder: Secondary | ICD-10-CM

## 2017-01-02 ENCOUNTER — Ambulatory Visit (INDEPENDENT_AMBULATORY_CARE_PROVIDER_SITE_OTHER): Payer: 59 | Admitting: Neurology

## 2017-01-02 ENCOUNTER — Encounter: Payer: Self-pay | Admitting: Neurology

## 2017-01-02 ENCOUNTER — Telehealth: Payer: Self-pay

## 2017-01-02 VITALS — BP 138/68 | HR 80 | Resp 18 | Ht 63.75 in | Wt 240.0 lb

## 2017-01-02 DIAGNOSIS — R351 Nocturia: Secondary | ICD-10-CM

## 2017-01-02 DIAGNOSIS — G4761 Periodic limb movement disorder: Secondary | ICD-10-CM

## 2017-01-02 DIAGNOSIS — G2581 Restless legs syndrome: Secondary | ICD-10-CM

## 2017-01-02 DIAGNOSIS — Z6841 Body Mass Index (BMI) 40.0 and over, adult: Secondary | ICD-10-CM

## 2017-01-02 DIAGNOSIS — Z9889 Other specified postprocedural states: Secondary | ICD-10-CM | POA: Diagnosis not present

## 2017-01-02 DIAGNOSIS — R400 Somnolence: Secondary | ICD-10-CM

## 2017-01-02 DIAGNOSIS — G4733 Obstructive sleep apnea (adult) (pediatric): Secondary | ICD-10-CM

## 2017-01-02 NOTE — Progress Notes (Signed)
Subjective:    Patient ID: Diane Gregory is a 61 y.o. female.  HPI     Star Age, MD, PhD [facility] [address]O. [place], [place] [address]  Dear Belenda Cruise,   I saw your patient, Diane Gregory, upon your kind request in my neurologic clinic today for initial consultation of her sleep disorder, in particular, concern for underlying obstructive sleep apnea. The patient is unaccompanied today. As you know, [name] is a 61 year old right-handed woman with an underlying medical history of reflux disease, depression, chronic bronchitis, anxiety, arthritis, s/p b/l TKAs in 2015 and 2016, hyperlipidemia, hypertension, restless leg syndrome, type 2 diabetes, and morbid obesity with a BMI of over 40, who reports snoring and excessive daytime somnolence. I reviewed your office note from 11/28/2016. She was previously diagnosed with obstructive sleep apnea, as I understand, but is currently not on CPAP therapy. Prior sleep study results are not available for my review today. Sleep study testing was about 15 years ago. She had airway surgery after that. This included tonsillectomy and correction of deviated septum. Her Epworth sleepiness score is 11 out of 24, fatigue score is 37 out of 63. For her restless leg syndrome she had been on Mirapex for years, but it became less effective. She switched to Requip last year, currently 1 mg at bedtime which she feels has been helpful. The patient is widowed, she has 2 grown children. She lives alone. She works at a [employer], As an Web designer. She is a nonsmoker and drinks alcohol infrequently, maybe once or twice a month, drinks caffeine usually 2-3 servings per day, usually before 4 PM. Her bedtime is generally between 10 and 11 PM. Wakeup time is around 6 AM. She has tried p.m. type medications for occasional difficulty falling asleep but has daytime grogginess. She does have nocturia once or twice per average  night, she denies morning headaches. She had some Low back pain. She is not aware of any formal diagnosis of sleep apnea and her family about her father snored loudly and her brother snores.  Her Past Medical History Is Significant For: Past Medical History:  Diagnosis Date  . Anxiety   . Arthritis    "was in my knees before OR; now in my hands" (05/05/2014)  . Bronchitis   . Chronic bronchitis ([place])    "used to get it q yr; not as much since septal repair in 2004"  . Depression   . GERD (gastroesophageal reflux disease)   . High triglycerides   . HSV infection   . Hyperlipidemia   . Hypertension   . Left knee DJD   . Obesity   . Primary localized osteoarthritis of right knee   . Restless leg syndrome   . Sleep apnea    mild; does not wear CPAP  . Type II diabetes mellitus ([place])   . Urgency of urination    and frequency takes Vesicare  . Urinary incontinence   . Walking pneumonia ~ 2013    Her Past Surgical History Is Significant For: Past Surgical History:  Procedure Laterality Date  . COLONOSCOPY    . HYSTEROSCOPY  11/94   submuous myomas  . KNEE ARTHROSCOPY Right ~ 2010  . KNEE ARTHROSCOPY W/ MENISCECTOMY Right 2010  . NASAL SEPTUM SURGERY    . REFRACTIVE SURGERY Bilateral ~ 2005  . TONSILLECTOMY  ~ 2004  . TOTAL KNEE ARTHROPLASTY Left 08/11/2013   Procedure: LEFT TOTAL KNEE ARTHROPLASTY;  Surgeon: Lorn Junes, MD;  Location: [facility];  Service: Orthopedics;  Laterality: Left;  . TOTAL KNEE ARTHROPLASTY Right 05/04/2014   Procedure: RIGHT TOTAL KNEE ARTHROPLASTY;  Surgeon: Lorn Junes, MD;  Location: [facility];  Service: Orthopedics;  Laterality: Right;  . UVULOPALATOPHARYNGOPLASTY, TONSILLECTOMY AND SEPTOPLASTY  2004  . VAGINAL HYSTERECTOMY  06/1993    Her Family History Is Significant For: Family History  Problem Relation Age of Onset  . Heart disease Mother   . Hypertension Mother   . Stroke Mother   . Breast cancer Mother   . Diabetes Mellitus II Father    . Cancer Father        CLL  . Hypertension Brother   . Breast cancer Paternal Grandmother   . Breast cancer Maternal Grandmother     Her Social History Is Significant For: Social History   Social History  . Marital status: Widowed    Spouse name: N/A  . Number of children: 2  . Years of education: 16   Occupational History  . Intake Coordinator    Social History Main Topics  . Smoking status: Never Smoker  . Smokeless tobacco: Never Used  . Alcohol use 0.6 oz/week    1 Standard drinks or equivalent per week     Comment: 05/05/2014 "might have a drink once/month"  . Drug use: No  . Sexual activity: Not Currently    Partners: Male    Birth control/ protection: Surgical     Comment: [facility] 1994   Other Topics Concern  . None   Social History Narrative   Currently single. Fun: Crafts, flea markets, antiquing.    Denies religious beliefs that effect health care.    Caffeine: 2-3 drinks a day     Her Allergies Are:  Allergies  Allergen Reactions  . Celebrex [Celecoxib] Swelling  . Lidocaine Other (See Comments)    "bad taste in mouth" with dental work  . Lisinopril Cough  . Meloxicam Swelling  . Nsaids Swelling  . Penicillins Other (See Comments)    Drop in blood pressure  . Statins Other (See Comments)    edema  :   Her Current Medications Are:  Outpatient Encounter Prescriptions as of 01/02/2017  Medication Sig  . aspirin 81 MG tablet Take 81 mg by mouth daily.  Marland Kitchen CARTIA XT 240 MG 24 hr capsule TAKE 1 CAPSULE BY MOUTH EVERY DAY  . Exenatide ER (BYDUREON BCISE) 2 MG/0.85ML AUIJ Inject 2 mg into the skin once a week.  . fenofibrate 160 MG tablet TAKE 1 TABLET(160 MG) BY MOUTH DAILY  . fesoterodine (TOVIAZ) 8 MG TB24 tablet Take 1 tablet (8 mg total) by mouth daily.  Marland Kitchen glipiZIDE (GLUCOTROL XL) 2.5 MG 24 hr tablet TAKE 1 TABLET(2.5 MG) BY MOUTH DAILY WITH BREAKFAST  . glipiZIDE (GLUCOTROL XL) 5 MG 24 hr tablet TAKE 1 TABLET(5 MG) BY MOUTH DAILY WITH BREAKFAST  .  glucose blood (ONE TOUCH ULTRA TEST) test strip Use as instructed  . losartan-hydrochlorothiazide (HYZAAR) 100-25 MG tablet TAKE 1 TABLET BY MOUTH DAILY  . metFORMIN (GLUCOPHAGE-XR) 500 MG 24 hr tablet TAKE 2 TABLETS BY MOUTH DAILY WITH BREAKFAST  . Misc Natural Products (OSTEO BI-FLEX ADV JOINT SHIELD) TABS   . Multiple Vitamin (MULTIVITAMIN WITH MINERALS) TABS tablet Take 1 tablet by mouth daily.  Marland Kitchen omeprazole (PRILOSEC) 10 MG capsule TAKE ONE CAPSULE BY MOUTH TWICE DAILY, 30 MINUTES BEFORE BREAKFAST AND 30 MINUTES BEFORE THE EVENING MEAL FOR 8 WEEKS  .  PARoxetine (PAXIL) 40 MG tablet TAKE 1 TABLET(40 MG) BY MOUTH EVERY MORNING  . rOPINIRole (REQUIP) 1 MG tablet Take 1 tablet (1 mg total) by mouth at bedtime.  . TURMERIC PO Take 1 tablet by mouth daily. Takes 340m daily  . [DISCONTINUED] acyclovir cream (ZOVIRAX) 5 % Apply 1 application topically every 3 (three) hours as needed (out break).  . [DISCONTINUED] Ascorbic Acid (VITAMIN C) 1000 MG tablet Take 1,000 mg by mouth daily.  . [DISCONTINUED] atorvastatin (LIPITOR) 10 MG tablet Take 1 tablet (10 mg total) by mouth daily.  . [DISCONTINUED] Blood Glucose Monitoring Suppl (ONE TOUCH ULTRA MINI) w/Device KIT Use glucose meter 1-4 times daily as instructed to check blood sugar.  . [DISCONTINUED] CARTIA XT 240 MG 24 hr capsule TAKE 1 CAPSULE BY MOUTH ONCE DAILY  . [DISCONTINUED] fenofibrate 160 MG tablet TAKE 1 TABLET(160 MG) BY MOUTH DAILY  . [DISCONTINUED] fenofibrate 160 MG tablet TAKE 1 TABLET(160 MG) BY MOUTH DAILY  . [DISCONTINUED] glipiZIDE (GLUCOTROL XL) 2.5 MG 24 hr tablet TAKE 1 TABLET(2.5 MG) BY MOUTH DAILY WITH BREAKFAST  . [DISCONTINUED] glipiZIDE (GLUCOTROL XL) 5 MG 24 hr tablet TAKE 1 TABLET(5 MG) BY MOUTH DAILY WITH BREAKFAST  . [DISCONTINUED] Iron-Vit C-Vit B12-Folic Acid (IRON 1017PLUS PO) Take 65 mg by mouth.  . [DISCONTINUED] Lancets (ONETOUCH ULTRASOFT) lancets Use as instructed  . [DISCONTINUED] losartan-hydrochlorothiazide  (HYZAAR) 100-25 MG tablet TAKE 1 TABLET BY MOUTH DAILY  . [DISCONTINUED] losartan-hydrochlorothiazide (HYZAAR) 100-25 MG tablet TAKE 1 TABLET BY MOUTH DAILY  . [DISCONTINUED] PARoxetine (PAXIL) 40 MG tablet TAKE 1 TABLET(40 MG) BY MOUTH EVERY MORNING  . [DISCONTINUED] TOVIAZ 4 MG TB24 tablet TAKE 1 TABLET(4 MG) BY MOUTH DAILY  . [DISCONTINUED] TOVIAZ 4 MG TB24 tablet TAKE 1 TABLET(4 MG) BY MOUTH DAILY  . [DISCONTINUED] TOVIAZ 4 MG TB24 tablet TAKE 1 TABLET(4 MG) BY MOUTH DAILY  . [DISCONTINUED] valACYclovir (VALTREX) 1000 MG tablet Take 1,000 mg by mouth as needed (out break).    No facility-administered encounter medications on file as of 01/02/2017.   : Review of Systems:  Out of a complete 14 point review of systems, all are reviewed and negative with the exception of these symptoms as listed below:  Review of Systems  Neurological:       Patient had a sleep study about 15 years ago. Never started on CPAP. Instead had sleep apnea surgery, T&A, and corrected deviated septum. Dx with RLS.   Patient has trouble falling asleep, sometimes staying asleep, snores, wakes up feeling tired, daytime fatigue, denies taking naps.    Epworth Sleepiness Scale 0= would never doze 1= slight chance of dozing 2= moderate chance of dozing 3= high chance of dozing  Sitting and reading:2 Watching TV:1 Sitting inactive in a public place (ex. Theater or meeting):1 As a passenger in a car for an hour without a break:2 Lying down to rest in the afternoon:3 Sitting and talking to someone:0 Sitting quietly after lunch (no alcohol):1 In a car, while stopped in traffic:1 Total:11  Objective:  Neurologic Exam  Physical Exam Physical Examination:   Vitals:   01/02/17 0856  BP: 138/68  Pulse: 80  Resp: 18   General Examination: The patient is a very pleasant 61y.o. female in no acute distress. She appears well-developed and well-nourished and well groomed.   HEENT: Normocephalic, atraumatic,  pupils are equal, round and reactive to light and accommodation. Funduscopic exam is normal with sharp disc margins noted. Extraocular tracking is good without limitation  to gaze excursion or nystagmus noted. Normal smooth pursuit is noted. Hearing is grossly intact. Tympanic membranes are clear bilaterally. Face is symmetric with normal facial animation and normal facial sensation. Speech is clear with no dysarthria noted. There is no hypophonia. There is no lip, neck/head, jaw or voice tremor. Neck is supple with full range of passive and active motion. There are no carotid bruits on auscultation. Oropharynx exam reveals: mild mouth dryness, adequate dental hygiene and mild airway crowding, due to redundant and thicker soft palate. Mallampati is class I. Tongue protrudes centrally and palate elevates symmetrically. Tonsils are absent. S/p UPPP. Neck size is 16.75 inches. She has a Mild overbite. Nasal inspection reveals no significant nasal mucosal bogginess or redness and no septal deviation, but rather small nasal anatomy.   Chest: Clear to auscultation without wheezing, rhonchi or crackles noted.  Heart: S1+S2+0, regular and normal without murmurs, rubs or gallops noted.   Abdomen: Soft, non-tender and non-distended with normal bowel sounds appreciated on auscultation.  Extremities: There is no pitting edema in the distal lower extremities bilaterally. Pedal pulses are intact.  Skin: Warm and dry without trophic changes noted.  Musculoskeletal: exam reveals no obvious joint deformities, tenderness or joint swelling or erythema, R knee slightly bigger than L, some midline LBP reported, no radiation.   Neurologically:  Mental status: The patient is awake, alert and oriented in all 4 spheres. Her immediate and remote memory, attention, language skills and fund of knowledge are appropriate. There is no evidence of aphasia, agnosia, apraxia or anomia. Speech is clear with normal prosody and  enunciation. Thought process is linear. Mood is normal and affect is normal.  Cranial nerves II - XII are as described above under HEENT exam. In addition: shoulder shrug is normal with equal shoulder height noted. Motor exam: Normal bulk, strength and tone is noted. There is no drift, tremor or rebound. Romberg is negative. Reflexes are 1-2+ throughout, absent in both knees. Fine motor skills and coordination: intact with normal finger taps, normal hand movements, normal rapid alternating patting, normal foot taps and normal foot agility.  Cerebellar testing: No dysmetria or intention tremor on finger to nose testing. Heel to shin is unremarkable bilaterally. There is no truncal or gait ataxia.  Sensory exam: intact to light touch in the upper and lower extremities.  Gait, station and balance: She stands easily. No veering to one side is noted. No leaning to one side is noted. Posture is age-appropriate and stance is narrow based. Gait shows normal stride length and normal pace. No problems turning are noted. Tandem walk is difficult for her.   Assessment and Plan:  In summary, Quintana P Ector is a very pleasant 61 y.o.-year old female with an underlying medical history of reflux disease, depression, chronic bronchitis, anxiety, arthritis, s/p b/l TKAs in 2015 and 2016, hyperlipidemia, hypertension, restless leg syndrome, type 2 diabetes, and morbid obesity with a BMI of over 40, whose history and physical exam are concerning for obstructive sleep apnea (OSA). She has a longer standing history of restless leg syndrome, is currently on Requip for this. We will be on the look out for PLMS during her sleep study. I had a long chat with the patient about my findings and the diagnosis of OSA, its prognosis and treatment options. We talked about medical treatments, surgical interventions and non-pharmacological approaches. I explained in particular the risks and ramifications of untreated moderate to severe OSA,  especially with respect to developing cardiovascular disease down the  Road, including congestive heart failure, difficult to treat hypertension, cardiac arrhythmias, or stroke. Even type 2 diabetes has, in part, been linked to untreated OSA. Symptoms of untreated OSA include daytime sleepiness, memory problems, mood irritability and mood disorder such as depression and anxiety, lack of energy, as well as recurrent headaches, especially morning headaches. We talked about trying to maintain a healthy lifestyle in general, as well as the importance of weight control. I encouraged the patient to eat healthy, exercise daily and keep well hydrated, to keep a scheduled bedtime and wake time routine, to not skip any meals and eat healthy snacks in between meals. I advised the patient not to drive when feeling sleepy. I recommended the following at this time: sleep study with potential positive airway pressure titration. (We will score hypopneas at 4%).   I explained the sleep test procedure to the patient and also outlined possible surgical and non-surgical treatment options of OSA, including the use of a custom-made dental device (which would require a referral to a specialist dentist or oral surgeon), upper airway surgical options, such as pillar implants, radiofrequency surgery, tongue base surgery, and UPPP (which would involve a referral to an ENT surgeon). Rarely, jaw surgery such as mandibular advancement may be considered.  I also explained the CPAP treatment option to the patient, who indicated that she would be willing to try CPAP if the need arises. I explained the importance of being compliant with PAP treatment, not only for insurance purposes but primarily to improve Her symptoms, and for the patient's long term health benefit, including to reduce Her cardiovascular risks. I answered all her questions today and the patient was in agreement. I would like to see her back after the sleep study is completed  and encouraged her to call with any interim questions, concerns, problems or updates.   Thank you very much for allowing me to participate in the care of this nice patient. If I can be of any further assistance to you please do not hesitate to call me at [phone].  Sincerely,   Star Age, MD, PhD

## 2017-01-02 NOTE — Telephone Encounter (Signed)
Left message to call [place] at [phone].  Patient needs follow up breast check with Dr.Silva.

## 2017-01-02 NOTE — Patient Instructions (Signed)
Based on your symptoms and your exam I believe you are still at risk for obstructive sleep apnea or OSA, and I think we should proceed with a sleep study to determine whether you do or do not have OSA and how severe it is. If you have more than mild OSA, I want you to consider treatment with CPAP. Please remember, the risks and ramifications of moderate to severe obstructive sleep apnea or OSA are: Cardiovascular disease, including congestive heart failure, stroke, difficult to control hypertension, arrhythmias, and even type 2 diabetes has been linked to untreated OSA. Sleep apnea causes disruption of sleep and sleep deprivation in most cases, which, in turn, can cause recurrent headaches, problems with memory, mood, concentration, focus, and vigilance. Most people with untreated sleep apnea report excessive daytime sleepiness, which can affect their ability to drive. Please do not drive if you feel sleepy.   I will likely see you back after your sleep study to go over the test results and where to go from there. We will call you after your sleep study to advise about the results (most likely, you will hear from Diana, my nurse) and to set up an appointment at the time, as necessary.    Our sleep lab administrative assistant, Dawn will meet with you or call you to schedule your sleep study. If you don't hear back from her by next week please feel free to call her at [phone]. This is her direct line and please leave a message with your phone number to call back if you get the voicemail box. She will call back as soon as possible.   

## 2017-01-03 NOTE — Telephone Encounter (Signed)
Please confirm that we have received all of the patient's breast imaging results.

## 2017-01-03 NOTE — Telephone Encounter (Signed)
Patient is returning a call to Kaitlyn. °

## 2017-01-03 NOTE — Telephone Encounter (Signed)
Spoke with patient. Patient states she is unavailable until after 01/26/17. Patient scheduled for breast recheck on 01/29/17 at 12:45pm with Dr. Quincy Simmonds. Patient verbalizes understanding and is agreeable.  Routing to provider for final review. Patient is agreeable to disposition. Will close encounter.

## 2017-01-04 NOTE — Telephone Encounter (Signed)
Dr. Quincy Simmonds, we have received all imaging results from [facility], diagnostic bilateral MMG and left breast ultrasound dated 11/30/16, results to scan.   Routing to provider for final review. Will close encounter.

## 2017-01-10 ENCOUNTER — Encounter: Payer: Self-pay | Admitting: Obstetrics and Gynecology

## 2017-01-16 ENCOUNTER — Telehealth: Payer: Self-pay | Admitting: Neurology

## 2017-01-16 DIAGNOSIS — G4761 Periodic limb movement disorder: Secondary | ICD-10-CM

## 2017-01-16 DIAGNOSIS — G4733 Obstructive sleep apnea (adult) (pediatric): Secondary | ICD-10-CM

## 2017-01-16 NOTE — Telephone Encounter (Signed)
[employer] denied Split sleep study but approved HST.  Can I get an order for HST?

## 2017-01-16 NOTE — Telephone Encounter (Signed)
HST order placed. 

## 2017-01-16 NOTE — Addendum Note (Signed)
Addended by: Lester [place] A on: 01/16/2017 04:29 PM   Modules accepted: Orders

## 2017-01-26 NOTE — Progress Notes (Signed)
GYNECOLOGY  VISIT   HPI: 61 y.o.   Widowed  Caucasian  female   G2P2002 with No LMP recorded. Patient has had a hysterectomy.   here for  Breast recheck.  Seen for annual exam on 11/22/16 and through she had a left breast lump. On exam noted to have 1 cm mass versus fibroglandular tissue at 6:00. Bilateral dx mammogram and left breast ultrasound negative and normal at [facility]   Patient does not feel a breast lump. No pain in left breast.  No nipple drainage. Not on HRT.   FH of breast CA mother and maternal GM with breast cancer.  Paternal aunt and paternal GM with breast cancer.   GYNECOLOGIC HISTORY: No LMP recorded. Patient has had a hysterectomy. Contraception: Hysterectomy -- ovaries remain Menopausal hormone therapy:  none Last mammogram: [place] Left Breast 11/30/16 BIRADS 1 negative -- See EPIC Last pap smear:   2000 Neg        OB History    Gravida Para Term Preterm AB Living   2 2 2     2    SAB TAB Ectopic Multiple Live Births                     Patient Active Problem List   Diagnosis Date Noted  . De Quervain's tenosynovitis, right 03/01/2016  . Overactive bladder 01/24/2016  . Occipital headache 12/08/2015  . Visual disturbance of one eye 12/08/2015  . Severe obesity (BMI >= 40) ([place]) 07/05/2015  . Cough 12/07/2014  . Anxiety and depression 11/18/2014  . S/P left total knee replacement   . Right knee DJD   . Primary localized osteoarthritis of right knee   . DJD (degenerative joint disease) of knee 08/11/2013  . Diabetes type 2, uncontrolled ([place])   . Hypertension   . Restless leg syndrome   . GERD (gastroesophageal reflux disease)   . High triglycerides   . Hyperlipidemia   . Sleep apnea   . Left knee DJD     Past Medical History:  Diagnosis Date  . Anxiety   . Arthritis    "was in my knees before OR; now in my hands" (05/05/2014)  . Bronchitis   . Chronic bronchitis ([place])    "used to get it q yr; not as much since septal repair in 2004"  .  Depression   . GERD (gastroesophageal reflux disease)   . High triglycerides   . HSV infection   . Hyperlipidemia   . Hypertension   . Left knee DJD   . Obesity   . Primary localized osteoarthritis of right knee   . Restless leg syndrome   . Sleep apnea    mild; does not wear CPAP  . Type II diabetes mellitus ([place])   . Urgency of urination    and frequency takes Vesicare  . Urinary incontinence   . Walking pneumonia ~ 2013    Past Surgical History:  Procedure Laterality Date  . COLONOSCOPY    . HYSTEROSCOPY  11/94   submuous myomas  . KNEE ARTHROSCOPY Right ~ 2010  . KNEE ARTHROSCOPY W/ MENISCECTOMY Right 2010  . NASAL SEPTUM SURGERY    . REFRACTIVE SURGERY Bilateral ~ 2005  . TONSILLECTOMY  ~ 2004  . TOTAL KNEE ARTHROPLASTY Left 08/11/2013   Procedure: LEFT TOTAL KNEE ARTHROPLASTY;  Surgeon: Lorn Junes, MD;  Location: [place];  Service: Orthopedics;  Laterality: Left;  . TOTAL KNEE ARTHROPLASTY Right 05/04/2014   Procedure: RIGHT TOTAL KNEE  ARTHROPLASTY;  Surgeon: Lorn Junes, MD;  Location: [place];  Service: Orthopedics;  Laterality: Right;  . UVULOPALATOPHARYNGOPLASTY, TONSILLECTOMY AND SEPTOPLASTY  2004  . VAGINAL HYSTERECTOMY  06/1993    Current Outpatient Prescriptions  Medication Sig Dispense Refill  . aspirin 81 MG tablet Take 81 mg by mouth daily.    Marland Kitchen CARTIA XT 240 MG 24 hr capsule TAKE 1 CAPSULE BY MOUTH EVERY DAY 90 capsule 0  . Exenatide ER (BYDUREON BCISE) 2 MG/0.85ML AUIJ Inject 2 mg into the skin once a week. 4 pen 2  . fenofibrate 160 MG tablet TAKE 1 TABLET(160 MG) BY MOUTH DAILY 30 tablet 0  . fesoterodine (TOVIAZ) 8 MG TB24 tablet Take 1 tablet (8 mg total) by mouth daily. 90 tablet 1  . glipiZIDE (GLUCOTROL XL) 2.5 MG 24 hr tablet TAKE 1 TABLET(2.5 MG) BY MOUTH DAILY WITH BREAKFAST 90 tablet 1  . glipiZIDE (GLUCOTROL XL) 5 MG 24 hr tablet TAKE 1 TABLET(5 MG) BY MOUTH DAILY WITH BREAKFAST 90 tablet 1  . glucose blood (ONE TOUCH ULTRA TEST) test  strip Use as instructed 100 each 12  . losartan-hydrochlorothiazide (HYZAAR) 100-25 MG tablet TAKE 1 TABLET BY MOUTH DAILY 30 tablet 0  . metFORMIN (GLUCOPHAGE-XR) 500 MG 24 hr tablet TAKE 2 TABLETS BY MOUTH DAILY WITH BREAKFAST 120 tablet 1  . Misc Natural Products (OSTEO BI-FLEX ADV JOINT SHIELD) TABS     . Multiple Vitamin (MULTIVITAMIN WITH MINERALS) TABS tablet Take 1 tablet by mouth daily.    Marland Kitchen omeprazole (PRILOSEC) 10 MG capsule TAKE ONE CAPSULE BY MOUTH TWICE DAILY, 30 MINUTES BEFORE BREAKFAST AND 30 MINUTES BEFORE THE EVENING MEAL FOR 8 WEEKS 180 capsule 1  . PARoxetine (PAXIL) 40 MG tablet TAKE 1 TABLET(40 MG) BY MOUTH EVERY MORNING 90 tablet 1  . rOPINIRole (REQUIP) 1 MG tablet Take 1 tablet (1 mg total) by mouth at bedtime. 90 tablet 1  . TURMERIC PO Take 1 tablet by mouth daily. Takes 300mg  daily     No current facility-administered medications for this visit.      ALLERGIES: Celebrex [celecoxib]; Lisinopril; Meloxicam; Nsaids; Penicillins; Statins; and Lidocaine  Family History  Problem Relation Age of Onset  . Heart disease Mother   . Hypertension Mother   . Stroke Mother   . Breast cancer Mother   . Diabetes Mellitus II Father   . Cancer Father        CLL  . Hypertension Brother   . Breast cancer Paternal Grandmother   . Breast cancer Maternal Grandmother     Social History   Social History  . Marital status: Widowed    Spouse name: N/A  . Number of children: 2  . Years of education: 16   Occupational History  . Intake Coordinator    Social History Main Topics  . Smoking status: Never Smoker  . Smokeless tobacco: Never Used  . Alcohol use 0.6 oz/week    1 Standard drinks or equivalent per week     Comment: 05/05/2014 "might have a drink once/month"  . Drug use: No  . Sexual activity: Not Currently    Partners: Male    Birth control/ protection: Surgical     Comment: [facility] 1994   Other Topics Concern  . Not on file   Social History Narrative    Currently single. Fun: Crafts, flea markets, antiquing.    Denies religious beliefs that effect health care.    Caffeine: 2-3 drinks a day  ROS:  Pertinent items are noted in HPI.  PHYSICAL EXAMINATION:    BP 114/62 (BP Location: Right Arm, Patient Position: Sitting, Cuff Size: Normal)   Pulse 84   Resp 16   Wt 241 lb (109.3 kg)   BMI 41.69 kg/m     General appearance: alert, cooperative and appears stated age   Breasts: normal appearance, no masses or tenderness, No nipple retraction or dimpling, No nipple discharge or bleeding, No axillary or supraclavicular adenopathy   Chaperone was present for exam.  ASSESSMENT  Family history of breast cancer maternal and paternal sides of family.  Normal breast exam today.    PLAN  Screening mammogram in April 2018.  We discussed genetic mutations that increase risk of breast and other cancers.  Referral to genetics specialist for counseling and potential testing.  She may qualify for yearly breast MRI at a minimum.    An After Visit Summary was printed and given to the patient.  __25____ minutes face to face time of which over 50% was spent in counseling.

## 2017-01-29 ENCOUNTER — Ambulatory Visit (INDEPENDENT_AMBULATORY_CARE_PROVIDER_SITE_OTHER): Payer: 59 | Admitting: Obstetrics and Gynecology

## 2017-01-29 ENCOUNTER — Encounter: Payer: Self-pay | Admitting: Obstetrics and Gynecology

## 2017-01-29 VITALS — BP 114/62 | HR 84 | Resp 16 | Wt 241.0 lb

## 2017-01-29 DIAGNOSIS — Z803 Family history of malignant neoplasm of breast: Secondary | ICD-10-CM

## 2017-01-29 NOTE — Patient Instructions (Signed)
BRCA Gene Testing Why am I having this test? BRCA gene testing is done to check for the presence of harmful changes (mutations) in the BRCA1 gene or the BRCA2 gene (breast cancer susceptibility genes). If there is a mutation, the genes may not be able to help repair damaged cells in the body. As a result, the damaged cells may develop defects that can lead to certain types of cancer. You may have this test if you have a family history of certain types of cancer, including cancer of the:  Breast.  Ovaries.  Fallopian tubes.  Peritoneum.  Pancreas.  Prostate.  What kind of sample is taken? The test requires either a sample of blood or a sample of cells from your saliva. If a sample of blood is needed, it will probably be collected by inserting a needle into a vein. If a sample of saliva is needed, you will get instructions about how to collect the sample. What do the results mean? The test results can show whether you have a mutation in the BRCA1 or BRCA2 gene that increases your risk for certain cancers. Meaning of negative test results A negative test result means that you do not have a mutation in the BRCA1 or BRCA2 gene that is known to increase your risk for certain cancers. This does not mean that you will never get cancer. Talk with your health care provider or a genetic counselor about what this result means for you. Meaning of positive test results A positive test result means that you have a mutation in the BRCA1 or BRCA2 gene that increases your risk for certain cancers. Women with a positive test result have an increased risk for breast and ovarian cancer. Both women and men with a mutation have an increased risk for breast cancer and may be at greater risk for other types of cancer. Getting a positive test result does not mean that you will develop cancer. Talk with your health care provider or a genetic counselor about what this result means for you. You may be told that you are  a carrier. This means that you can pass the mutation to your children. Meaning of ambiguous test results Ambiguous, inconclusive, or uncertain test results mean that there is a change in the BRCA1 or BRCA2 gene, but it is a change that has not been linked to cancer. Talk with your health care provider or a genetic counselor about what this result means for you. Talk with your health care provider to discuss your results, treatment options, and if necessary, the need for more tests. Talk with your health care provider if you have any questions about your results. How do I get my results? It is up to you to get your test results. Ask your health care provider, or the department that is doing the test, when your results will be ready. This information is not intended to replace advice given to you by your health care provider. Make sure you discuss any questions you have with your health care provider. Document Released: 08/24/2004 Document Revised: 04/03/2016 Document Reviewed: 03/22/2016 Elsevier Interactive Patient Education  2018 Elsevier Inc.  

## 2017-02-07 ENCOUNTER — Ambulatory Visit (INDEPENDENT_AMBULATORY_CARE_PROVIDER_SITE_OTHER): Payer: 59 | Admitting: Neurology

## 2017-02-07 DIAGNOSIS — G4733 Obstructive sleep apnea (adult) (pediatric): Secondary | ICD-10-CM | POA: Diagnosis not present

## 2017-02-07 DIAGNOSIS — G4734 Idiopathic sleep related nonobstructive alveolar hypoventilation: Secondary | ICD-10-CM

## 2017-02-13 NOTE — Procedures (Signed)
  [facility]  [facility] [address] [place], [place] [address] NAME: Diane Gregory DOB: 04/16/1956 MEDICAL RECORD [id number] DOS:02/05/17 REFERRING PHYSICIAN: Mauricio Po, FNP STUDY PERFORMED: HST HISTORY: 61 year old woman with a history of reflux disease, depression, chronic bronchitis, anxiety, arthritis, s/p b/l TKAs in 2015 and 2016, hyperlipidemia, hypertension, restless leg syndrome, type 2 diabetes, and morbid obesity with a BMI of over 40, who reports snoring and excessive daytime somnolence. Her Epworth sleepiness score is 11 out of 24. STUDY RESULTS: Total Recording Time: 8h 93m, duration: 7 h, 14 min Total Apnea/Hypopnea Index (AHI): 26.3/hour Average Oxygen Saturation: 87% Lowest Oxygen Saturation: 64% Average Heart Rate: 71 bpm IMPRESSION: OSA (obstructive sleep apnea) RECOMMENDATION: This home sleep test demonstrates overall moderate obstructive sleep apnea with a total AHI of 26.3/hour and O2 nadir of 64%. Treatment with positive airway pressure (in the form of CPAP) is recommended. This will require a full night CPAP titration study for proper treatment settings and mask fitting. The patient should be cautioned not to drive, work at heights, or operate dangerous or heavy equipment when tired or sleepy. Review and reiteration of good sleep hygiene measures should be pursued with any patient. Please note that untreated obstructive sleep apnea carries additional perioperative morbidity. Patients with significant obstructive sleep apnea should receive perioperative PAP therapy and the surgeons and particularly the anesthesiologist should be informed of the diagnosis and the severity of the sleep disordered breathing. The patient and his referring provider will be notified of the test results. The patient will be seen in follow up in sleep clinic at [facility].  I certify that I have reviewed the raw data recording prior to the issuance of this report in  accordance with the standards of the American Academy of Sleep Medicine (AASM). Star Age, MD, PhD [facility]) Diplomat, ABPN (neurology and sleep)

## 2017-02-13 NOTE — Addendum Note (Signed)
Addended by: Star Age on: 02/13/2017 07:23 PM   Modules accepted: Orders

## 2017-02-13 NOTE — Progress Notes (Signed)
Patient referred by Terri Piedra, seen by me on 01/02/17, HST on 02/07/17:  Please call and notify the patient that the recent home sleep test did suggest the diagnosis of moderate obstructive sleep apnea and that I recommend treatment for this in the form of CPAP. I will request an overnight sleep study for proper titration and mask fitting. Please explain to patient and arrange for a CPAP titration study. I have placed an order in the chart. Thanks, and please route to [facility] for scheduling.   Star Age, MD, PhD [facility])

## 2017-02-15 ENCOUNTER — Telehealth: Payer: Self-pay | Admitting: Neurology

## 2017-02-15 DIAGNOSIS — G4733 Obstructive sleep apnea (adult) (pediatric): Secondary | ICD-10-CM

## 2017-02-15 NOTE — Telephone Encounter (Signed)
I called pt. I advised pt that Dr. Rexene Alberts reviewed their sleep study results and found that pt has moderate osa. Dr. Rexene Alberts recommends that pt start an auto pap at home, since her insurance denied the in-lab titration study. I reviewed PAP compliance expectations with the pt. Pt is agreeable to starting a PAP. I advised pt that an order will be sent to a DME, [facility], and [facility] will call the pt within about one week after they file with the pt's insurance. [facility] will show the pt how to use the machine, fit for masks, and troubleshoot the PAP if needed. A follow up appt was made for insurance purposes with Dr. Rexene Alberts on September 11th, 2018 at 1:00pm. Pt verbalized understanding to arrive 15 minutes early and bring their PAP. A letter with all of this information in it will be mailed to the pt as a reminder. I verified with the pt that the address we have on file is correct. Pt verbalized understanding of results. Pt had no questions at this time but was encouraged to call back if questions arise.

## 2017-02-15 NOTE — Telephone Encounter (Signed)
[employer] denied CPAP suggested autopap

## 2017-02-15 NOTE — Telephone Encounter (Signed)
Received this result note from Dr. Rexene Alberts: "Patient referred by Terri Piedra, seen by me on 01/02/17, HST on 02/07/17:  Please call and notify the patient that the recent home sleep test did suggest the diagnosis of moderate obstructive sleep apnea and that I recommend treatment for this in the form of CPAP. I will request an overnight sleep study for proper titration and mask fitting. Please explain to patient and arrange for a CPAP titration study. I have placed an order in the chart. Thanks, and please route to [facility] for scheduling".

## 2017-02-15 NOTE — Telephone Encounter (Signed)
We will set patient up with autoPAP at home, as insurance denied in house titration study for OSA. Pls process order and notify patient and set up FU in 10 weeks with me or NP Jinny Blossom or Hoyle Sauer.

## 2017-02-27 ENCOUNTER — Encounter: Payer: Self-pay | Admitting: Family

## 2017-03-04 ENCOUNTER — Other Ambulatory Visit: Payer: Self-pay | Admitting: Family

## 2017-03-05 NOTE — Telephone Encounter (Signed)
I feel like it is okay to refill Bydureon, but not sure about Cartia because patient has not been seen for 18 months for that reason. Please advise thanks

## 2017-03-06 ENCOUNTER — Encounter: Payer: Self-pay | Admitting: Family

## 2017-03-06 ENCOUNTER — Other Ambulatory Visit: Payer: Self-pay | Admitting: Family

## 2017-03-20 ENCOUNTER — Ambulatory Visit (INDEPENDENT_AMBULATORY_CARE_PROVIDER_SITE_OTHER): Payer: 59 | Admitting: Family

## 2017-03-20 ENCOUNTER — Encounter: Payer: Self-pay | Admitting: Family

## 2017-03-20 VITALS — BP 122/80 | HR 81 | Temp 98.3°F | Resp 16 | Ht 63.75 in | Wt 242.0 lb

## 2017-03-20 DIAGNOSIS — IMO0001 Reserved for inherently not codable concepts without codable children: Secondary | ICD-10-CM

## 2017-03-20 DIAGNOSIS — E1165 Type 2 diabetes mellitus with hyperglycemia: Secondary | ICD-10-CM

## 2017-03-20 LAB — POCT GLYCOSYLATED HEMOGLOBIN (HGB A1C): Hemoglobin A1C: 7.4

## 2017-03-20 MED ORDER — GLUCOSE BLOOD VI STRP: ORAL_STRIP | 12 refills | Status: DC

## 2017-03-20 MED ORDER — ONETOUCH SURESOFT LANCING DEV MISC
1 refills | Status: DC
Start: 2017-03-20 — End: 2019-04-02

## 2017-03-20 NOTE — Patient Instructions (Signed)
Thank you for choosing [facility].  SUMMARY AND INSTRUCTIONS:  Your A1c is 7.4.  Please continue to take your medications as prescribed.  Work on decreasing carbohydrate intake and consider a Ketogenic diet   Follow up in 3 months or sooner.  Continue to monitor your blood sugars at home - a new meter was sent to the pharmacy.  Labs:  Please stop by the lab on the lower level of the building for your blood work. Your results will be released to [facility] (or called to you) after review, usually within 72 hours after test completion. If any changes need to be made, you will be notified at that same time.  1.) The lab is open from 7:30am to 5:30 pm Monday-Friday 2.) No appointment is necessary 3.) Fasting (if needed) is 6-8 hours after food and drink; black coffee and water are okay   Follow up:  If your symptoms worsen or fail to improve, please contact our office for further instruction, or in case of emergency go directly to the emergency room at the closest medical facility.

## 2017-03-20 NOTE — Progress Notes (Addendum)
Subjective:    Patient ID: Diane Gregory, female    DOB: Sep 29, 1955, 61 y.o.   MRN: [id number]  Chief Complaint  Patient presents with  . Follow-up    been on bydureon for 3+ months, checks blood sugar periodically and states that the sugars aren't improving    HPI:  Diane Gregory is a 61 y.o. female who  has a past medical history of Anxiety; Arthritis; Bronchitis; Chronic bronchitis ([place]); Depression; GERD (gastroesophageal reflux disease); High triglycerides; HSV infection; Hyperlipidemia; Hypertension; Left knee DJD; Obesity; Primary localized osteoarthritis of right knee; Restless leg syndrome; Sleep apnea; Type II diabetes mellitus ([place]); Urgency of urination; Urinary incontinence; and Walking pneumonia (~ 2013). and presents today for a follow up office visit.  Diabetes - Currently maintained on metformin, glipizide and Bydureon. Reports taking the medication as prescribed and denies adverse side effects. Blood sugars at home have been remained elevated around 300. Blood sugars will generally vary throughout the day. Endorses eating a lot of fast food averaging about 1 per day. Breakfast is usually two eggs and [place] sausage. Does cook sometimes. Generally will eat a chicken sandwich or hamburger most days.                                                                                                                                                                                                                                        Lab Results  Component Value Date   HGBA1C 7.4 03/20/2017                                                                                                       Allergies  Allergen Reactions  . Celebrex [Celecoxib] Swelling  . Lisinopril Cough  . Meloxicam Swelling  . Nsaids Swelling  . Penicillins Other (See Comments)    Drop in blood pressure  . Statins Other (See Comments)    edema  . Lidocaine Rash    "bad taste in mouth"  with dental work       Outpatient Medications Prior to Visit  Medication Sig Dispense Refill  . aspirin 81 MG tablet Take 81 mg by mouth daily.    Marland Kitchen BYDUREON BCISE 2 MG/0.85ML AUIJ INJECT 2 MG INTO THE SKIN ONCE A WEEK 4 pen 0  . CARTIA XT 240 MG 24 hr capsule TAKE 1 CAPSULE BY MOUTH EVERY DAY 90 capsule 0  . fenofibrate 160 MG tablet TAKE 1 TABLET(160 MG) BY MOUTH DAILY 30 tablet 0  . fesoterodine (TOVIAZ) 8 MG TB24 tablet Take 1 tablet (8 mg total) by mouth daily. 90 tablet 1  . glipiZIDE (GLUCOTROL XL) 2.5 MG 24 hr tablet TAKE 1 TABLET(2.5 MG) BY MOUTH DAILY WITH BREAKFAST 90 tablet 1  . glipiZIDE (GLUCOTROL XL) 5 MG 24 hr tablet TAKE 1 TABLET(5 MG) BY MOUTH DAILY WITH BREAKFAST 90 tablet 1  . losartan-hydrochlorothiazide (HYZAAR) 100-25 MG tablet TAKE 1 TABLET BY MOUTH DAILY 30 tablet 0  . metFORMIN (GLUCOPHAGE-XR) 500 MG 24 hr tablet TAKE 2 TABLETS BY MOUTH DAILY WITH BREAKFAST 120 tablet 1  . Misc Natural Products (OSTEO BI-FLEX ADV JOINT SHIELD) TABS     . Multiple Vitamin (MULTIVITAMIN WITH MINERALS) TABS tablet Take 1 tablet by mouth daily.    Marland Kitchen omeprazole (PRILOSEC) 10 MG capsule TAKE ONE CAPSULE BY MOUTH TWICE DAILY, 30 MINUTES BEFORE BREAKFAST AND 30 MINUTES BEFORE THE EVENING MEAL FOR 8 WEEKS 180 capsule 1  . PARoxetine (PAXIL) 40 MG tablet TAKE 1 TABLET(40 MG) BY MOUTH EVERY MORNING 90 tablet 1  . rOPINIRole (REQUIP) 1 MG tablet TAKE 1 TABLET(1 MG) BY MOUTH AT BEDTIME 90 tablet 0  . TURMERIC PO Take 1 tablet by mouth daily. Takes 300mg  daily    . glucose blood (ONE TOUCH ULTRA TEST) test strip Use as instructed 100 each 12   No facility-administered medications prior to visit.      Past Medical History:  Diagnosis Date  . Anxiety   . Arthritis    "was in my knees before OR; now in my hands" (05/05/2014)  . Bronchitis   . Chronic bronchitis ([place])    "used to get it q yr; not as much since septal repair in 2004"  . Depression   . GERD (gastroesophageal reflux disease)   . High  triglycerides   . HSV infection   . Hyperlipidemia   . Hypertension   . Left knee DJD   . Obesity   . Primary localized osteoarthritis of right knee   . Restless leg syndrome   . Sleep apnea    mild; does not wear CPAP  . Type II diabetes mellitus ([place])   . Urgency of urination    and frequency takes Vesicare  . Urinary incontinence   . Walking pneumonia ~ 2013      Review of Systems  Constitutional: Negative for chills, fatigue and fever.  Eyes:       Denies changes in vision  Respiratory: Negative for chest tightness and shortness of breath.   Cardiovascular: Negative for chest pain, palpitations and leg swelling.  Endocrine: Negative for polydipsia, polyphagia and polyuria.  Neurological: Negative for numbness.      Objective:    BP 122/80 (BP Location: Left Arm, Patient Position: Sitting, Cuff Size: Large)   Pulse 81   Temp 98.3 F (36.8 C) (Oral)   Resp 16   Ht 5' 3.75" (1.619 m)   Wt 242 lb (109.8 kg)   SpO2 96%   BMI  41.87 kg/m  Nursing note and vital signs reviewed.  Physical Exam  Constitutional: She is oriented to person, place, and time. She appears well-developed and well-nourished. No distress.  Cardiovascular: Normal rate, regular rhythm, normal heart sounds and intact distal pulses.   Pulmonary/Chest: Effort normal and breath sounds normal.  Neurological: She is alert and oriented to person, place, and time.  Skin: Skin is warm and dry.  Psychiatric: She has a normal mood and affect. Her behavior is normal. Judgment and thought content normal.       Assessment & Plan:   Problem List Items Addressed This Visit      Endocrine   Diabetes type 2, uncontrolled ([place]) - Primary    In office A1c of 7.4 increased from 7.2 with home blood sugars indicating hyperglycemia with readings of 300 on average. Advised to obtain new glucose meter as her current meter is several years old and has not been calibrated. Continue current dosage of metformin,  glipizide, and Bydureon. Encouraged to complete diabetic eye exam independently. Follow up in 3 months or sooner if needed.       Relevant Medications   glucose blood (ONE TOUCH ULTRA TEST) test strip   Other Relevant Orders   POCT HgB A1C (Completed)       I am having Ms. Elizarraraz start on [place]. I am also having her maintain her multivitamin with minerals, aspirin, TURMERIC PO, losartan-hydrochlorothiazide, fenofibrate, omeprazole, OSTEO BI-FLEX ADV JOINT SHIELD, metFORMIN, fesoterodine, glipiZIDE, PARoxetine, glipiZIDE, BYDUREON BCISE, CARTIA XT, rOPINIRole, and glucose blood.   Meds ordered this encounter  Medications  . Lancets Misc. (ONE TOUCH [place]) MISC    Sig: Use 1 lancet per test. Test blood sugars 1-4 times per day as instructed.    Dispense:  1 each    Refill:  1    Substitution permissible per insurance coverage. Dx E11.9.    Order Specific Question:   Supervising Provider    Answer:   Pricilla Holm A [[id number]]  . glucose blood (ONE TOUCH ULTRA TEST) test strip    Sig: Use as instructed    Dispense:  100 each    Refill:  12    OneTouch Ultra Mini preferred by patient with substitution permissible by insurance coverage. Dx: E11.65    Order Specific Question:   Supervising Provider    Answer:   Pricilla Holm A [[id number]]     Follow-up: Return in about 3 months (around 06/20/2017), or if symptoms worsen or fail to improve.  Mauricio Po, FNP

## 2017-03-20 NOTE — Assessment & Plan Note (Signed)
In office A1c of 7.4 increased from 7.2 with home blood sugars indicating hyperglycemia with readings of 300 on average. Advised to obtain new glucose meter as her current meter is several years old and has not been calibrated. Continue current dosage of metformin, glipizide, and Bydureon. Encouraged to complete diabetic eye exam independently. Follow up in 3 months or sooner if needed.

## 2017-03-20 NOTE — Addendum Note (Signed)
Addended by: Mauricio Po D on: 03/20/2017 09:18 PM   Modules accepted: Orders

## 2017-03-22 ENCOUNTER — Other Ambulatory Visit: Payer: Self-pay

## 2017-03-22 MED ORDER — BLOOD GLUCOSE MONITOR KIT: PACK | 0 refills | Status: DC

## 2017-04-02 ENCOUNTER — Other Ambulatory Visit: Payer: Self-pay | Admitting: Family

## 2017-04-20 ENCOUNTER — Ambulatory Visit (INDEPENDENT_AMBULATORY_CARE_PROVIDER_SITE_OTHER): Payer: 59 | Admitting: Family

## 2017-04-20 ENCOUNTER — Encounter: Payer: Self-pay | Admitting: Family

## 2017-04-20 VITALS — BP 118/60 | HR 87 | Temp 98.7°F | Resp 16 | Ht 63.75 in | Wt 239.0 lb

## 2017-04-20 DIAGNOSIS — J069 Acute upper respiratory infection, unspecified: Secondary | ICD-10-CM | POA: Diagnosis not present

## 2017-04-20 MED ORDER — FLUTICASONE FUROATE-VILANTEROL 100-25 MCG/INH IN AEPB: 1.0000 | INHALATION_SPRAY | Freq: Every day | RESPIRATORY_TRACT | 0 refills | Status: DC

## 2017-04-20 MED ORDER — AZITHROMYCIN 250 MG PO TABS: ORAL_TABLET | ORAL | 0 refills | Status: DC

## 2017-04-20 NOTE — Assessment & Plan Note (Signed)
New-onset coughing with symptoms and exam concerning for acute upper respiratory infection or early bronchitis. Start azithromycin. Sample of Breo provided to help with cough and wheezing. Continue over-the-counter medications as needed for symptom relief and supportive care.

## 2017-04-20 NOTE — Progress Notes (Signed)
Subjective:    Patient ID: Diane Gregory, female    DOB: 03/07/1956, 61 y.o.   MRN: [id number]  Chief Complaint  Patient presents with  . Cough    x2 weeks that got worse last night, no other sxs     HPI:  Diane Gregory is a 61 y.o. female who  has a past medical history of Anxiety; Arthritis; Bronchitis; Chronic bronchitis ([place]); Depression; GERD (gastroesophageal reflux disease); High triglycerides; HSV infection; Hyperlipidemia; Hypertension; Left knee DJD; Obesity; Primary localized osteoarthritis of right knee; Restless leg syndrome; Sleep apnea; Type II diabetes mellitus ([place]); Urgency of urination; Urinary incontinence; and Walking pneumonia (~ 2013). and presents today for an acute office visit.   This is a new problem. Associated symptom of cough has been going on for about two weeks and has worsened in the past 48 hours. No fevers or other cold/flu like symptoms. Does have some pressure in her chest. Cough is non-productive and worsened with activity. Modifying factors include Mucinex-D which helped a little with her symptoms and no changes with OTC antihistamines.   Allergies  Allergen Reactions  . Celebrex [Celecoxib] Swelling  . Lisinopril Cough  . Meloxicam Swelling  . Nsaids Swelling  . Penicillins Other (See Comments)    Drop in blood pressure  . Statins Other (See Comments)    edema  . Lidocaine Rash    "bad taste in mouth" with dental work      Outpatient Medications Prior to Visit  Medication Sig Dispense Refill  . aspirin 81 MG tablet Take 81 mg by mouth daily.    . blood glucose meter kit and supplies KIT Dispense based on patient and insurance preference. Use up to four times daily as directed. Dx code: E11.65 1 each 0  . BYDUREON BCISE 2 MG/0.85ML AUIJ INJECT 2 MG UNDER THE SKIN ONCE A WEEK 4 pen 2  . CARTIA XT 240 MG 24 hr capsule TAKE 1 CAPSULE BY MOUTH EVERY DAY 90 capsule 0  . fenofibrate 160 MG tablet TAKE 1 TABLET(160 MG) BY MOUTH DAILY 30 tablet 0    . fesoterodine (TOVIAZ) 8 MG TB24 tablet Take 1 tablet (8 mg total) by mouth daily. 90 tablet 1  . glipiZIDE (GLUCOTROL XL) 2.5 MG 24 hr tablet TAKE 1 TABLET(2.5 MG) BY MOUTH DAILY WITH BREAKFAST 90 tablet 1  . glipiZIDE (GLUCOTROL XL) 5 MG 24 hr tablet TAKE 1 TABLET(5 MG) BY MOUTH DAILY WITH BREAKFAST 90 tablet 1  . glucose blood (ONE TOUCH ULTRA TEST) test strip Use as instructed 100 each 12  . Lancets Misc. (ONE TOUCH SURESOFT) MISC Use 1 lancet per test. Test blood sugars 1-4 times per day as instructed. 1 each 1  . losartan-hydrochlorothiazide (HYZAAR) 100-25 MG tablet TAKE 1 TABLET BY MOUTH DAILY 30 tablet 0  . metFORMIN (GLUCOPHAGE-XR) 500 MG 24 hr tablet TAKE 2 TABLETS BY MOUTH DAILY WITH BREAKFAST 180 tablet 0  . Misc Natural Products (OSTEO BI-FLEX ADV JOINT SHIELD) TABS     . Multiple Vitamin (MULTIVITAMIN WITH MINERALS) TABS tablet Take 1 tablet by mouth daily.    Marland Kitchen omeprazole (PRILOSEC) 10 MG capsule TAKE ONE CAPSULE BY MOUTH TWICE DAILY, 30 MINUTES BEFORE BREAKFAST AND 30 MINUTES BEFORE THE EVENING MEAL FOR 8 WEEKS 180 capsule 1  . PARoxetine (PAXIL) 40 MG tablet TAKE 1 TABLET(40 MG) BY MOUTH EVERY MORNING 90 tablet 1  . rOPINIRole (REQUIP) 1 MG tablet TAKE 1 TABLET(1 MG) BY MOUTH AT BEDTIME 90 tablet  0  . TURMERIC PO Take 1 tablet by mouth daily. Takes '300mg'$  daily     No facility-administered medications prior to visit.      Past Medical History:  Diagnosis Date  . Anxiety   . Arthritis    "was in my knees before OR; now in my hands" (05/05/2014)  . Bronchitis   . Chronic bronchitis ([place])    "used to get it q yr; not as much since septal repair in 2004"  . Depression   . GERD (gastroesophageal reflux disease)   . High triglycerides   . HSV infection   . Hyperlipidemia   . Hypertension   . Left knee DJD   . Obesity   . Primary localized osteoarthritis of right knee   . Restless leg syndrome   . Sleep apnea    mild; does not wear CPAP  . Type II diabetes mellitus  ([place])   . Urgency of urination    and frequency takes Vesicare  . Urinary incontinence   . Walking pneumonia ~ 2013      Review of Systems  Constitutional: Negative for chills, fatigue and fever.  HENT: Negative for congestion, ear pain, hearing loss, rhinorrhea, sinus pain, sinus pressure, sneezing and sore throat.   Respiratory: Positive for cough and wheezing. Negative for chest tightness.   Cardiovascular: Negative for chest pain, palpitations and leg swelling.  Neurological: Negative for headaches.      Objective:    BP 118/60 (BP Location: Left Arm, Patient Position: Sitting, Cuff Size: Large)   Pulse 87   Temp 98.7 F (37.1 C) (Oral)   Resp 16   Ht 5' 3.75" (1.619 m)   Wt 239 lb (108.4 kg)   SpO2 93%   BMI 41.35 kg/m  Nursing note and vital signs reviewed.  Physical Exam  Constitutional: She is oriented to person, place, and time. She appears well-developed and well-nourished. No distress.  HENT:  Right Ear: Hearing, tympanic membrane, external ear and ear canal normal.  Left Ear: Hearing, tympanic membrane, external ear and ear canal normal.  Nose: Nose normal.  Mouth/Throat: Uvula is midline, oropharynx is clear and moist and mucous membranes are normal.  Cardiovascular: Normal rate, regular rhythm and intact distal pulses.  Exam reveals no gallop and no friction rub.   No murmur heard. Pulmonary/Chest: Effort normal. No respiratory distress. She has wheezes. She has no rales. She exhibits no tenderness.  Neurological: She is alert and oriented to person, place, and time.  Skin: Skin is warm and dry.  Psychiatric: She has a normal mood and affect. Her behavior is normal. Judgment and thought content normal.       Assessment & Plan:   Problem List Items Addressed This Visit      Respiratory   Acute upper respiratory infection - Primary    New-onset coughing with symptoms and exam concerning for acute upper respiratory infection or early bronchitis. Start  azithromycin. Sample of Breo provided to help with cough and wheezing. Continue over-the-counter medications as needed for symptom relief and supportive care.      Relevant Medications   azithromycin (ZITHROMAX) 250 MG tablet       I am having Ms. Kisiel start on fluticasone furoate-vilanterol and azithromycin. I am also having her maintain her multivitamin with minerals, aspirin, TURMERIC PO, losartan-hydrochlorothiazide, fenofibrate, omeprazole, OSTEO BI-FLEX ADV JOINT SHIELD, fesoterodine, glipiZIDE, PARoxetine, glipiZIDE, CARTIA XT, rOPINIRole, ONE TOUCH SURESOFT, glucose blood, blood glucose meter kit and supplies, BYDUREON BCISE, and metFORMIN.  Meds ordered this encounter  Medications  . fluticasone furoate-vilanterol (BREO ELLIPTA) 100-25 MCG/INH AEPB    Sig: Inhale 1 puff into the lungs daily.    Dispense:  28 each    Refill:  0    Order Specific Question:   Supervising Provider    Answer:   Pricilla Holm A [[id number]]  . azithromycin (ZITHROMAX) 250 MG tablet    Sig: Take 2 tablets by mouth for 1 day then 1 tablet by mouth daily for 4 days.    Dispense:  6 tablet    Refill:  0    Order Specific Question:   Supervising Provider    Answer:   Pricilla Holm A [[id number]]     Follow-up: Return if symptoms worsen or fail to improve.  Mauricio Po, FNP

## 2017-04-20 NOTE — Patient Instructions (Addendum)
Thank you for choosing [facility].  SUMMARY AND INSTRUCTIONS:  Please start the Breo daily.  Start the azithromycin.  Continue OTC medications as needed.   Follow up if symptoms worsen or do not improve.   Medication:  Your prescription(s) have been submitted to your pharmacy or been printed and provided for you. Please take as directed and contact our office if you believe you are having problem(s) with the medication(s) or have any questions.  Follow up:  If your symptoms worsen or fail to improve, please contact our office for further instruction, or in case of emergency go directly to the emergency room at the closest medical facility.    General Recommendations:    Please drink plenty of fluids.  Get plenty of rest   Sleep in humidified air  Use saline nasal sprays  Netti pot   OTC Medications:  Decongestants - helps relieve congestion   Flonase (generic fluticasone) or Nasacort (generic triamcinolone) - please make sure to use the "cross-over" technique at a 45 degree angle towards the opposite eye as opposed to straight up the nasal passageway.   Sudafed (generic pseudoephedrine - Note this is the one that is available behind the pharmacy counter); Products with phenylephrine (-PE) may also be used but is often not as effective as pseudoephedrine.   If you have HIGH BLOOD PRESSURE - Coricidin HBP; AVOID any product that is -D as this contains pseudoephedrine which may increase your blood pressure.  Afrin (oxymetazoline) every 6-8 hours for up to 3 days.   Allergies - helps relieve runny nose, itchy eyes and sneezing   Claritin (generic loratidine), Allegra (fexofenidine), or Zyrtec (generic cyrterizine) for runny nose. These medications should not cause drowsiness.  Note - Benadryl (generic diphenhydramine) may be used however may cause drowsiness  Cough -   Delsym or Robitussin (generic dextromethorphan)  Expectorants - helps loosen mucus to  ease removal   Mucinex (generic guaifenesin) as directed on the package.  Headaches / General Aches   Tylenol (generic acetaminophen) - DO NOT EXCEED 3 grams (3,000 mg) in a 24 hour time period  Advil/Motrin (generic ibuprofen)   Sore Throat -   Salt water gargle   Chloraseptic (generic benzocaine) spray or lozenges / Sucrets (generic dyclonine)

## 2017-04-24 ENCOUNTER — Ambulatory Visit (INDEPENDENT_AMBULATORY_CARE_PROVIDER_SITE_OTHER): Payer: 59 | Admitting: Neurology

## 2017-04-24 ENCOUNTER — Encounter: Payer: Self-pay | Admitting: Neurology

## 2017-04-24 VITALS — BP 127/72 | HR 84 | Ht 64.0 in | Wt 239.0 lb

## 2017-04-24 DIAGNOSIS — G4734 Idiopathic sleep related nonobstructive alveolar hypoventilation: Secondary | ICD-10-CM

## 2017-04-24 DIAGNOSIS — Z9989 Dependence on other enabling machines and devices: Secondary | ICD-10-CM | POA: Diagnosis not present

## 2017-04-24 DIAGNOSIS — G4733 Obstructive sleep apnea (adult) (pediatric): Secondary | ICD-10-CM

## 2017-04-24 NOTE — Progress Notes (Signed)
Subjective:    Patient ID: Diane Gregory is a 61 y.o. female.  HPI     Interim history:   Diane Gregory is a 61 year old right-handed woman with an underlying medical history of reflux disease, depression, chronic bronchitis, anxiety, arthritis, s/p b/l TKAs in 2015 and 2016, hyperlipidemia, hypertension, restless leg syndrome, type 2 diabetes, and morbid obesity with a BMI of over 40, who presents for follow-up consultation of her obstructive sleep apnea, after recent sleep testing. The patient is unaccompanied today. I first met her on 01/02/2017 at the request of her primary care provider, at which time she reported a prior diagnosis of OSA. She also had airway surgery after that. I invited her for sleep study testing. She had a home sleep test only as her insurance denied and attended sleep study. Her home sleep test from 02/05/2017 indicated at least moderate obstructive sleep apnea with an AHI of 26.3 per hour, O2 nadir of 64%. Her insurance also denied a proper titration study and she started AutoPap therapy at home.  Today, 04/24/2017: I reviewed her AutoPap compliance data from 03/24/2017 through 04/22/2017 which is a total of 30 days, during which time she used her machine every night with percent used days greater than 4 hours at 80%, indicating good compliance with an average usage of 4 hours and 30 minutes, residual AHI at goal at 1.9 per hour, 95th percentile pressure at 10.9 cm, leak acceptable with the 95th percentile at 14.4 L/m on a pressure of 5-15 cm with EPR. She reports sleeping better with AutoPap. She feels that her sleep quality sleep consolidation and daytime somnolence have improved. Had a bout of bronchitis, which explains her decreased usage recently, and she just finished a Zpack. She was out of town, for a few nights, and had trouble sleeping in a new surrounding. Of note, her nocturia is much improved as well.  The patient's allergies, current medications, family history,  past medical history, past social history, past surgical history and problem list were reviewed and updated as appropriate.   Previously (copied from previous notes for reference):   01/02/2017: (She) reports snoring and excessive daytime somnolence. I reviewed your office note from 11/28/2016. She was previously diagnosed with obstructive sleep apnea, as I understand, but is currently not on CPAP therapy. Prior sleep study results are not available for my review today. Sleep study testing was about 15 years ago. She had airway surgery after that. This included tonsillectomy and correction of deviated septum. Her Epworth sleepiness score is 11 out of 24, fatigue score is 37 out of 63. For her restless leg syndrome she had been on Mirapex for years, but it became less effective. She switched to Requip last year, currently 1 mg at bedtime which she feels has been helpful. The patient is widowed, she has 2 grown children. She lives alone. She works at a [employer], as an Web designer. She is a nonsmoker and drinks alcohol infrequently, maybe once or twice a month, drinks caffeine usually 2-3 servings per day, usually before 4 PM. Her bedtime is generally between 10 and 11 PM. Wakeup time is around 6 AM. She has tried p.m. type medications for occasional difficulty falling asleep but has daytime grogginess. She does have nocturia once or twice per average night, she denies morning headaches. She had some Low back pain. She is not aware of any formal diagnosis of sleep apnea in her family but her father snored loudly and her brother snores.  Her  Past Medical History Is Significant For: Past Medical History:  Diagnosis Date  . Anxiety   . Arthritis    "was in my knees before OR; now in my hands" (05/05/2014)  . Bronchitis   . Chronic bronchitis ([place])    "used to get it q yr; not as much since septal repair in 2004"  . Depression   . GERD (gastroesophageal reflux disease)   . High  triglycerides   . HSV infection   . Hyperlipidemia   . Hypertension   . Left knee DJD   . Obesity   . Primary localized osteoarthritis of right knee   . Restless leg syndrome   . Sleep apnea    mild; does not wear CPAP  . Type II diabetes mellitus ([place])   . Urgency of urination    and frequency takes Vesicare  . Urinary incontinence   . Walking pneumonia ~ 2013    Her Past Surgical History Is Significant For: Past Surgical History:  Procedure Laterality Date  . COLONOSCOPY    . HYSTEROSCOPY  11/94   submuous myomas  . KNEE ARTHROSCOPY Right ~ 2010  . KNEE ARTHROSCOPY W/ MENISCECTOMY Right 2010  . NASAL SEPTUM SURGERY    . REFRACTIVE SURGERY Bilateral ~ 2005  . TONSILLECTOMY  ~ 2004  . TOTAL KNEE ARTHROPLASTY Left 08/11/2013   Procedure: LEFT TOTAL KNEE ARTHROPLASTY;  Surgeon: Lorn Junes, MD;  Location: [facility];  Service: Orthopedics;  Laterality: Left;  . TOTAL KNEE ARTHROPLASTY Right 05/04/2014   Procedure: RIGHT TOTAL KNEE ARTHROPLASTY;  Surgeon: Lorn Junes, MD;  Location: [facility];  Service: Orthopedics;  Laterality: Right;  . UVULOPALATOPHARYNGOPLASTY, TONSILLECTOMY AND SEPTOPLASTY  2004  . VAGINAL HYSTERECTOMY  06/1993    Her Family History Is Significant For: Family History  Problem Relation Age of Onset  . Heart disease Mother   . Hypertension Mother   . Stroke Mother   . Breast cancer Mother   . Diabetes Mellitus II Father   . Cancer Father        CLL  . Hypertension Brother   . Breast cancer Paternal Grandmother   . Breast cancer Maternal Grandmother     Her Social History Is Significant For: Social History   Social History  . Marital status: Widowed    Spouse name: N/A  . Number of children: 2  . Years of education: 16   Occupational History  . Intake Coordinator    Social History Main Topics  . Smoking status: Never Smoker  . Smokeless tobacco: Never Used  . Alcohol use 0.6 oz/week    1 Standard drinks or equivalent per week      Comment: 05/05/2014 "might have a drink once/month"  . Drug use: No  . Sexual activity: Not Currently    Partners: Male    Birth control/ protection: Surgical     Comment: [facility] 1994   Other Topics Concern  . None   Social History Narrative   Currently single. Fun: Crafts, flea markets, antiquing.    Denies religious beliefs that effect health care.    Caffeine: 2-3 drinks a day     Her Allergies Are:  Allergies  Allergen Reactions  . Celebrex [Celecoxib] Swelling  . Lisinopril Cough  . Meloxicam Swelling  . Nsaids Swelling  . Penicillins Other (See Comments)    Drop in blood pressure  . Statins Other (See Comments)    edema  . Lidocaine Rash    "bad taste in mouth"  with dental work  :   Her Current Medications Are:  Outpatient Encounter Prescriptions as of 04/24/2017  Medication Sig  . aspirin 81 MG tablet Take 81 mg by mouth daily.  . blood glucose meter kit and supplies KIT Dispense based on patient and insurance preference. Use up to four times daily as directed. Dx code: E11.65  . BYDUREON BCISE 2 MG/0.85ML AUIJ INJECT 2 MG UNDER THE SKIN ONCE A WEEK  . CARTIA XT 240 MG 24 hr capsule TAKE 1 CAPSULE BY MOUTH EVERY DAY  . fenofibrate 160 MG tablet TAKE 1 TABLET(160 MG) BY MOUTH DAILY  . fesoterodine (TOVIAZ) 8 MG TB24 tablet Take 1 tablet (8 mg total) by mouth daily.  . fluticasone furoate-vilanterol (BREO ELLIPTA) 100-25 MCG/INH AEPB Inhale 1 puff into the lungs daily.  Marland Kitchen glipiZIDE (GLUCOTROL XL) 2.5 MG 24 hr tablet TAKE 1 TABLET(2.5 MG) BY MOUTH DAILY WITH BREAKFAST  . glipiZIDE (GLUCOTROL XL) 5 MG 24 hr tablet TAKE 1 TABLET(5 MG) BY MOUTH DAILY WITH BREAKFAST  . glucose blood (ONE TOUCH ULTRA TEST) test strip Use as instructed  . Lancets Misc. (ONE TOUCH SURESOFT) MISC Use 1 lancet per test. Test blood sugars 1-4 times per day as instructed.  Marland Kitchen losartan-hydrochlorothiazide (HYZAAR) 100-25 MG tablet TAKE 1 TABLET BY MOUTH DAILY  . metFORMIN (GLUCOPHAGE-XR) 500 MG 24 hr  tablet TAKE 2 TABLETS BY MOUTH DAILY WITH BREAKFAST  . Misc Natural Products (OSTEO BI-FLEX ADV JOINT SHIELD) TABS   . Multiple Vitamin (MULTIVITAMIN WITH MINERALS) TABS tablet Take 1 tablet by mouth daily.  Marland Kitchen omeprazole (PRILOSEC) 10 MG capsule TAKE ONE CAPSULE BY MOUTH TWICE DAILY, 30 MINUTES BEFORE BREAKFAST AND 30 MINUTES BEFORE THE EVENING MEAL FOR 8 WEEKS  . PARoxetine (PAXIL) 40 MG tablet TAKE 1 TABLET(40 MG) BY MOUTH EVERY MORNING  . rOPINIRole (REQUIP) 1 MG tablet TAKE 1 TABLET(1 MG) BY MOUTH AT BEDTIME  . TURMERIC PO Take 1 tablet by mouth daily. Takes 347m daily  . [DISCONTINUED] azithromycin (ZITHROMAX) 250 MG tablet Take 2 tablets by mouth for 1 day then 1 tablet by mouth daily for 4 days.   No facility-administered encounter medications on file as of 04/24/2017.   :  Review of Systems:  Out of a complete 14 point review of systems, all are reviewed and negative with the exception of these symptoms as listed below:  Review of Systems  Neurological:       Pt presents today to discuss her new auto pap. Pt feels better and is sleeping better with the auto pap. Pt is using [employer] as his DME.    Objective:  Neurological Exam  Physical Exam Physical Examination:   Vitals:   04/24/17 1301  BP: 127/72  Pulse: 84    General Examination: The patient is a very pleasant 61y.o. female in no acute distress. She appears well-developed and well-nourished and well groomed.   HEENT: Normocephalic, atraumatic, pupils are equal, round and reactive to light and accommodation. Extraocular tracking is good without limitation to gaze excursion or nystagmus noted. Normal smooth pursuit is noted. Hearing is grossly intact. Tympanic membranes are clear bilaterally. Face is symmetric with normal facial animation and normal facial sensation. Speech is clear with no dysarthria noted. There is no hypophonia. There is no lip, neck/head, jaw or voice tremor. Neck is supple with full range of passive  and active motion. There are no carotid bruits on auscultation. Oropharynx exam reveals: mild mouth dryness, adequate dental hygiene and mild airway  crowding, due to redundant and thicker soft palate. Mallampati is class I. Tongue protrudes centrally and palate elevates symmetrically. Tonsils are absent. S/p UPPP.   Chest: Clear to auscultation without wheezing, rhonchi or crackles noted.  Heart: S1+S2+0, regular and normal without murmurs, rubs or gallops noted.   Abdomen: Soft, non-tender and non-distended with normal bowel sounds appreciated on auscultation.  Extremities: There is no pitting edema in the distal lower extremities bilaterally. Pedal pulses are intact.  Skin: Warm and dry without trophic changes noted.  Musculoskeletal: exam reveals no obvious joint deformities, tenderness or joint swelling or erythema, R knee slightly bigger than L, some midline LBP reported, no radiation.   Neurologically:  Mental status: The patient is awake, alert and oriented in all 4 spheres. Her immediate and remote memory, attention, language skills and fund of knowledge are appropriate. There is no evidence of aphasia, agnosia, apraxia or anomia. Speech is clear with normal prosody and enunciation. Thought process is linear. Mood is normal and affect is normal.  Cranial nerves II - XII are as described above under HEENT exam. In addition: shoulder shrug is normal with equal shoulder height noted. Motor exam: Normal bulk, strength and tone is noted. There is no drift, tremor or rebound. Reflexes are 1-2+ throughout, absent in both knees. Fine motor skills and coordination: intact with normal finger taps, normal hand movements, normal rapid alternating patting, normal foot taps and normal foot agility.  Cerebellar testing: No dysmetria or intention tremor on finger to nose testing. There is no truncal or gait ataxia.  Sensory exam: intact to light touch in the upper and lower extremities.  Gait,  station and balance: She stands easily. No veering to one side is noted. No leaning to one side is noted. Posture is age-appropriate and stance is narrow based. Gait shows normal stride length and normal pace. No problems turning are noted.   Assessment and Plan:  In summary, Lesli P Pistilli is a very pleasant 61 year old female with an underlying medical history of reflux disease, depression, chronic bronchitis, anxiety, arthritis, s/p b/l TKAs in 2015 and 2016, hyperlipidemia, hypertension, restless leg syndrome, type 2 diabetes, and morbid obesity with a BMI of over 40, who  presents for follow-up consultaion of  Her obstructive sleep apnea which was in the moderate range as per her home sleep test results from 02/05/2017. She did have severe desaturations. She has since then established treatment at home with autoPAP and is compliant with therapy. She is commended for her treatment adherence. We talked about her compliance data at length today. Given her severe desaturations and inability to actually go through with a sleep study as her insurance denied both the diagnostic and titration studies, I would like to proceed with an overnight pulse oximetry test while she is on her AutoPap therapy at night. We will do a pulse ox at home for 1 night through her DME company. She is agreeable. We will call her with her test results. Thankfully, she feels improved in her sleep consolidation, daytime somnolence and nocturia. She is motivated to continue with treatment. I suggested a 6 month follow-up, sooner as needed. In the interim, we will be in touch with her as far as her pulse oximetry test results.  She had a recent setback because she had a bronchitis, she is getting over her symptoms and has finished her antibiotic. She is encouraged to continue with treatment at the current settings. She is using a nose mask. I answered all her  questions today and she was in agreement with the plan.

## 2017-04-24 NOTE — Patient Instructions (Signed)
Please continue using your autoPAP regularly. While your insurance requires that you use PAP at least 4 hours each night on 70% of the nights, I recommend, that you not skip any nights and use it throughout the night if you can. Getting used to PAP and staying with the treatment long term does take time and patience and discipline. Untreated obstructive sleep apnea when it is moderate to severe can have an adverse impact on cardiovascular health and raise her risk for heart disease, arrhythmias, hypertension, congestive heart failure, stroke and diabetes. Untreated obstructive sleep apnea causes sleep disruption, nonrestorative sleep, and sleep deprivation. This can have an impact on your day to day functioning and cause daytime sleepiness and impairment of cognitive function, memory loss, mood disturbance, and problems focussing. Using PAP regularly can improve these symptoms.  As discussed, we will do an overnight oxygen level test, called ONO, and your DME company will call and set this up for one night, while you also use your autoPAP as usual. We will call you with the results. This is to make sure that your oxygen levels stay in the 90s, while you are treated with autoPAP for your OSA. Remember, your oxygen levels dropped into the 70s as low as 64% during the home sleep test.

## 2017-05-17 ENCOUNTER — Encounter: Payer: Self-pay | Admitting: Neurology

## 2017-05-17 ENCOUNTER — Encounter: Payer: Self-pay | Admitting: Family

## 2017-05-17 ENCOUNTER — Telehealth: Payer: Self-pay | Admitting: Neurology

## 2017-05-17 DIAGNOSIS — G4733 Obstructive sleep apnea (adult) (pediatric): Secondary | ICD-10-CM

## 2017-05-17 DIAGNOSIS — Z9989 Dependence on other enabling machines and devices: Principal | ICD-10-CM

## 2017-05-17 DIAGNOSIS — G4734 Idiopathic sleep related nonobstructive alveolar hypoventilation: Secondary | ICD-10-CM

## 2017-05-17 NOTE — Telephone Encounter (Signed)
I called pt. I explained her ONO results to her. Pt is agreeable to coming in for a cpap titration study as long as her insurance covers it. Pt verbalized understanding of results. Pt had no questions at this time but was encouraged to call back if questions arise.

## 2017-05-17 NOTE — Telephone Encounter (Signed)
I reviewed the patient's ONO (overnight pulse oximetry report) from 05/03/17, while on room air and her autoPAP. Her baseline oxygen saturation for the night was 89% and minimum oxygen saturation was 75%. Time below 88% saturation was 2 hours and 23 minutes. This indicates abnormal oxygen saturations while asleep but given that this was only an overnight pulse oximetry test it may not be fully accurate and reliable. I would favor bringing the patient back for a full night CPAP titration study. This would allow [place] to monitor her oxygen saturations and also titrate supplemental oxygen if the need arises. Please explain to pt.   Will copy Robin and Dawn.   Star Age, MD, PhD [facility])

## 2017-05-21 ENCOUNTER — Telehealth: Payer: Self-pay | Admitting: Neurology

## 2017-05-21 DIAGNOSIS — G4733 Obstructive sleep apnea (adult) (pediatric): Secondary | ICD-10-CM

## 2017-05-21 NOTE — Telephone Encounter (Signed)
Pt is already on auto pap. Dr. Rexene Alberts wanted [place] to try and get a cpap titration study approved. ONO results have been received.

## 2017-05-21 NOTE — Telephone Encounter (Signed)
I didn't get an approval on this patient.  Do I need to go back to [employer] with new information to see if I can get an approval.?

## 2017-05-21 NOTE — Telephone Encounter (Signed)
[employer] denied CPAP Titration suggest Auto pap °

## 2017-05-26 ENCOUNTER — Other Ambulatory Visit: Payer: Self-pay | Admitting: Family

## 2017-05-28 NOTE — Telephone Encounter (Signed)
Diane Gregory, can you try to submit for CPAP titration? Pt on autopap with good compliance, recent quite abn ONO.

## 2017-05-28 NOTE — Telephone Encounter (Signed)
This has been faxed in to [facility] today for review.  Waiting on approval.

## 2017-06-04 ENCOUNTER — Other Ambulatory Visit: Payer: Self-pay | Admitting: *Deleted

## 2017-06-04 DIAGNOSIS — K2100 Gastro-esophageal reflux disease with esophagitis, without bleeding: Secondary | ICD-10-CM

## 2017-06-05 ENCOUNTER — Encounter: Payer: Self-pay | Admitting: Family

## 2017-06-07 ENCOUNTER — Other Ambulatory Visit: Payer: Self-pay | Admitting: *Deleted

## 2017-06-07 DIAGNOSIS — K2100 Gastro-esophageal reflux disease with esophagitis, without bleeding: Secondary | ICD-10-CM

## 2017-06-08 ENCOUNTER — Telehealth: Payer: Self-pay | Admitting: Family

## 2017-06-08 MED ORDER — DILTIAZEM HCL ER COATED BEADS 240 MG PO CP24: ORAL_CAPSULE | ORAL | 0 refills | Status: DC

## 2017-06-08 NOTE — Addendum Note (Signed)
Addended by: Earnstine Regal on: 06/08/2017 11:05 AM   Modules accepted: Orders

## 2017-06-08 NOTE — Telephone Encounter (Signed)
Patient would like her CARTIA XT 240 MG 24 hr capsule  Sent to [facility]  Please advise  Did not want it sent to [facility]

## 2017-06-08 NOTE — Telephone Encounter (Signed)
[name] appt w/Ashleigh 11/6 must keep appt for future refills...Diane Gregory

## 2017-06-11 ENCOUNTER — Other Ambulatory Visit: Payer: Self-pay

## 2017-06-11 MED ORDER — LOSARTAN POTASSIUM-HCTZ 100-25 MG PO TABS: 1.0000 | ORAL_TABLET | Freq: Every day | ORAL | 0 refills | Status: DC

## 2017-06-11 MED ORDER — ROPINIROLE HCL 1 MG PO TABS: ORAL_TABLET | ORAL | 0 refills | Status: DC

## 2017-06-11 MED ORDER — FENOFIBRATE 160 MG PO TABS: ORAL_TABLET | ORAL | 0 refills | Status: DC

## 2017-06-22 ENCOUNTER — Ambulatory Visit (INDEPENDENT_AMBULATORY_CARE_PROVIDER_SITE_OTHER): Payer: 59 | Admitting: Nurse Practitioner

## 2017-06-22 ENCOUNTER — Encounter: Payer: Self-pay | Admitting: Nurse Practitioner

## 2017-06-22 ENCOUNTER — Other Ambulatory Visit (INDEPENDENT_AMBULATORY_CARE_PROVIDER_SITE_OTHER): Payer: 59

## 2017-06-22 VITALS — BP 122/68 | HR 73 | Temp 98.1°F | Resp 16 | Ht 64.0 in | Wt 242.0 lb

## 2017-06-22 DIAGNOSIS — E1165 Type 2 diabetes mellitus with hyperglycemia: Secondary | ICD-10-CM

## 2017-06-22 DIAGNOSIS — Z23 Encounter for immunization: Secondary | ICD-10-CM | POA: Diagnosis not present

## 2017-06-22 DIAGNOSIS — Z862 Personal history of diseases of the blood and blood-forming organs and certain disorders involving the immune mechanism: Secondary | ICD-10-CM

## 2017-06-22 DIAGNOSIS — R232 Flushing: Secondary | ICD-10-CM | POA: Diagnosis not present

## 2017-06-22 DIAGNOSIS — Z0001 Encounter for general adult medical examination with abnormal findings: Secondary | ICD-10-CM | POA: Diagnosis not present

## 2017-06-22 DIAGNOSIS — E782 Mixed hyperlipidemia: Secondary | ICD-10-CM | POA: Diagnosis not present

## 2017-06-22 DIAGNOSIS — Z0000 Encounter for general adult medical examination without abnormal findings: Secondary | ICD-10-CM

## 2017-06-22 LAB — LIPID PANEL
Cholesterol: 175 mg/dL (ref 0–200)
HDL: 39.9 mg/dL (ref >39.00)
LDL Cholesterol: 101 mg/dL — ABNORMAL HIGH (ref 0–99)
NONHDL: 135.49
Total CHOL/HDL Ratio: 4
Triglycerides: 170 mg/dL — ABNORMAL HIGH (ref 0.0–149.0)
VLDL: 34 mg/dL (ref 0.0–40.0)

## 2017-06-22 LAB — COMPREHENSIVE METABOLIC PANEL
ALK PHOS: 80 U/L (ref 39–117)
ALT: 43 U/L — ABNORMAL HIGH (ref 0–35)
AST: 30 U/L (ref 0–37)
Albumin: 4.2 g/dL (ref 3.5–5.2)
BILIRUBIN TOTAL: 0.4 mg/dL (ref 0.2–1.2)
BUN: 18 mg/dL (ref 6–23)
CO2: 29 mEq/L (ref 19–32)
CREATININE: 0.57 mg/dL (ref 0.40–1.20)
Calcium: 9.3 mg/dL (ref 8.4–10.5)
Chloride: 99 mEq/L (ref 96–112)
GFR: 114.06 mL/min (ref >60.00)
GLUCOSE: 180 mg/dL — AB (ref 70–99)
Potassium: 4 mEq/L (ref 3.5–5.1)
Sodium: 137 mEq/L (ref 135–145)
TOTAL PROTEIN: 7 g/dL (ref 6.0–8.3)

## 2017-06-22 LAB — CBC
HCT: 41.4 % (ref 36.0–46.0)
HEMOGLOBIN: 13.8 g/dL (ref 12.0–15.0)
MCHC: 33.4 g/dL (ref 30.0–36.0)
MCV: 87.2 fl (ref 78.0–100.0)
Platelets: 217 10*3/uL (ref 150.0–400.0)
RBC: 4.74 Mil/uL (ref 3.87–5.11)
RDW: 13.5 % (ref 11.5–15.5)
WBC: 9.4 10*3/uL (ref 4.0–10.5)

## 2017-06-22 LAB — TSH: TSH: 1.3 u[IU]/mL (ref 0.35–4.50)

## 2017-06-22 LAB — HEMOGLOBIN A1C: HEMOGLOBIN A1C: 6.6 % — AB (ref 4.6–6.5)

## 2017-06-22 NOTE — Assessment & Plan Note (Signed)
History of anemia-maintained on OTC iron supplement. Denies dizziness, pallor. Comprehensive metabolic panel, CBC today.

## 2017-06-22 NOTE — Assessment & Plan Note (Signed)
Annual physical exam with abnormal findings Flu shot given today. Health maintenance up to date. Healthy diet and exercise discussed including reduced red meat and 1/2 plate of veggies at meals, getting 150 minutes of exercise per week. Preventive care handout given.

## 2017-06-22 NOTE — Progress Notes (Signed)
Subjective:    Patient ID: Diane Gregory, female    DOB: 11-01-1955, 61 y.o.   MRN: [id number]  HPI Diane Gregory is a 61 yo female who presents today to establish care. She is transferring to me from another provider in the same clinic. Patient presents today for complete physical.  Immunizations: Flu today Colonoscopy: up to date Pap Smear: hysterectomy Mammogram: up to date Smoker: never Vision: annually  Dental: routine cleanings and xrays Diet: Breakfast- eggs and [place] sausage Lunch- east out salads, meat, vegetables Dinner- meat and vegetables Snacks- fruits, almonds, cheese Drinks- only artificial sweeteners, wants to increase her water intake No caffeine after 3pm Exercise: Not regularly- does routine yard and housework Gets up and moves at work regularly Dermatology exams-twice a year  Review of Systems  Constitutional: Negative.  Negative for activity change and appetite change.  HENT: Negative for dental problem and voice change.   Eyes: Negative for visual disturbance.  Respiratory: Positive for cough. Negative for shortness of breath.   Cardiovascular: Negative for chest pain and palpitations.  Gastrointestinal: Negative for constipation and diarrhea.  Endocrine: Positive for heat intolerance. Negative for cold intolerance.  Genitourinary: Negative for difficulty urinating and hematuria.  Musculoskeletal: Positive for arthralgias and back pain. Negative for myalgias.  Skin: Negative for rash.  Allergic/Immunologic: Positive for environmental allergies. Negative for food allergies.  Neurological: Negative for speech difficulty and headaches.  Hematological: Does not bruise/bleed easily.  Psychiatric/Behavioral:       Negative for depression or anxiety.      Past Medical History:  Diagnosis Date  . Anxiety   . Arthritis    "was in my knees before OR; now in my hands" (05/05/2014)  . Bronchitis   . Chronic bronchitis ([place])    "used to get it q yr; not as  much since septal repair in 2004"  . Depression   . GERD (gastroesophageal reflux disease)   . High triglycerides   . HSV infection   . Hyperlipidemia   . Hypertension   . Left knee DJD   . Obesity   . Primary localized osteoarthritis of right knee   . Restless leg syndrome   . Sleep apnea    mild; does not wear CPAP  . Type II diabetes mellitus ([place])   . Urgency of urination    and frequency takes Vesicare  . Urinary incontinence   . Walking pneumonia ~ 2013     Social History   Socioeconomic History  . Marital status: Widowed    Spouse name: Not on file  . Number of children: 2  . Years of education: 69  . Highest education level: Not on file  Social Needs  . Financial resource strain: Not on file  . Food insecurity - worry: Not on file  . Food insecurity - inability: Not on file  . Transportation needs - medical: Not on file  . Transportation needs - non-medical: Not on file  Occupational History  . Occupation: Intake Coordinator  Tobacco Use  . Smoking status: Never Smoker  . Smokeless tobacco: Never Used  Substance and Sexual Activity  . Alcohol use: Yes    Alcohol/week: 0.6 oz    Types: 1 Standard drinks or equivalent per week    Comment: 05/05/2014 "might have a drink once/month"  . Drug use: No  . Sexual activity: Not Currently    Partners: Male    Birth control/protection: Surgical    Comment: [facility] 1994  Other Topics Concern  .  Not on file  Social History Narrative   Currently single. Fun: Crafts, flea markets, antiquing.    Denies religious beliefs that effect health care.    Caffeine: 2-3 drinks a day     Past Surgical History:  Procedure Laterality Date  . COLONOSCOPY    . HYSTEROSCOPY  11/94   submuous myomas  . KNEE ARTHROSCOPY Right ~ 2010  . KNEE ARTHROSCOPY W/ MENISCECTOMY Right 2010  . NASAL SEPTUM SURGERY    . REFRACTIVE SURGERY Bilateral ~ 2005  . TONSILLECTOMY  ~ 2004  . UVULOPALATOPHARYNGOPLASTY, TONSILLECTOMY AND SEPTOPLASTY   2004  . VAGINAL HYSTERECTOMY  06/1993    Family History  Problem Relation Age of Onset  . Heart disease Mother   . Hypertension Mother   . Stroke Mother   . Breast cancer Mother   . Diabetes Mellitus II Father   . Cancer Father        CLL  . Hypertension Brother   . Breast cancer Paternal Grandmother   . Breast cancer Maternal Grandmother     Allergies  Allergen Reactions  . Celebrex [Celecoxib] Swelling  . Lisinopril Cough  . Meloxicam Swelling  . Nsaids Swelling  . Penicillins Other (See Comments)    Drop in blood pressure  . Statins Other (See Comments)    edema  . Lidocaine Rash    "bad taste in mouth" with dental work    Current Outpatient Medications on File Prior to Visit  Medication Sig Dispense Refill  . aspirin 81 MG tablet Take 81 mg by mouth daily.    . blood glucose meter kit and supplies KIT Dispense based on patient and insurance preference. Use up to four times daily as directed. Dx code: E11.65 1 each 0  . BYDUREON BCISE 2 MG/0.85ML AUIJ INJECT 2 MG UNDER THE SKIN ONCE A WEEK 4 pen 2  . diltiazem (CARTIA XT) 240 MG 24 hr capsule TAKE 1 CAPSULE BY MOUTH EVERY DAY 90 capsule 0  . fenofibrate 160 MG tablet TAKE 1 TABLET(160 MG) BY MOUTH DAILY 30 tablet 0  . fesoterodine (TOVIAZ) 8 MG TB24 tablet Take 1 tablet (8 mg total) by mouth daily. 90 tablet 1  . fluticasone furoate-vilanterol (BREO ELLIPTA) 100-25 MCG/INH AEPB Inhale 1 puff into the lungs daily. 28 each 0  . glipiZIDE (GLUCOTROL XL) 2.5 MG 24 hr tablet TAKE 1 TABLET(2.5 MG) BY MOUTH DAILY WITH BREAKFAST 90 tablet 1  . glipiZIDE (GLUCOTROL XL) 5 MG 24 hr tablet TAKE 1 TABLET(5 MG) BY MOUTH DAILY WITH BREAKFAST 90 tablet 1  . glucose blood (ONE TOUCH ULTRA TEST) test strip Use as instructed 100 each 12  . Lancets Misc. (ONE TOUCH SURESOFT) MISC Use 1 lancet per test. Test blood sugars 1-4 times per day as instructed. 1 each 1  . losartan-hydrochlorothiazide (HYZAAR) 100-25 MG tablet Take 1 tablet by  mouth daily. Must keep appt for future refills 30 tablet 0  . metFORMIN (GLUCOPHAGE-XR) 500 MG 24 hr tablet TAKE 2 TABLETS BY MOUTH DAILY WITH BREAKFAST 180 tablet 0  . Misc Natural Products (OSTEO BI-FLEX ADV JOINT SHIELD) TABS     . Multiple Vitamin (MULTIVITAMIN WITH MINERALS) TABS tablet Take 1 tablet by mouth daily.    Marland Kitchen omeprazole (PRILOSEC) 10 MG capsule TAKE ONE CAPSULE BY MOUTH TWICE DAILY, 30 MINUTES BEFORE BREAKFAST AND 30 MINUTES BEFORE THE EVENING MEAL FOR 8 WEEKS 180 capsule 1  . PARoxetine (PAXIL) 40 MG tablet TAKE 1 TABLET(40 MG) BY MOUTH EVERY MORNING  90 tablet 1  . rOPINIRole (REQUIP) 1 MG tablet TAKE 1 TABLET(1 MG) BY MOUTH AT BEDTIME 30 tablet 0   No current facility-administered medications on file prior to visit.        Objective:   Physical Exam  BP 122/68 (BP Location: Left Arm, Patient Position: Sitting, Cuff Size: Large)   Pulse 73   Temp 98.1 F (36.7 C) (Oral)   Resp 16   Ht _0  (1.626 m)   Wt 242 lb (109.8 kg)   SpO2 95%   BMI 41.54 kg/m   General Appearance:    Alert, cooperative, no distress, appears stated age  Head:    Normocephalic, without obvious abnormality, atraumatic  Eyes:    PERRL, conjunctiva/corneas clear, EOM's intact, fundi    benign, both eyes  Ears:    Normal TM's and external ear canals, both ears  Nose:   Nares normal, septum midline, mucosa normal, no drainage    or sinus tenderness  Throat:   Lips, mucosa, and tongue normal; teeth and gums normal  Neck:   Supple, symmetrical, trachea midline, no adenopathy;    thyroid:  no enlargement/tenderness/nodules  Back:     Symmetric, no curvature, ROM normal, no CVA tenderness  Lungs:     Clear to auscultation bilaterally, respirations unlabored  Chest Wall:    No tenderness or deformity   Heart:    Regular rate and rhythm, S1 and S2 normal, no murmur, rub   or gallop     Abdomen:     Soft, non-tender, bowel sounds active all four quadrants,    no masses, no organomegaly          Extremities:   Extremities normal, atraumatic, no cyanosis or edema  Pulses:   2+ and symmetric all extremities  Skin:   Skin color, texture, turgor normal, no rashes or lesions  Lymph nodes:   Cervical, supraclavicular, and axillary nodes normal  Neurologic:   CNII-XII intact, normal strength, sensation and reflexes    Throughout Psychiatric: Normal mood, judgement, behavior        Assessment & Plan:   She will return in 6 months for maintenance of chronic conditions.  Hot flashes TSH today

## 2017-06-22 NOTE — Patient Instructions (Addendum)
Please head downstairs for lab work.  Please work on your diet and exercise as we discussed. Remember half of your plate should be veggies, one-fourth carbs, one-fourth meat, and don't eat meat at every meal. Also, remember to stay away from sugary drinks. I'd like for you to start incorporating exercise into your daily schedule. Start at 10 minutes a day, working up to 30 minutes five times a week.   Id like to see you back in 6 months or sooner if you need me.  It was nice to meet you. Thanks for letting me take care of you today :)   Preventive Care 40-64 Years, Female Preventive care refers to lifestyle choices and visits with your health care provider that can promote health and wellness. What does preventive care include?  A yearly physical exam. This is also called an annual well check.  Dental exams once or twice a year.  Routine eye exams. Ask your health care provider how often you should have your eyes checked.  Personal lifestyle choices, including: ? Daily care of your teeth and gums. ? Regular physical activity. ? Eating a healthy diet. ? Avoiding tobacco and drug use. ? Limiting alcohol use. ? Practicing safe sex. ? Taking low-dose aspirin daily starting at age 42. ? Taking vitamin and mineral supplements as recommended by your health care provider. What happens during an annual well check? The services and screenings done by your health care provider during your annual well check will depend on your age, overall health, lifestyle risk factors, and family history of disease. Counseling Your health care provider may ask you questions about your:  Alcohol use.  Tobacco use.  Drug use.  Emotional well-being.  Home and relationship well-being.  Sexual activity.  Eating habits.  Work and work Statistician.  Method of birth control.  Menstrual cycle.  Pregnancy history.  Screening You may have the following tests or measurements:  Height, weight, and  BMI.  Blood pressure.  Lipid and cholesterol levels. These may be checked every 5 years, or more frequently if you are over 40 years old.  Skin check.  Lung cancer screening. You may have this screening every year starting at age 32 if you have a 30-pack-year history of smoking and currently smoke or have quit within the past 15 years.  Fecal occult blood test (FOBT) of the stool. You may have this test every year starting at age 16.  Flexible sigmoidoscopy or colonoscopy. You may have a sigmoidoscopy every 5 years or a colonoscopy every 10 years starting at age 49.  Hepatitis C blood test.  Hepatitis B blood test.  Sexually transmitted disease (STD) testing.  Diabetes screening. This is done by checking your blood sugar (glucose) after you have not eaten for a while (fasting). You may have this done every 1-3 years.  Mammogram. This may be done every 1-2 years. Talk to your health care provider about when you should start having regular mammograms. This may depend on whether you have a family history of breast cancer.  BRCA-related cancer screening. This may be done if you have a family history of breast, ovarian, tubal, or peritoneal cancers.  Pelvic exam and Pap test. This may be done every 3 years starting at age 31. Starting at age 59, this may be done every 5 years if you have a Pap test in combination with an HPV test.  Bone density scan. This is done to screen for osteoporosis. You may have this scan if you  are at high risk for osteoporosis.  Discuss your test results, treatment options, and if necessary, the need for more tests with your health care provider. Vaccines Your health care provider may recommend certain vaccines, such as:  Influenza vaccine. This is recommended every year.  Tetanus, diphtheria, and acellular pertussis (Tdap, Td) vaccine. You may need a Td booster every 10 years.  Varicella vaccine. You may need this if you have not been vaccinated.  Zoster  vaccine. You may need this after age 66.  Measles, mumps, and rubella (MMR) vaccine. You may need at least one dose of MMR if you were born in 1957 or later. You may also need a second dose.  Pneumococcal 13-valent conjugate (PCV13) vaccine. You may need this if you have certain conditions and were not previously vaccinated.  Pneumococcal polysaccharide (PPSV23) vaccine. You may need one or two doses if you smoke cigarettes or if you have certain conditions.  Meningococcal vaccine. You may need this if you have certain conditions.  Hepatitis A vaccine. You may need this if you have certain conditions or if you travel or work in places where you may be exposed to hepatitis A.  Hepatitis B vaccine. You may need this if you have certain conditions or if you travel or work in places where you may be exposed to hepatitis B.  Haemophilus influenzae type b (Hib) vaccine. You may need this if you have certain conditions.  Talk to your health care provider about which screenings and vaccines you need and how often you need them. This information is not intended to replace advice given to you by your health care provider. Make sure you discuss any questions you have with your health care provider. Document Released: 08/27/2015 Document Revised: 04/19/2016 Document Reviewed: 06/01/2015 Elsevier Interactive Patient Education  2017 [employer].

## 2017-06-22 NOTE — Assessment & Plan Note (Signed)
Mixed hyperlipidemia-maintained on fenofibrate. Taken off statin in past for myalgias. Lipid panel today.

## 2017-06-24 ENCOUNTER — Other Ambulatory Visit: Payer: Self-pay | Admitting: Nurse Practitioner

## 2017-06-25 ENCOUNTER — Other Ambulatory Visit: Payer: Self-pay

## 2017-06-25 MED ORDER — DILTIAZEM HCL ER COATED BEADS 240 MG PO CP24: ORAL_CAPSULE | ORAL | 0 refills | Status: DC

## 2017-06-26 ENCOUNTER — Ambulatory Visit (INDEPENDENT_AMBULATORY_CARE_PROVIDER_SITE_OTHER): Payer: 59 | Admitting: Neurology

## 2017-06-26 ENCOUNTER — Other Ambulatory Visit: Payer: Self-pay

## 2017-06-26 DIAGNOSIS — Z9989 Dependence on other enabling machines and devices: Secondary | ICD-10-CM

## 2017-06-26 DIAGNOSIS — G4734 Idiopathic sleep related nonobstructive alveolar hypoventilation: Secondary | ICD-10-CM

## 2017-06-26 DIAGNOSIS — G4733 Obstructive sleep apnea (adult) (pediatric): Secondary | ICD-10-CM | POA: Diagnosis not present

## 2017-06-26 MED ORDER — FESOTERODINE FUMARATE ER 8 MG PO TB24: 8.0000 mg | ORAL_TABLET | Freq: Every day | ORAL | 1 refills | Status: DC

## 2017-06-26 MED ORDER — LOSARTAN POTASSIUM-HCTZ 100-25 MG PO TABS: 1.0000 | ORAL_TABLET | Freq: Every day | ORAL | 1 refills | Status: DC

## 2017-06-27 ENCOUNTER — Other Ambulatory Visit: Payer: Self-pay | Admitting: Neurology

## 2017-06-27 ENCOUNTER — Telehealth: Payer: Self-pay

## 2017-06-27 DIAGNOSIS — G4733 Obstructive sleep apnea (adult) (pediatric): Secondary | ICD-10-CM

## 2017-06-27 NOTE — Telephone Encounter (Signed)
I called pt to discuss her sleep study results. No answer, left a message asking her to call me back. 

## 2017-06-27 NOTE — Telephone Encounter (Signed)
-----   Message from Star Age, MD sent at 06/27/2017  8:08 AM EST ----- Patient referred by Terri Piedra, seen by me on 01/02/17, HST on 02/07/17, last seen by me on 04/24/17, had subsequent abn ONO while on autoPAP, hence the full night titration on 06/26/17:   Please call and inform patient that I have entered an order for treatment with positive airway pressure (PAP) treatment of obstructive sleep apnea (OSA). She did well during the latest sleep study with BiPAP. We will, therefore, I will change her treatment from autoPAP to BiPAP. We will send the order to her DME. I will see the patient back in follow-up in about 10 weeks. Please also explain to the patient that I will be looking out for compliance data, which can be downloaded from the machine (stored on an SD card, that is inserted in the machine) or via remote access through a modem, that is built into the machine. At the time of the followup appointment we will discuss sleep study results and how it is going with PAP treatment at home. Please advise patient to bring Her machine at the time of the first FU visit, even though this is cumbersome. Bringing the machine for every visit after that will likely not be needed, but often helps for the first visit to troubleshoot if needed. Please re-enforce the importance of compliance with treatment and the need for [place] to monitor compliance data - often an insurance requirement and actually good feedback for the patient as far as how they are doing.  Also remind patient, that any interim PAP machine or mask issues should be first addressed with the DME company, as they can often help better with technical and mask fit issues. Please ask if patient has a preference regarding DME company.  Please also make sure, the patient has a follow-up appointment with me in about 10 weeks from the setup date, thanks.  Once you have spoken to the patient - and faxed/routed report to PCP and referring MD (if other than PCP),  you can close this encounter, thanks,   Star Age, MD, PhD [facility] ([place])

## 2017-06-27 NOTE — Procedures (Signed)
PATIENT'S NAME:  Diane Gregory, Firmin DOB:      1956-03-22      MR#:    [id number]     DATE OF RECORDING: 06/26/2017 REFERRING M.D.:  Mauricio Po FNP Study Performed:   CPAP  Titration HISTORY: 61 year old woman with a history of reflux disease, depression, chronic bronchitis, anxiety, arthritis, s/p b/l TKAs in 2015 and 2016, hyperlipidemia, hypertension, restless leg syndrome, type 2 diabetes, and morbid obesity with a BMI of over 40, who presents for a full night CPAP titration. She had a home sleep test on 02/05/2017, which showed an AHI of 26.3 per hour, O2 nadir of 64%. She started autoPAP therapy, but recently had an abnormal ONO while on autoPAP. The patient's weight 238 pounds with a height of 64 (inches), resulting in a BMI of 40.6 kg/m2. The patient's neck circumference measured 17 inches.  CURRENT MEDICATIONS: Aspirin, Cartia, Toviaz, Ellipta, Glipizide, Hyzaar, Metformin, Prilosec  PROCEDURE:  This is a multichannel digital polysomnogram utilizing the SomnoStar 11.2 system.  Electrodes and sensors were applied and monitored per AASM Specifications.   EEG, EOG, Chin and Limb EMG, were sampled at 200 Hz.  ECG, Snore and Nasal Pressure, Thermal Airflow, Respiratory Effort, CPAP Flow and Pressure, Oximetry was sampled at 50 Hz. Digital video and audio were recorded.      The patient used her Dreamwear small FFM. CPAP was titrated from 5 to 15 cm, and she was placed on BiPAP of 17/13 cm at the end of the night. On this final setting, her AHI was borderline at 4.7/hour and O2 nadir of 86%, in supine NREM sleep.   Lights Out was at 22:26 and Lights On at 05:00. Total recording time (TRT) was 394.5 minutes, with a total sleep time (TST) of 348 minutes. The patient's sleep latency was 14.5 minutes. REM latency was 272 minutes.  The sleep efficiency was 88.2 %.    SLEEP ARCHITECTURE: WASO (Wake after sleep onset) was 32 minutes. There were 24 minutes in Stage N1, 152.5 minutes Stage N2, 121 minutes  Stage N3 and 50.5 minutes in Stage REM.  The percentage of Stage N1 was 6.9%, Stage N2 was 43.8%, Stage N3 was 34.8% and Stage R (REM sleep) was 14.5%, which is mildly reduced. The arousals were noted as: 26 were spontaneous, 0 were associated with PLMs, 1 were associated with respiratory events.  Audio and video analysis did not show any abnormal or unusual movements, behaviors, phonations or vocalizations. The patient took 1 bathroom break. EKG was in keeping with normal sinus rhythm (NSR).  RESPIRATORY ANALYSIS:  There was a total of 19 respiratory events: 0 obstructive apneas, 0 central apneas and 0 mixed apneas with a total of 0 apneas and an apnea index (AI) of 0 /hour. There were 19 hypopneas with a hypopnea index of 3.3/hour. The patient also had 0 respiratory event related arousals (RERAs).      The total APNEA/HYPOPNEA INDEX  (AHI) was 3.3 /hour and the total RESPIRATORY DISTURBANCE INDEX was 3.3 .hour  8 events occurred in REM sleep and 11 events in NREM. The REM AHI was 9.5 /hour versus a non-REM AHI of 2.2 /hour.  The patient spent 110 minutes of total sleep time in the supine position and 238 minutes in non-supine. The supine AHI was 10.4, versus a non-supine AHI of 0.0.  OXYGEN SATURATION & C02:  The baseline 02 saturation was 92%, with the lowest being 85%. Time spent below 89% saturation equaled 26 minutes.  PERIODIC LIMB MOVEMENTS: The  patient had a total of 0 Periodic Limb Movements. The Periodic Limb Movement (PLM) index was 0 and the PLM Arousal index was 0 /hour.  Post-study, the patient indicated that sleep was better than usual.   DIAGNOSIS 1. Obstructive Sleep Apnea    PLANS/RECOMMENDATIONS: 1. This study demonstrates improvement of the patient's obstructive sleep apnea with BiPAP therapy. Due to a borderline AHI of 4.7/hour and O2 nadir of 86% on the pressure of 17/13 cm, I will recommend a home BiPAP pressure of 18/14 cm via FFM. The patient should be reminded to be  fully compliant with PAP therapy to improve sleep related symptoms and decrease long term cardiovascular risks. The patient should be reminded, that it may take up to 3 months to get fully used to using PAP with all planned sleep. The earlier full compliance is achieved, the better long term compliance tends to be. Please note that untreated obstructive sleep apnea carries additional perioperative morbidity. Patients with significant obstructive sleep apnea should receive perioperative PAP therapy and the surgeons and particularly the anesthesiologist should be informed of the diagnosis and the severity of the sleep disordered breathing. 2. The patient should be cautioned not to drive, work at heights, or operate dangerous or heavy equipment when tired or sleepy. Review and reiteration of good sleep hygiene measures should be pursued with any patient. 3. The patient will be seen in follow-up by Dr. Rexene Alberts at [facility] for discussion of the test results and further management strategies. The referring provider will be notified of the test results.  I certify that I have reviewed the entire raw data recording prior to the issuance of this report in accordance with the Standards of Accreditation of the American Academy of Sleep Medicine (AASM)       Star Age, MD, PhD Diplomat, American Board of Psychiatry and Neurology (Neurology and Sleep Medicine)

## 2017-06-27 NOTE — Progress Notes (Signed)
Patient referred by Terri Piedra, seen by me on 01/02/17, HST on 02/07/17, last seen by me on 04/24/17, had subsequent abn ONO while on autoPAP, hence the full night titration on 06/26/17:   Please call and inform patient that I have entered an order for treatment with positive airway pressure (PAP) treatment of obstructive sleep apnea (OSA). She did well during the latest sleep study with BiPAP. We will, therefore, I will change her treatment from autoPAP to BiPAP. We will send the order to her DME. I will see the patient back in follow-up in about 10 weeks. Please also explain to the patient that I will be looking out for compliance data, which can be downloaded from the machine (stored on an SD card, that is inserted in the machine) or via remote access through a modem, that is built into the machine. At the time of the followup appointment we will discuss sleep study results and how it is going with PAP treatment at home. Please advise patient to bring Her machine at the time of the first FU visit, even though this is cumbersome. Bringing the machine for every visit after that will likely not be needed, but often helps for the first visit to troubleshoot if needed. Please re-enforce the importance of compliance with treatment and the need for [place] to monitor compliance data - often an insurance requirement and actually good feedback for the patient as far as how they are doing.  Also remind patient, that any interim PAP machine or mask issues should be first addressed with the DME company, as they can often help better with technical and mask fit issues. Please ask if patient has a preference regarding DME company.  Please also make sure, the patient has a follow-up appointment with me in about 10 weeks from the setup date, thanks.  Once you have spoken to the patient - and faxed/routed report to PCP and referring MD (if other than PCP), you can close this encounter, thanks,   Star Age, MD, PhD [facility]

## 2017-06-28 NOTE — Telephone Encounter (Signed)
I called pt. I advised pt that Dr. Rexene Alberts reviewed their sleep study results and found that pt did well with the bipap during her sleep study. Dr. Rexene Alberts recommends that pt change from an autopap to a bipap. I reviewed PAP compliance expectations with the pt. Pt is agreeable to starting a BiPAP. I advised pt that an order will be sent to a DME, [facility], and [facility] will call the pt within about one week after they file with the pt's insurance. [facility] will show the pt how to use the machine, fit for masks, and troubleshoot the BiPAP if needed. A follow up appt was made for insurance purposes with Dr. Rexene Alberts on 09/26/2017 at 8:30am. Pt verbalized understanding to arrive 15 minutes early and bring their BiPAP. A letter with all of this information in it will be mailed to the pt as a reminder. I verified with the pt that the address we have on file is correct. Pt verbalized understanding of results. Pt had no questions at this time but was encouraged to call back if questions arise.

## 2017-06-29 ENCOUNTER — Other Ambulatory Visit: Payer: Self-pay

## 2017-06-29 MED ORDER — FENOFIBRATE 160 MG PO TABS: ORAL_TABLET | ORAL | 0 refills | Status: DC

## 2017-06-29 MED ORDER — GLIPIZIDE ER 2.5 MG PO TB24: ORAL_TABLET | ORAL | 0 refills | Status: DC

## 2017-06-29 MED ORDER — PAROXETINE HCL 40 MG PO TABS: ORAL_TABLET | ORAL | 0 refills | Status: DC

## 2017-07-01 ENCOUNTER — Encounter: Payer: Self-pay | Admitting: Nurse Practitioner

## 2017-07-02 ENCOUNTER — Other Ambulatory Visit: Payer: Self-pay

## 2017-07-02 ENCOUNTER — Telehealth: Payer: Self-pay | Admitting: *Deleted

## 2017-07-02 MED ORDER — DILTIAZEM HCL ER COATED BEADS 240 MG PO CP24: ORAL_CAPSULE | ORAL | 0 refills | Status: DC

## 2017-07-02 MED ORDER — ROPINIROLE HCL 1 MG PO TABS: ORAL_TABLET | ORAL | 0 refills | Status: DC

## 2017-07-02 MED ORDER — FENOFIBRATE 160 MG PO TABS: ORAL_TABLET | ORAL | 1 refills | Status: DC

## 2017-07-02 NOTE — Telephone Encounter (Signed)
Rec'd fax pt needing refill on her Fenofibrate. Reviewed chart pt is up-to-date sent refills to [facility]...Johny Chess

## 2017-07-04 ENCOUNTER — Telehealth: Payer: Self-pay | Admitting: Nurse Practitioner

## 2017-07-04 MED ORDER — FESOTERODINE FUMARATE ER 8 MG PO TB24: 8.0000 mg | ORAL_TABLET | Freq: Every day | ORAL | 1 refills | Status: DC

## 2017-07-04 NOTE — Telephone Encounter (Signed)
07/03/2017 @ 5:31pm [place] Message: Nira Conn with [facility] called requesting a prescription refill for Toviaz (ref# [id number]).  It looks like the prescription was sent to [facility] on 06/26/17. Please advise.

## 2017-07-04 NOTE — Telephone Encounter (Signed)
Rx sent 

## 2017-07-11 ENCOUNTER — Other Ambulatory Visit: Payer: Self-pay | Admitting: *Deleted

## 2017-07-11 MED ORDER — EXENATIDE ER 2 MG/0.85ML ~~LOC~~ AUIJ: 2.0000 mg | AUTO-INJECTOR | SUBCUTANEOUS | 2 refills | Status: DC

## 2017-07-25 ENCOUNTER — Other Ambulatory Visit: Payer: Self-pay | Admitting: Nurse Practitioner

## 2017-07-27 ENCOUNTER — Other Ambulatory Visit: Payer: Self-pay

## 2017-07-27 DIAGNOSIS — K2100 Gastro-esophageal reflux disease with esophagitis, without bleeding: Secondary | ICD-10-CM

## 2017-07-27 MED ORDER — OMEPRAZOLE 10 MG PO CPDR: DELAYED_RELEASE_CAPSULE | ORAL | 0 refills | Status: DC

## 2017-07-30 ENCOUNTER — Other Ambulatory Visit: Payer: Self-pay

## 2017-07-30 MED ORDER — GLIPIZIDE ER 2.5 MG PO TB24: ORAL_TABLET | ORAL | 0 refills | Status: DC

## 2017-08-01 ENCOUNTER — Other Ambulatory Visit: Payer: Self-pay

## 2017-08-01 MED ORDER — GLIPIZIDE ER 5 MG PO TB24: ORAL_TABLET | ORAL | 0 refills | Status: DC

## 2017-08-01 MED ORDER — GLIPIZIDE ER 2.5 MG PO TB24: ORAL_TABLET | ORAL | 0 refills | Status: DC

## 2017-08-02 ENCOUNTER — Telehealth: Payer: Self-pay | Admitting: Nurse Practitioner

## 2017-08-02 MED ORDER — ROPINIROLE HCL 1 MG PO TABS: ORAL_TABLET | ORAL | 1 refills | Status: DC

## 2017-08-02 MED ORDER — PAROXETINE HCL 40 MG PO TABS: ORAL_TABLET | ORAL | 1 refills | Status: DC

## 2017-08-02 NOTE — Telephone Encounter (Signed)
Reviewed chart pt is up-to-date sent updated script electronically to [facility]...Johny Chess

## 2017-08-02 NOTE — Telephone Encounter (Signed)
Copied from Isabel [phone]. Topic: Quick Communication - See Telephone Encounter >> Aug 02, 2017  9:49 AM Cleaster Corin, NT wrote: CRM for notification. See Telephone encounter for:   08/02/17. Rowie from [employer]. Calling to get verbal order for Paxil 40 mg tablet and Requip 1 mg Tablet Rowie can be reached at [phone]

## 2017-09-19 ENCOUNTER — Other Ambulatory Visit: Payer: Self-pay | Admitting: Nurse Practitioner

## 2017-09-19 DIAGNOSIS — K2100 Gastro-esophageal reflux disease with esophagitis, without bleeding: Secondary | ICD-10-CM

## 2017-09-21 ENCOUNTER — Other Ambulatory Visit: Payer: Self-pay

## 2017-09-21 MED ORDER — EXENATIDE ER 2 MG/0.85ML ~~LOC~~ AUIJ: 2.0000 mg | AUTO-INJECTOR | SUBCUTANEOUS | 2 refills | Status: DC

## 2017-09-26 ENCOUNTER — Ambulatory Visit: Payer: 59 | Admitting: Neurology

## 2017-09-26 ENCOUNTER — Encounter: Payer: Self-pay | Admitting: Neurology

## 2017-09-26 VITALS — BP 138/68 | HR 72 | Ht 64.0 in | Wt 243.0 lb

## 2017-09-26 DIAGNOSIS — G4733 Obstructive sleep apnea (adult) (pediatric): Secondary | ICD-10-CM | POA: Diagnosis not present

## 2017-09-26 NOTE — Patient Instructions (Addendum)
Please continue using your BiPAP regularly. While your insurance requires that you use BiPAP at least 4 hours each night on 70% of the nights, I recommend, that you not skip any nights and use it throughout the night if you can. Getting used to BiPAP and staying with the treatment long term does take time and patience and discipline. Untreated obstructive sleep apnea when it is moderate to severe can have an adverse impact on cardiovascular health and raise her risk for heart disease, arrhythmias, hypertension, congestive heart failure, stroke and diabetes. Untreated obstructive sleep apnea causes sleep disruption, nonrestorative sleep, and sleep deprivation. This can have an impact on your day to day functioning and cause daytime sleepiness and impairment of cognitive function, memory loss, mood disturbance, and problems focussing. Using BiPAP regularly can improve these symptoms.  Keep up the good work! We can see you in 6 months, you can see one of our nurse practitioners as you are stable. I will see you after that.

## 2017-09-26 NOTE — Progress Notes (Signed)
Subjective:    Patient ID: Diane Gregory is a 62 y.o. female.  HPI     Interim history:   Diane Gregory is a 62 year old right-handed woman with an underlying medical history of reflux disease, depression, chronic bronchitis, anxiety, arthritis, s/p b/l TKAs in 2015 and 2016, hyperlipidemia, hypertension, restless leg syndrome, type 2 diabetes, and morbid obesity with a BMI of over 40, who presents for follow-up consultation of her obstructive sleep apnea, after recent repeat sleep study with CPAP titration. The patient is unaccompanied today. I last saw her on 04/24/2017, at which time she was compliant with AutoPap therapy. She reported improvement in her sleep quality, sleep consolidation, nocturia. Nevertheless, because of significant desaturations noted on her home sleep test as low as 64% I suggested we proceed with an overnight pulse oximetry test while she is on BiPAP. I reviewed the patient's ONO (overnight pulse oximetry report) from 05/03/17, while on room air and her autoPAP. Her baseline oxygen saturation for the night was 89% and minimum oxygen saturation was 75%. Time below 88% saturation was 2 hours and 23 minutes. Based on the significant residual abnormality I suggested we bring her back in for a full night titration study. She had an overnight titration study on 06/26/2017. Sleep latency was 14.5 minutes, REM latency was delayed at 272 minutes. Sleep efficiency was 88.2%. She was titrated on CPAP which was increased to 15 cm but she had residual desaturations and sleep disordered breathing which is why she was switched to BiPAP of 17/13 towards the end of the night. On this setting her AHI was 4.7 and O2 nadir was 86% during supine non-REM sleep. Based on her residual borderline findings on a BiPAP pressure of 17/13 cm I prescribed a home BiPAP pressure of 18/14. She had no significant PLMS during the study.  Today, 09/26/2017: I reviewed her BiPAP compliance data from 08/26/2017 through  09/24/2017 which is a total of 30 days, during which time she used her BiPAP every night with percent used days greater than 4 hours at 80%, indicating very good compliance with an average usage of 5 hours and 55 minutes, residual AHI at goal at 2.1 per hour, leak acceptable with the 95th percentile at 11.4 L/m on a BiPAP pressure of 18/14 with pressure support of 4. She reports doing well. In fact, she reports that she has been sleeping better since starting BiPAP therapy compared to AutoPap before. She feels that her nocturia is much improved, she has more daytime energy, much less daytime somnolence. She is quite pleased with her outcome. Restless leg symptoms are under control so long as she takes ropinirole and melatonin an hour before her bedtime. She has in the past couple of months increased her ropinirole from Monday milligram to 1.5 mg. She gets the prescription from her PCP and is advised to talk to her primary care provider about the change in the prescription. Previously, in the past, she took Mirapex for years which eventually stopped working for her. Her A1c has improved at the last check. Her blood pressure values are generally good, she is aware of the need for weight loss. She has noticed increase in bloating and belching in the mornings as well as gas. She is advised that this may improve with time as she will swallow less and less air during the night.  The patient's allergies, current medications, family history, past medical history, past social history, past surgical history and problem list were reviewed and updated as appropriate.  Previously (copied from previous notes for reference):    I first met her on 01/02/2017 at the request of her primary care provider, at which time she reported a prior diagnosis of OSA. She also had airway surgery after that. I invited her for sleep study testing. She had a home sleep test only as her insurance denied and attended sleep study. Her home  sleep test from 02/05/2017 indicated at least moderate obstructive sleep apnea with an AHI of 26.3 per hour, O2 nadir of 64%. Her insurance also denied a proper titration study and she started AutoPap therapy at home.   I reviewed her AutoPap compliance data from 03/24/2017 through 04/22/2017 which is a total of 30 days, during which time she used her machine every night with percent used days greater than 4 hours at 80%, indicating good compliance with an average usage of 4 hours and 30 minutes, residual AHI at goal at 1.9 per hour, 95th percentile pressure at 10.9 cm, leak acceptable with the 95th percentile at 14.4 L/m on a pressure of 5-15 cm with EPR.   01/02/2017: (She) reports snoring and excessive daytime somnolence. I reviewed your office note from 11/28/2016. She was previously diagnosed with obstructive sleep apnea, as I understand, but is currently not on CPAP therapy. Prior sleep study results are not available for my review today. Sleep study testing was about 15 years ago. She had airway surgery after that. This included tonsillectomy and correction of deviated septum. Her Epworth sleepiness score is 11 out of 24, fatigue score is 37 out of 63. For her restless leg syndrome she had been on Mirapex for years, but it became less effective. She switched to Requip last year, currently 1 mg at bedtime which she feels has been helpful. The patient is widowed, she has 2 grown children. She lives alone. She works at a [employer], as an Web designer. She is a nonsmoker and drinks alcohol infrequently, maybe once or twice a month, drinks caffeine usually 2-3 servings per day, usually before 4 PM. Her bedtime is generally between 10 and 11 PM. Wakeup time is around 6 AM. She has tried p.m. type medications for occasional difficulty falling asleep but has daytime grogginess. She does have nocturia once or twice per average night, she denies morning headaches. She had some Low back pain. She is  not aware of any formal diagnosis of sleep apnea in her family but her father snored loudly and her brother snores.   Her Past Medical History Is Significant For: Past Medical History:  Diagnosis Date  . Anxiety   . Arthritis    "was in my knees before OR; now in my hands" (05/05/2014)  . Bronchitis   . Chronic bronchitis ([place])    "used to get it q yr; not as much since septal repair in 2004"  . Depression   . GERD (gastroesophageal reflux disease)   . High triglycerides   . HSV infection   . Hyperlipidemia   . Hypertension   . Left knee DJD   . Obesity   . Primary localized osteoarthritis of right knee   . Restless leg syndrome   . Sleep apnea    mild; does not wear CPAP  . Type II diabetes mellitus ([place])   . Urgency of urination    and frequency takes Vesicare  . Urinary incontinence   . Walking pneumonia ~ 2013    Her Past Surgical History Is Significant For: Past Surgical History:  Procedure Laterality Date  .  COLONOSCOPY    . HYSTEROSCOPY  11/94   submuous myomas  . KNEE ARTHROSCOPY Right ~ 2010  . KNEE ARTHROSCOPY W/ MENISCECTOMY Right 2010  . NASAL SEPTUM SURGERY    . REFRACTIVE SURGERY Bilateral ~ 2005  . TONSILLECTOMY  ~ 2004  . TOTAL KNEE ARTHROPLASTY Left 08/11/2013   Procedure: LEFT TOTAL KNEE ARTHROPLASTY;  Surgeon: Lorn Junes, MD;  Location: [facility];  Service: Orthopedics;  Laterality: Left;  . TOTAL KNEE ARTHROPLASTY Right 05/04/2014   Procedure: RIGHT TOTAL KNEE ARTHROPLASTY;  Surgeon: Lorn Junes, MD;  Location: [facility];  Service: Orthopedics;  Laterality: Right;  . UVULOPALATOPHARYNGOPLASTY, TONSILLECTOMY AND SEPTOPLASTY  2004  . VAGINAL HYSTERECTOMY  06/1993    Her Family History Is Significant For: Family History  Problem Relation Age of Onset  . Heart disease Mother   . Hypertension Mother   . Stroke Mother   . Breast cancer Mother   . Diabetes Mellitus II Father   . Cancer Father        CLL  . Hypertension Brother   . Breast cancer  Paternal Grandmother   . Breast cancer Maternal Grandmother     Her Social History Is Significant For: Social History   Socioeconomic History  . Marital status: Widowed    Spouse name: None  . Number of children: 2  . Years of education: 59  . Highest education level: None  Social Needs  . Financial resource strain: None  . Food insecurity - worry: None  . Food insecurity - inability: None  . Transportation needs - medical: No  . Transportation needs - non-medical: No  Occupational History  . Occupation: Intake Coordinator    Comment: administration  Tobacco Use  . Smoking status: Never Smoker  . Smokeless tobacco: Never Used  Substance and Sexual Activity  . Alcohol use: Yes    Alcohol/week: 0.6 oz    Types: 1 Standard drinks or equivalent per week    Comment: 05/05/2014 "might have a drink once/month"  . Drug use: No  . Sexual activity: Not Currently    Partners: Male    Birth control/protection: Surgical    Comment: [facility] 1994  Other Topics Concern  . None  Social History Narrative   Currently single. Fun: Crafts, flea markets, antiquing, travel, reading   Denies religious beliefs that effect health care.    Caffeine: 2-3 drinks a day     Her Allergies Are:  Allergies  Allergen Reactions  . Celebrex [Celecoxib] Swelling  . Lisinopril Cough  . Meloxicam Swelling  . Nsaids Swelling  . Penicillins Other (See Comments)    Drop in blood pressure  . Statins Other (See Comments)    Myalgias, edema  . Lidocaine Rash    "bad taste in mouth" with dental work  :   Her Current Medications Are:  Outpatient Encounter Medications as of 09/26/2017  Medication Sig  . blood glucose meter kit and supplies KIT Dispense based on patient and insurance preference. Use up to four times daily as directed. Dx code: E11.65  . diltiazem (CARTIA XT) 240 MG 24 hr capsule TAKE 1 CAPSULE BY MOUTH EVERY DAY  . Exenatide ER (BYDUREON BCISE) 2 MG/0.85ML AUIJ Inject 2 mg as directed once a  week.  . fenofibrate 160 MG tablet TAKE 1 TABLET(160 MG) BY MOUTH DAILY  . fesoterodine (TOVIAZ) 8 MG TB24 tablet Take 1 tablet (8 mg total) by mouth daily.  . fluticasone furoate-vilanterol (BREO ELLIPTA) 100-25 MCG/INH AEPB Inhale 1  puff into the lungs daily.  Marland Kitchen glipiZIDE (GLUCOTROL XL) 2.5 MG 24 hr tablet TAKE 1 TABLET(2.5 MG) BY MOUTH DAILY WITH BREAKFAST  . glipiZIDE (GLUCOTROL XL) 5 MG 24 hr tablet TAKE 1 TABLET(5 MG) BY MOUTH DAILY WITH BREAKFAST  . glucose blood (ONE TOUCH ULTRA TEST) test strip Use as instructed  . Lancets Misc. (ONE TOUCH SURESOFT) MISC Use 1 lancet per test. Test blood sugars 1-4 times per day as instructed.  Marland Kitchen losartan-hydrochlorothiazide (HYZAAR) 100-25 MG tablet Take 1 tablet daily by mouth.  . Melatonin 1 MG TABS Take 1 mg by mouth at bedtime.  . metFORMIN (GLUCOPHAGE-XR) 500 MG 24 hr tablet TAKE 2 TABLETS BY MOUTH DAILY WITH BREAKFAST  . Misc Natural Products (OSTEO BI-FLEX ADV JOINT SHIELD) TABS   . Multiple Vitamin (MULTIVITAMIN WITH MINERALS) TABS tablet Take 1 tablet by mouth daily.  Marland Kitchen omeprazole (PRILOSEC) 10 MG capsule TAKE ONE CAPSULE BY MOUTH  TWICE DAILY 30 MIN BEFORE  BKFT AND 30 MIN BEFORE EVE  MEAL FOR 8 WEEKS  . PARoxetine (PAXIL) 40 MG tablet TAKE 1 TABLET(40 MG) BY MOUTH EVERY MORNING  . rOPINIRole (REQUIP) 1 MG tablet TAKE 1 TABLET(1 MG) BY MOUTH AT BEDTIME  . [DISCONTINUED] aspirin 81 MG tablet Take 81 mg by mouth daily.   No facility-administered encounter medications on file as of 09/26/2017.   :  Review of Systems:  Out of a complete 14 point review of systems, all are reviewed and negative with the exception of these symptoms as listed below: Review of Systems  Neurological:       Pt presents today to discuss her bipap. Pt says her bipap is going well.    Objective:  Neurological Exam  Physical Exam Physical Examination:   Vitals:   09/26/17 0821  BP: 138/68  Pulse: 72   General Examination: The patient is a very pleasant 62  y.o. female in no acute distress. She appears well-developed and well-nourished and well groomed.   HEENT:Normocephalic, atraumatic, pupils are equal, round and reactive to light and accommodation. Extraocular tracking is good without limitation to gaze excursion or nystagmus noted. Normal smooth pursuit is noted. Hearing is grossly intact. Face is symmetric with normal facial animation and normal facial sensation. Speech is clear with no dysarthria noted. There is no hypophonia. There is no lip, neck/head, jaw or voice tremor. Neck is supple with full range of passive and active motion. There are no carotid bruits on auscultation. Oropharynx exam reveals: mildto moderate mouth dryness, adequatedental hygiene and mildairway crowding.   Chest:Clear to auscultation without wheezing, rhonchi or crackles noted.  Heart:S1+S2+0, regular and normal without murmurs, rubs or gallops noted.   Abdomen:Soft, non-tender and non-distended with normal bowel sounds appreciated on auscultation.  Extremities:There is nopitting edema in the distal lower extremities bilaterally. Pedal pulses are intact.  Skin: Warm and dry without trophic changes noted.  Musculoskeletal: exam reveals no obvious joint deformities, tenderness or joint swelling or erythema, R knee slightly bigger than L, stable.  Neurologically:   Mental status: The patient is awake, alert and oriented in all 4 spheres. Herimmediate and remote memory, attention, language skills and fund of knowledge are appropriate. There is no evidence of aphasia, agnosia, apraxia or anomia. Speech is clear with normal prosody and enunciation. Thought process is linear. Mood is normaland affect is normal.  Cranial nerves II - XII are as described above under HEENT exam. In addition: shoulder shrug is normal with equal shoulder height noted. Motor exam: Normal  bulk, strength and tone is noted. There is no drift, tremor or rebound. Reflexes are 1-2+  throughout, absent in both knees. Fine motor skills and coordination: intact with normal finger taps, normal hand movements, normal rapid alternating patting, normal foot taps and normal foot agility.  Cerebellar testing: No dysmetria or intention tremor on finger to nose testing. There is no truncal or gait ataxia.  Sensory exam: intact to light touch in the upper and lower extremities.  Gait, station and balance: Shestands easily. No veering to one side is noted. No leaning to one side is noted. Posture is age-appropriate and stance is narrow based. Gait shows normalstride length and normalpace. No problems turning are noted. Tandem walk is good.  Assessment and Plan:  In summary, Diane Gregory a very pleasant 62 year old female with an underlying medical history of reflux disease, depression, chronic bronchitis, anxiety, arthritis, s/p b/l TKAs in 2015 and 2016, hyperlipidemia, hypertension, restless leg syndrome, type 2 diabetes, and morbid obesity with a BMI of over 40, who presents for follow-up consultation of her obstructive sleep apnea, after recent titration study, during which she did well on BiPAP. She had significant desaturations as noted during an ONO, while she was still on autoPAP. She is compliant with BiPAP therapy and is commended for her treatment adherence. She is encouraged to stay well hydrated and work on weight loss. She is advised that her swallowing can be a problem that can cause uncomfortable gas and belching. She may notice an improvement with time. Thankfully, she has clearly benefited from BiPAP therapy and that she notices any further improvement in her nocturia and daytime somnolence when changing from AutoPap to BiPAP therapy. She remembers that she still woke up not fully rested although she did indicate an improvement when she originally started AutoPap therapy. I suggested a six-month follow-up at this juncture, she can see one of our nurse practitioners next time.  I answered all her questions today and the patient was in agreement. I spent 25 minutes in total face-to-face time with the patient, more than 50% of which was spent in counseling and coordination of care, reviewing test results, reviewing medication and discussing or reviewing the diagnosis of OSA, its prognosis and treatment options. Pertinent laboratory and imaging test results that were available during this visit with the patient were reviewed by me and considered in my medical decision making (see chart for details).

## 2017-09-29 ENCOUNTER — Encounter: Payer: Self-pay | Admitting: Nurse Practitioner

## 2017-09-29 ENCOUNTER — Other Ambulatory Visit: Payer: Self-pay | Admitting: Nurse Practitioner

## 2017-10-01 MED ORDER — METFORMIN HCL ER 500 MG PO TB24: ORAL_TABLET | ORAL | 0 refills | Status: DC

## 2017-10-02 ENCOUNTER — Encounter: Payer: Self-pay | Admitting: Nurse Practitioner

## 2017-10-02 DIAGNOSIS — G2581 Restless legs syndrome: Secondary | ICD-10-CM

## 2017-10-02 MED ORDER — ROPINIROLE HCL 1 MG PO TABS: ORAL_TABLET | ORAL | 1 refills | Status: DC

## 2017-10-22 ENCOUNTER — Ambulatory Visit: Payer: 59 | Admitting: Neurology

## 2017-11-13 ENCOUNTER — Other Ambulatory Visit: Payer: Self-pay

## 2017-11-13 MED ORDER — GLIPIZIDE ER 2.5 MG PO TB24
ORAL_TABLET | ORAL | 0 refills | Status: DC
Start: 2017-11-13 — End: 2017-12-26

## 2017-11-13 MED ORDER — GLIPIZIDE ER 5 MG PO TB24: ORAL_TABLET | ORAL | 0 refills | Status: DC

## 2017-11-24 ENCOUNTER — Other Ambulatory Visit: Payer: Self-pay | Admitting: Nurse Practitioner

## 2017-12-19 ENCOUNTER — Ambulatory Visit: Payer: 59 | Admitting: Nurse Practitioner

## 2017-12-21 ENCOUNTER — Other Ambulatory Visit: Payer: Self-pay

## 2017-12-21 ENCOUNTER — Ambulatory Visit: Payer: 59 | Admitting: Nurse Practitioner

## 2017-12-21 ENCOUNTER — Other Ambulatory Visit (INDEPENDENT_AMBULATORY_CARE_PROVIDER_SITE_OTHER): Payer: 59

## 2017-12-21 DIAGNOSIS — R319 Hematuria, unspecified: Secondary | ICD-10-CM | POA: Diagnosis not present

## 2017-12-21 DIAGNOSIS — E1165 Type 2 diabetes mellitus with hyperglycemia: Secondary | ICD-10-CM

## 2017-12-21 LAB — URINALYSIS
BILIRUBIN URINE: NEGATIVE
Hgb urine dipstick: NEGATIVE
Ketones, ur: NEGATIVE
Leukocytes, UA: NEGATIVE
Nitrite: NEGATIVE
Specific Gravity, Urine: 1.01 (ref 1.000–1.030)
TOTAL PROTEIN, URINE-UPE24: NEGATIVE
URINE GLUCOSE: NEGATIVE
UROBILINOGEN UA: 0.2 (ref 0.0–1.0)
pH: 6 (ref 5.0–8.0)

## 2017-12-21 LAB — HEMOGLOBIN A1C: HEMOGLOBIN A1C: 6.5 % (ref 4.6–6.5)

## 2017-12-22 LAB — URINE CULTURE
MICRO NUMBER: 90573016
SPECIMEN QUALITY:: ADEQUATE

## 2017-12-26 ENCOUNTER — Ambulatory Visit: Payer: 59 | Admitting: Nurse Practitioner

## 2017-12-26 ENCOUNTER — Encounter: Payer: Self-pay | Admitting: Nurse Practitioner

## 2017-12-26 VITALS — BP 112/64 | HR 84 | Temp 98.8°F | Resp 16 | Ht 64.0 in | Wt 241.0 lb

## 2017-12-26 DIAGNOSIS — E1165 Type 2 diabetes mellitus with hyperglycemia: Secondary | ICD-10-CM

## 2017-12-26 DIAGNOSIS — G2581 Restless legs syndrome: Secondary | ICD-10-CM | POA: Diagnosis not present

## 2017-12-26 DIAGNOSIS — N390 Urinary tract infection, site not specified: Secondary | ICD-10-CM | POA: Diagnosis not present

## 2017-12-26 DIAGNOSIS — E782 Mixed hyperlipidemia: Secondary | ICD-10-CM | POA: Diagnosis not present

## 2017-12-26 DIAGNOSIS — R319 Hematuria, unspecified: Secondary | ICD-10-CM | POA: Diagnosis not present

## 2017-12-26 DIAGNOSIS — I10 Essential (primary) hypertension: Secondary | ICD-10-CM

## 2017-12-26 MED ORDER — FESOTERODINE FUMARATE ER 8 MG PO TB24: 8.0000 mg | ORAL_TABLET | Freq: Every day | ORAL | 1 refills | Status: DC

## 2017-12-26 MED ORDER — GLIPIZIDE ER 2.5 MG PO TB24: ORAL_TABLET | ORAL | 0 refills | Status: DC

## 2017-12-26 MED ORDER — ROPINIROLE HCL 2 MG PO TABS: ORAL_TABLET | ORAL | 2 refills | Status: DC

## 2017-12-26 MED ORDER — GLIPIZIDE ER 5 MG PO TB24: ORAL_TABLET | ORAL | 1 refills | Status: DC

## 2017-12-26 MED ORDER — LOSARTAN POTASSIUM-HCTZ 100-25 MG PO TABS: 1.0000 | ORAL_TABLET | Freq: Every day | ORAL | 1 refills | Status: DC

## 2017-12-26 MED ORDER — FENOFIBRATE 160 MG PO TABS: ORAL_TABLET | ORAL | 1 refills | Status: DC

## 2017-12-26 MED ORDER — EXENATIDE ER 2 MG/0.85ML ~~LOC~~ AUIJ: 2.0000 mg | AUTO-INJECTOR | SUBCUTANEOUS | 2 refills | Status: DC

## 2017-12-26 MED ORDER — ROPINIROLE HCL 1 MG PO TABS: ORAL_TABLET | ORAL | 1 refills | Status: DC

## 2017-12-26 MED ORDER — GLIPIZIDE ER 2.5 MG PO TB24: ORAL_TABLET | ORAL | 1 refills | Status: DC

## 2017-12-26 MED ORDER — PAROXETINE HCL 40 MG PO TABS: ORAL_TABLET | ORAL | 1 refills | Status: DC

## 2017-12-26 MED ORDER — GLUCOSE BLOOD VI STRP: ORAL_STRIP | 12 refills | Status: DC

## 2017-12-26 MED ORDER — DILTIAZEM HCL ER COATED BEADS 240 MG PO CP24: ORAL_CAPSULE | ORAL | 1 refills | Status: DC

## 2017-12-26 NOTE — Assessment & Plan Note (Signed)
Continue current dosage of requip F/u for new, worsening symptoms- information printed in AVS - rOPINIRole (REQUIP) 2 MG tablet; Take 1 tablet at bedtime  Dispense: 90 tablet; Refill: 2

## 2017-12-26 NOTE — Patient Instructions (Addendum)
Please return to the lab downstairs for labwork when fasting- only water or black coffee for 8 hours prior  Please return in about 6 months for routine follow up and your annual physical.  It was good to see you. Thanks for letting me take care of you today :)  Restless Legs Syndrome Restless legs syndrome is a condition that causes uncomfortable feelings or sensations in the legs, especially while sitting or lying down. The sensations usually cause an overwhelming urge to move the legs. The arms can also sometimes be affected. The condition can range from mild to severe. The symptoms often interfere with a person's ability to sleep. What are the causes? The cause of this condition is not known. What increases the risk? This condition is more likely to develop in:  People who are older than age 31.  Pregnant women. In general, restless legs syndrome is more common in women than in men.  People who have a family history of the condition.  People who have certain medical conditions, such as iron deficiency, kidney disease, Parkinson disease, or nerve damage.  People who take certain medicines, such as medicines for high blood pressure, nausea, colds, allergies, depression, and some heart conditions.  What are the signs or symptoms? The main symptom of this condition is uncomfortable sensations in the legs. These sensations may be:  Described as pulling, tingling, prickling, throbbing, crawling, or burning.  Worse while you are sitting or lying down.  Worse during periods of rest or inactivity.  Worse at night, often interfering with your sleep.  Accompanied by a very strong urge to move your legs.  Temporarily relieved by movement of your legs.  The sensations usually affect both sides of the body. The arms can also be affected, but this is rare. People who have this condition often have tiredness during the day because of their lack of sleep at night. How is this  diagnosed? This condition may be diagnosed based on your description of the symptoms. You may also have tests, including blood tests, to check for other conditions that may lead to your symptoms. In some cases, you may be asked to spend some time in a sleep lab so your sleeping can be monitored. How is this treated? Treatment for this condition is focused on managing the symptoms. Treatment may include:  Self-help and lifestyle changes.  Medicines.  Follow these instructions at home:  Take medicines only as directed by your health care provider.  Try these methods to get temporary relief from the uncomfortable sensations: ? Massage your legs. ? Walk or stretch. ? Take a cold or hot bath.  Practice good sleep habits. For example, go to bed and get up at the same time every day.  Exercise regularly.  Practice ways of relaxing, such as yoga or meditation.  Avoid caffeine and alcohol.  Do not use any tobacco products, including cigarettes, chewing tobacco, or electronic cigarettes. If you need help quitting, ask your health care provider.  Keep all follow-up visits as directed by your health care provider. This is important. Contact a health care provider if: Your symptoms do not improve with treatment, or they get worse. This information is not intended to replace advice given to you by your health care provider. Make sure you discuss any questions you have with your health care provider. Document Released: 07/21/2002 Document Revised: 01/06/2016 Document Reviewed: 07/27/2014 Elsevier Interactive Patient Education  [employer].

## 2017-12-26 NOTE — Assessment & Plan Note (Signed)
Stable, continue current meds F/U in 6 months - glucose blood (ONE TOUCH ULTRA TEST) test strip; Use as instructed  Dispense: 100 each; Refill: 12 - Comprehensive metabolic panel; Future

## 2017-12-26 NOTE — Progress Notes (Signed)
Name: Diane Gregory   MRN: [id number]    DOB: 29-Aug-1955   Date:12/26/2017       Progress Note  Subjective  Chief Complaint  Chief Complaint  Patient presents with  . Follow-up    HPI She is here today for a follow up of chronic conditions including HTN, DM, HLD, and restless legs. We will also follow up on a recent UTI.  Hypertension -maintained on cardizem 240, hyzaar 100-25 daily Reports daily medication compliance without adverse medication effects. Reports she does not routinely check her BP readings Denies headaches, vision changes, chest pain, shortness of breath, edema.  BP Readings from Last 3 Encounters:  12/26/17 112/64  09/26/17 138/68  06/22/17 122/68   Diabetes- maintained on  exanatide ER 2g weekly, glipizide 7.5 daily, metformin 1065m daily  Reports daily medication compliance without noted adverse medication effects. Reports she does not routinely check her blood sugars at home. Denies tremor, diaphoresis, polyuria, polydipsia, polyphagia.  Lab Results  Component Value Date   HGBA1C 6.5 12/21/2017   Cholesterol- maintained on fenofibrate 160 daily Hx of intolerance to statins and has been maintained on fenofibrate instead  Lab Results  Component Value Date   CHOL 175 06/22/2017   HDL 39.90 06/22/2017   LDLCALC 101 (H) 06/22/2017   TRIG 170.0 (H) 06/22/2017   CHOLHDL 4 06/22/2017   Restless legs- she has increased her dosage of requip to 2 mg 1 hour before bedtime due to worsening restless legs symptoms The new dosage is working well and she has not noted any adverse effects  Urinary issues- [facility]antoin course for UTI  She feels much better and says all symptoms have resolved but was told to have her urine rechecked due to blood in her urine on the testing [facility] Denies fevers, abdominal pain, nausea,vomiting, hematuria, dysuria.   Patient Active Problem List   Diagnosis  Date Noted  . Encounter for general adult medical examination with abnormal findings 06/22/2017  . History of anemia 06/22/2017  . Acute upper respiratory infection 04/20/2017  . De Quervain's tenosynovitis, right 03/01/2016  . Overactive bladder 01/24/2016  . Severe obesity (BMI >= 40) ([place] 07/05/2015  . Anxiety and depression 11/18/2014  . S/P left total knee replacement   . Right knee DJD   . Primary localized osteoarthritis of right knee   . DJD (degenerative joint disease) of knee 08/11/2013  . Diabetes mellitus, type 2 ([facility]   . Hypertension   . Restless leg syndrome   . GERD (gastroesophageal reflux disease)   . High triglycerides   . Hyperlipidemia   . Sleep apnea   . Left knee DJD     Past Surgical History:  Procedure Laterality Date  . COLONOSCOPY    . HYSTEROSCOPY  11/94   submuous myomas  . KNEE ARTHROSCOPY Right ~ 2010  . KNEE ARTHROSCOPY W/ MENISCECTOMY Right 2010  . NASAL SEPTUM SURGERY    . REFRACTIVE SURGERY Bilateral ~ 2005  . TONSILLECTOMY  ~ 2004  . TOTAL KNEE ARTHROPLASTY Left 08/11/2013   Procedure: LEFT TOTAL KNEE ARTHROPLASTY;  Surgeon: RLorn Junes MD;  [facility]  Service: Orthopedics;  Laterality: Left;  . TOTAL KNEE ARTHROPLASTY Right 05/04/2014   Procedure: RIGHT TOTAL KNEE ARTHROPLASTY;  Surgeon: RLorn Junes MD;  [facility]  Service: Orthopedics;  Laterality: Right;  . UVULOPALATOPHARYNGOPLASTY, TONSILLECTOMY AND SEPTOPLASTY  2004  . VAGINAL HYSTERECTOMY  06/1993    Family History  Problem Relation Age of Onset  . Heart disease Mother   . Hypertension Mother   . Stroke Mother   . Breast cancer Mother   . Diabetes Mellitus II Father   . Cancer Father        CLL  . Hypertension Brother   . Breast cancer Paternal Grandmother   . Breast cancer Maternal Grandmother     Social History   Socioeconomic History  . Marital status: Widowed    Spouse name: Not on file  . Number of children: 2  . Years of education: 72   . Highest education level: Not on file  Occupational History  . Occupation: Intake Coordinator    Comment: administration  Social Needs  . Financial resource strain: Not on file  . Food insecurity:    Worry: Not on file    Inability: Not on file  . Transportation needs:    Medical: No    Non-medical: No  Tobacco Use  . Smoking status: Never Smoker  . Smokeless tobacco: Never Used  Substance and Sexual Activity  . Alcohol use: Yes    Alcohol/week: 0.6 oz    Types: 1 Standard drinks or equivalent per week    Comment: 05/05/2014 "might have a drink once/month"  . Drug use: No  . Sexual activity: Not Currently    Partners: Male    Birth control/protection: Surgical    Comment: TVH 1994  Lifestyle  . Physical activity:    Days per week: Not on file    Minutes per session: Not on file  . Stress: Not on file  Relationships  . Social connections:    Talks on phone: Not on file    Gets together: Not on file    Attends religious service: Not on file    Active member of club or organization: Not on file    Attends meetings of clubs or organizations: Not on file    Relationship status: Not on file  . Intimate partner violence:    Fear of current or ex partner: Not on file    Emotionally abused: Not on file    Physically abused: Not on file    Forced sexual activity: Not on file  Other Topics Concern  . Not on file  Social History Narrative   Currently single. Fun: Crafts, flea markets, antiquing, travel, reading   Denies religious beliefs that effect health care.    Caffeine: 2-3 drinks a day      Current Outpatient Medications:  .  blood glucose meter kit and supplies KIT, Dispense based on patient and insurance preference. Use up to four times daily as directed. Dx code: E11.65, Disp: 1 each, Rfl: 0 .  diltiazem (CARDIZEM CD) 240 MG 24 hr capsule, Take 1 capsule by mouth daily., Disp: 90 capsule, Rfl: 1 .  Exenatide ER (BYDUREON BCISE) 2 MG/0.85ML AUIJ, Inject 2 mg as  directed once a week., Disp: 12 pen, Rfl: 2 .  fenofibrate 160 MG tablet, TAKE 1 TABLET BY MOUTH  DAILY, Disp: 90 tablet, Rfl: 1 .  fesoterodine (TOVIAZ) 8 MG TB24 tablet, Take 1 tablet (8 mg total) by mouth daily., Disp: 90 tablet, Rfl: 1 .  fluticasone furoate-vilanterol (BREO ELLIPTA) 100-25 MCG/INH AEPB, Inhale 1 puff into the lungs daily., Disp: 28 each, Rfl: 0 .  glipiZIDE (GLUCOTROL XL) 2.5 MG 24 hr tablet, TAKE 1 TABLET(2.5 MG) BY MOUTH DAILY WITH BREAKFAST, Disp: 90 tablet, Rfl: 1 .  glipiZIDE (GLUCOTROL XL) 5 MG 24 hr tablet, TAKE 1 TABLET BY MOUTH  DAILY WITH BREAKFAST, Disp: 90 tablet, Rfl: 1 .  glucose blood (ONE TOUCH ULTRA TEST) test strip, Use as instructed, Disp: 100 each, Rfl: 12 .  Lancets Misc. (ONE TOUCH SURESOFT) MISC, Use 1 lancet per test. Test blood sugars 1-4 times per day as instructed., Disp: 1 each, Rfl: 1 .  losartan-hydrochlorothiazide (HYZAAR) 100-25 MG tablet, Take 1 tablet by mouth daily., Disp: 90 tablet, Rfl: 1 .  Melatonin 1 MG TABS, Take 1 mg by mouth at bedtime., Disp: , Rfl:  .  metFORMIN (GLUCOPHAGE-XR) 500 MG 24 hr tablet, TAKE 2 TABLETS BY MOUTH  DAILY WITH BREAKFAST, Disp: 180 tablet, Rfl: 0 .  Misc Natural Products (OSTEO BI-FLEX ADV JOINT SHIELD) TABS, , Disp: , Rfl:  .  Multiple Vitamin (MULTIVITAMIN WITH MINERALS) TABS tablet, Take 1 tablet by mouth daily., Disp: , Rfl:  .  omeprazole (PRILOSEC) 10 MG capsule, TAKE ONE CAPSULE BY MOUTH  TWICE DAILY 30 MIN BEFORE  BKFT AND 30 MIN BEFORE EVE  MEAL FOR 8 WEEKS, Disp: 180 capsule, Rfl: 0 .  PARoxetine (PAXIL) 40 MG tablet, TAKE 1 TABLET(40 MG) BY MOUTH EVERY MORNING, Disp: 90 tablet, Rfl: 1 .  rOPINIRole (REQUIP) 1 MG tablet, TAKE 1.5 TABLETS (1.5MG) BY MOUTH AT BEDTIME, Disp: 135 tablet, Rfl: 1  Allergies  Allergen Reactions  . Celebrex [Celecoxib] Swelling  . Lisinopril Cough  . Meloxicam Swelling  . Nsaids Swelling  . Penicillins Other (See Comments)    Drop in blood pressure  . Statins Other  (See Comments)    Myalgias, edema  . Lidocaine Rash    "bad taste in mouth" with dental work     ROS See HPI  Objective  Vitals:   12/26/17 1001  BP: 112/64  Pulse: 84  Resp: 16  Temp: 98.8 F (37.1 C)  TempSrc: Oral  SpO2: 96%  Weight: 241 lb (109.3 kg)  Height: _0  (1.626 m)    Body mass index is 41.37 kg/m.  Physical Exam Vital signs reviewed Constitutional: Patient appears well-developed and well-nourished. No distress.  HENT: Head: Normocephalic and atraumatic. Nose: Nose normal. Mouth/Throat: Oropharynx is clear and moist. No oropharyngeal exudate.  Eyes: Conjunctivae and EOM are normal. Pupils are equal, round, and reactive to light. No scleral icterus.  Neck: Normal range of motion. Neck supple.  Cardiovascular: Normal rate, regular rhythm and normal heart sounds.  No murmur heard. No BLE edema. Distal pulses intact. Pulmonary/Chest: Effort normal and breath sounds normal. No respiratory distress. Abdominal: Soft. No distension Musculoskeletal: Normal range of motion, No gross deformities Neurological: She is alert and oriented to person, place, and time. Coordination, balance, strength, speech and gait are normal.  Skin: Skin is warm and dry. No rash noted. No erythema.  Psychiatric: Patient has a normal mood and affect. behavior is normal. Judgment and thought content normal.   Assessment & Plan RTC in 6 months for CPE; F/U: DM  Urinary tract infection with hematuria, site unspecified Follow up Urinalysis and culture obtained last week on 12/21/17 WNL F/U for new, worsening symptoms

## 2017-12-26 NOTE — Assessment & Plan Note (Signed)
Stable, continue current medications - Comprehensive metabolic panel; Future

## 2017-12-26 NOTE — Assessment & Plan Note (Signed)
Update lipid panel today, she was instructed to work on healthy diet and exercise after last lipid panel on 11/9 May need to consider altering medication therapy if triglycerides remain elevated - Lipid panel; Future

## 2018-01-03 LAB — HM DIABETES EYE EXAM

## 2018-01-08 ENCOUNTER — Encounter: Payer: Self-pay | Admitting: Nurse Practitioner

## 2018-01-09 ENCOUNTER — Encounter: Payer: Self-pay | Admitting: Obstetrics and Gynecology

## 2018-01-09 ENCOUNTER — Other Ambulatory Visit: Payer: Self-pay

## 2018-01-09 ENCOUNTER — Ambulatory Visit (INDEPENDENT_AMBULATORY_CARE_PROVIDER_SITE_OTHER): Payer: 59 | Admitting: Obstetrics and Gynecology

## 2018-01-09 VITALS — BP 110/62 | HR 88 | Resp 16 | Ht 63.5 in | Wt 243.0 lb

## 2018-01-09 DIAGNOSIS — Z01419 Encounter for gynecological examination (general) (routine) without abnormal findings: Secondary | ICD-10-CM

## 2018-01-09 DIAGNOSIS — E1165 Type 2 diabetes mellitus with hyperglycemia: Secondary | ICD-10-CM

## 2018-01-09 DIAGNOSIS — Z9189 Other specified personal risk factors, not elsewhere classified: Secondary | ICD-10-CM | POA: Diagnosis not present

## 2018-01-09 DIAGNOSIS — Z1231 Encounter for screening mammogram for malignant neoplasm of breast: Secondary | ICD-10-CM | POA: Diagnosis not present

## 2018-01-09 MED ORDER — GLUCOSE BLOOD VI STRP: ORAL_STRIP | 12 refills | Status: DC

## 2018-01-09 NOTE — Progress Notes (Signed)
62 y.o. G63P2002 Widowed Caucasian female here for annual exam.    Now using CPAP.   On [place] for urgency, and this is helping.  Can have leakage at night sometimes, but up less often now. Did PT for a little while but had difficulty with time available to do this.  2 - 3 cups of coffee in the am, occasional Diet Coke or tea.  Feels more secure about her bladder and satisfied with her care.  HgbA1C is 6.5.  Did not do genetic counseling for FH breast cancer.   PCP:  Brien Few, NP   Patient's last menstrual period was 08/14/1994 (within months).           Sexually active: No.  The current method of family planning is status post hysterectomy -- ovaries remain.    Exercising: Yes.    walking Smoker:  no  Health Maintenance: Pap:  2000 Negative History of abnormal Pap:  no MMG:  12/19/17  Colonoscopy:  05-12-15 polyp with Dr. Monna Fam due 04/2025 BMD:   2007  Result  Normal TDaP:  2018 Gardasil:   n/a HIV: negative years ago  Hep C: 03/05/14 Negative Screening Labs: PCP   reports that she has never smoked. She has never used smokeless tobacco. She reports that she drinks about 0.6 oz of alcohol per week. She reports that she does not use drugs.  Past Medical History:  Diagnosis Date  . Anxiety   . Arthritis    "was in my knees before OR; now in my hands" (05/05/2014)  . Bronchitis   . Chronic bronchitis ([place])    "used to get it q yr; not as much since septal repair in 2004"  . Depression   . GERD (gastroesophageal reflux disease)   . High triglycerides   . HSV infection   . Hyperlipidemia   . Hypertension   . Left knee DJD   . Obesity   . Primary localized osteoarthritis of right knee   . Restless leg syndrome   . Sleep apnea    mild; does not wear CPAP  . Type II diabetes mellitus ([place])   . Urgency of urination    and frequency takes Vesicare  . Urinary incontinence   . Walking pneumonia ~ 2013    Past Surgical History:  Procedure Laterality Date  .  COLONOSCOPY    . HYSTEROSCOPY  11/94   submuous myomas  . KNEE ARTHROSCOPY Right ~ 2010  . KNEE ARTHROSCOPY W/ MENISCECTOMY Right 2010  . NASAL SEPTUM SURGERY    . REFRACTIVE SURGERY Bilateral ~ 2005  . TONSILLECTOMY  ~ 2004  . TOTAL KNEE ARTHROPLASTY Left 08/11/2013   Procedure: LEFT TOTAL KNEE ARTHROPLASTY;  Surgeon: Lorn Junes, MD;  Location: [facility];  Service: Orthopedics;  Laterality: Left;  . TOTAL KNEE ARTHROPLASTY Right 05/04/2014   Procedure: RIGHT TOTAL KNEE ARTHROPLASTY;  Surgeon: Lorn Junes, MD;  Location: [facility];  Service: Orthopedics;  Laterality: Right;  . UVULOPALATOPHARYNGOPLASTY, TONSILLECTOMY AND SEPTOPLASTY  2004  . VAGINAL HYSTERECTOMY  06/1993    Current Outpatient Medications  Medication Sig Dispense Refill  . Ascorbic Acid (VITAMIN C PO) Take by mouth daily.    . blood glucose meter kit and supplies KIT Dispense based on patient and insurance preference. Use up to four times daily as directed. Dx code: E11.65 1 each 0  . diltiazem (CARDIZEM CD) 240 MG 24 hr capsule Take 1 capsule by mouth daily. 90 capsule 1  . Exenatide ER (BYDUREON  BCISE) 2 MG/0.85ML AUIJ Inject 2 mg as directed once a week. 12 pen 2  . fenofibrate 160 MG tablet TAKE 1 TABLET BY MOUTH  DAILY 90 tablet 1  . fesoterodine (TOVIAZ) 8 MG TB24 tablet Take 1 tablet (8 mg total) by mouth daily. 90 tablet 1  . glipiZIDE (GLUCOTROL XL) 2.5 MG 24 hr tablet TAKE 1 TABLET(2.5 MG) BY MOUTH DAILY WITH BREAKFAST 90 tablet 1  . glipiZIDE (GLUCOTROL XL) 5 MG 24 hr tablet TAKE 1 TABLET BY MOUTH  DAILY WITH BREAKFAST 90 tablet 1  . glucose blood (ONE TOUCH ULTRA TEST) test strip Use as instructed 100 each 12  . Lancets Misc. (ONE TOUCH SURESOFT) MISC Use 1 lancet per test. Test blood sugars 1-4 times per day as instructed. 1 each 1  . losartan-hydrochlorothiazide (HYZAAR) 100-25 MG tablet Take 1 tablet by mouth daily. 90 tablet 1  . Melatonin 1 MG TABS Take 1 mg by mouth at bedtime.    . metFORMIN  (GLUCOPHAGE-XR) 500 MG 24 hr tablet TAKE 2 TABLETS BY MOUTH  DAILY WITH BREAKFAST 180 tablet 0  . Misc Natural Products (OSTEO BI-FLEX ADV JOINT SHIELD) TABS     . Multiple Vitamin (MULTIVITAMIN WITH MINERALS) TABS tablet Take 1 tablet by mouth daily.    Marland Kitchen omeprazole (PRILOSEC) 10 MG capsule TAKE ONE CAPSULE BY MOUTH  TWICE DAILY 30 MIN BEFORE  BKFT AND 30 MIN BEFORE EVE  MEAL FOR 8 WEEKS 180 capsule 0  . PARoxetine (PAXIL) 40 MG tablet TAKE 1 TABLET(40 MG) BY MOUTH EVERY MORNING 90 tablet 1  . rOPINIRole (REQUIP) 2 MG tablet Take 1 tablet at bedtime (Patient taking differently: Take 2 tablets at bedtime) 90 tablet 2   No current facility-administered medications for this visit.     Family History  Problem Relation Age of Onset  . Heart disease Mother   . Hypertension Mother   . Stroke Mother   . Breast cancer Mother   . Diabetes Mellitus II Father   . Cancer Father        CLL  . Hypertension Brother   . Breast cancer Paternal Grandmother   . Breast cancer Maternal Grandmother     Review of Systems  Constitutional: Negative.   HENT: Negative.   Eyes: Negative.   Respiratory: Negative.   Cardiovascular: Negative.   Gastrointestinal: Negative.   Endocrine: Negative.   Genitourinary: Negative.   Musculoskeletal: Negative.   Skin: Negative.   Allergic/Immunologic: Negative.   Neurological: Negative.   Hematological: Negative.   Psychiatric/Behavioral: Negative.     Exam:   BP 110/62 (BP Location: Right Arm, Patient Position: Sitting, Cuff Size: Large)   Pulse 88   Resp 16   Ht 5' 3.5" (1.613 m)   Wt 243 lb (110.2 kg)   LMP 08/14/1994 (Within Months)   BMI 42.37 kg/m     General appearance: alert, cooperative and appears stated age Head: Normocephalic, without obvious abnormality, atraumatic Neck: no adenopathy, supple, symmetrical, trachea midline and thyroid normal to inspection and palpation Lungs: clear to auscultation bilaterally Breasts: normal appearance, no  masses or tenderness, No nipple retraction or dimpling, No nipple discharge or bleeding, No axillary or supraclavicular adenopathy Heart: regular rate and rhythm Abdomen: soft, non-tender; no masses, no organomegaly Extremities: extremities normal, atraumatic, no cyanosis or edema Skin: Skin color, texture, turgor normal. No rashes or lesions Lymph nodes: Cervical, supraclavicular, and axillary nodes normal. No abnormal inguinal nodes palpated Neurologic: Grossly normal  Pelvic: External genitalia:  no  lesions              Urethra:  normal appearing urethra with no masses, tenderness or lesions              Bartholins and Skenes: normal                 Vagina: normal appearing vagina with normal color and discharge, no lesions              Cervix: absent               Pap taken: No. Bimanual Exam:  Uterus:  Absent.               Adnexa: no mass, fullness, tenderness              Rectal exam: Yes.  .  Confirms.              Anus:  normal sphincter tone, no lesions  Chaperone was present for exam. Tyer C - breast cancer assessment tol - 10 year risk of breast cancer 10.15, lifetime risk of 21.8%.   Assessment:   Well woman visit with normal exam. FH breast cancer.  Increased risk of breast cancer.  Mixed incontinence.  PCP managing.  Hx HSV, probably type 1.  DM.   Plan: Mammogram screening yearly.  Will order breast MRI.  Recommended self breast awareness. Pap and HR HPV as above. Guidelines for Calcium, Vitamin D, regular exercise program including cardiovascular and weight bearing exercise. Discussed bladder irritants.  Follow up annually and prn.   After visit summary provided.

## 2018-01-09 NOTE — Patient Instructions (Signed)

## 2018-01-11 DIAGNOSIS — Z9189 Other specified personal risk factors, not elsewhere classified: Secondary | ICD-10-CM | POA: Insufficient documentation

## 2018-01-16 ENCOUNTER — Other Ambulatory Visit: Payer: Self-pay | Admitting: *Deleted

## 2018-01-16 DIAGNOSIS — Z803 Family history of malignant neoplasm of breast: Secondary | ICD-10-CM

## 2018-01-16 DIAGNOSIS — Z9189 Other specified personal risk factors, not elsewhere classified: Secondary | ICD-10-CM

## 2018-01-16 DIAGNOSIS — Z1231 Encounter for screening mammogram for malignant neoplasm of breast: Secondary | ICD-10-CM

## 2018-01-22 ENCOUNTER — Encounter: Payer: Self-pay | Admitting: Obstetrics and Gynecology

## 2018-02-15 ENCOUNTER — Ambulatory Visit
Admission: RE | Admit: 2018-02-15 | Discharge: 2018-02-15 | Disposition: A | Discharge status: Home / Self Care | Payer: 59 | Source: Ambulatory Visit | Attending: Obstetrics and Gynecology | Admitting: Obstetrics and Gynecology

## 2018-02-15 DIAGNOSIS — Z803 Family history of malignant neoplasm of breast: Secondary | ICD-10-CM

## 2018-02-15 DIAGNOSIS — Z1231 Encounter for screening mammogram for malignant neoplasm of breast: Secondary | ICD-10-CM

## 2018-02-15 DIAGNOSIS — Z9189 Other specified personal risk factors, not elsewhere classified: Secondary | ICD-10-CM

## 2018-02-15 MED ORDER — GADOBENATE DIMEGLUMINE 529 MG/ML IV SOLN
20.0000 mL | Freq: Once | INTRAVENOUS | Status: AC | PRN
  Administered 2018-02-15: 20 mL via INTRAVENOUS

## 2018-02-18 ENCOUNTER — Other Ambulatory Visit: Payer: Self-pay | Admitting: *Deleted

## 2018-02-18 ENCOUNTER — Telehealth: Payer: Self-pay | Admitting: Obstetrics and Gynecology

## 2018-02-18 DIAGNOSIS — R928 Other abnormal and inconclusive findings on diagnostic imaging of breast: Secondary | ICD-10-CM

## 2018-02-18 NOTE — Telephone Encounter (Signed)
Phone call to patient regarding breast MRI result.  No answer on call phone.  I left a message to have her return my call.  No details given.   Her breast MRI is showing a suspicious area of the left breast. She needs an MRI guided breast biopsy.

## 2018-02-18 NOTE — Telephone Encounter (Signed)
Left message to call Danajah Birdsell at [phone].  

## 2018-02-18 NOTE — Telephone Encounter (Signed)
MIR result reviewed.  Ok to proceed with MRI guided biopsy of left breast.

## 2018-02-18 NOTE — Telephone Encounter (Signed)
Juliann Pulse at [facility] is asking to talk with Dr.Silva's nurse about this patient. No further details given.

## 2018-02-18 NOTE — Telephone Encounter (Signed)
Spoke with Juliann Pulse at [facility]. Patient had breast MRI on 02/15/18, further evaluation recommended for left breast mass. Patient has not been notified of results. Once provider has reviewed and order placed, GI will contact patient to schedule.   MRI guided bx of left breast recommended, orders pended for review.  Dr. Quincy Simmonds -please review breast MRI dated 02/15/18 and advise, OK to proceed? orders pended.

## 2018-02-19 NOTE — Telephone Encounter (Signed)
Left message to call Sharee Pimple at [phone].  Calling to review options/recommendations for medication for relaxation for biopsy.

## 2018-02-19 NOTE — Telephone Encounter (Signed)
Spoke with Juliann Pulse at [facility], was advised referring MD would be responsible for providing RX for patient. Recommended contacting GI MRI scheduling for further guidance.

## 2018-02-19 NOTE — Telephone Encounter (Signed)
Call to patient regarding her breast MRI results. She has a suspicious area of the left breast and needs a biopsy done.   She states she was very nervous and uncomfortable when she had the MRI done. She feels she will need something to relax to be able to lie still for the biopsy.   Will proceed forward with assistance in scheduling the biopsy.  Will defer to radiology regarding medication for relaxation.

## 2018-02-20 MED ORDER — ALPRAZOLAM 0.5 MG PO TABS: 0.5000 mg | ORAL_TABLET | Freq: Once | ORAL | 0 refills | Status: AC

## 2018-02-20 NOTE — Telephone Encounter (Signed)
Spoke with patient, advised as seen below per Dr. Quincy Simmonds. Patient aware she will need to coordinate singing consent with [facility] prior to taking medication, either dame day as procedure or day before. Advised patient she will also need a driver day of procedure. Rx printed for Xanax, patient will pick up from [facility]. Offered assistance with scheduling breast bx, patient declined, she will contact them directly to schedule. Patient verbalizes understanding.   Printed RX to Dr. Quincy Simmonds for signature.

## 2018-02-20 NOTE — Telephone Encounter (Signed)
Ok for Xanax 0.5 mg x 1 prior to procedure.  Dispense:  2, RF none.

## 2018-02-20 NOTE — Telephone Encounter (Signed)
Patient calling for Sharee Pimple or Gay Filler regarding conversation earlier today.

## 2018-02-20 NOTE — Telephone Encounter (Signed)
Spoke with patient. Patient states she has not received a call from [place] for scheduling, requesting number to call and schedule. Contact information provided. Patient declined assistance with scheduling. Advised to return call if any further assistance is needed.

## 2018-02-20 NOTE — Telephone Encounter (Signed)
Spoke with Prentiss Bells at [place]. Was advised Rx for anxiety would need to come from referring physician. Patient will need a driver. Patient should come in prior to taking medication to sign consent.    Routing to Dr. Quincy Simmonds to advise on RX.

## 2018-02-21 NOTE — Telephone Encounter (Signed)
Per review of Epic, patient scheduled for breast bx on 02/27/18.

## 2018-02-22 ENCOUNTER — Telehealth: Payer: Self-pay | Admitting: Obstetrics and Gynecology

## 2018-02-22 MED ORDER — LORAZEPAM 1 MG PO TABS: ORAL_TABLET | ORAL | 0 refills | Status: DC

## 2018-02-22 NOTE — Telephone Encounter (Signed)
Return call to patient. States GI has prepared her for 2 hour MRI guided biopsy. Due to her weight, increased pressure for images makes it difficult to breath and increases her anxiety.  Xanax 0.25 mg has had minimal affect on her in the past.   Concerned that Xanax 0.5 mg will not be enough for this procedure and this length of time. Requests additional or stronger medication.

## 2018-02-22 NOTE — Telephone Encounter (Signed)
Patient is having a MRI guided breast biopsy on 02/27/18. Patient states she was prescribed Xanax 0.5mg  by Dr. Quincy Simmonds but requests something stronger so that she is not uncomfortable during the procedure.

## 2018-02-22 NOTE — Telephone Encounter (Signed)
Rx completed and ready for patient to pick up.

## 2018-02-22 NOTE — Telephone Encounter (Signed)
OK for Ativan 1 mg ok. Dispense:  2.  RF none. Cannot take it before she signs her consent forms there.

## 2018-02-22 NOTE — Telephone Encounter (Signed)
See telephone encounter dated 02/22/18. Encounter closed.

## 2018-02-22 NOTE — Telephone Encounter (Signed)
Call to patient. Notified of instructions from Dr Quincy Simmonds. Advised to take medication to appointment, arrive early to sign consent before taking medication and will need driver to take her home. Patient is agreeable. Will pick up prescription on Monday.

## 2018-02-25 NOTE — Telephone Encounter (Signed)
Patient picked up prescription for Ativan.  Encounter closed.

## 2018-02-27 ENCOUNTER — Other Ambulatory Visit: Payer: 59

## 2018-02-27 ENCOUNTER — Ambulatory Visit
Admission: RE | Admit: 2018-02-27 | Discharge: 2018-02-27 | Disposition: A | Discharge status: Home / Self Care | Payer: 59 | Source: Ambulatory Visit | Attending: Obstetrics and Gynecology | Admitting: Obstetrics and Gynecology

## 2018-02-27 DIAGNOSIS — R928 Other abnormal and inconclusive findings on diagnostic imaging of breast: Secondary | ICD-10-CM

## 2018-02-27 MED ORDER — GADOBENATE DIMEGLUMINE 529 MG/ML IV SOLN
20.0000 mL | Freq: Once | INTRAVENOUS | Status: AC | PRN
  Administered 2018-02-27: 20 mL via INTRAVENOUS

## 2018-03-25 ENCOUNTER — Encounter: Payer: Self-pay | Admitting: Adult Health

## 2018-03-27 ENCOUNTER — Encounter: Payer: Self-pay | Admitting: Adult Health

## 2018-03-27 ENCOUNTER — Ambulatory Visit: Payer: 59 | Admitting: Adult Health

## 2018-03-27 VITALS — BP 122/64 | HR 67 | Ht 63.5 in | Wt 247.0 lb

## 2018-03-27 DIAGNOSIS — G4733 Obstructive sleep apnea (adult) (pediatric): Secondary | ICD-10-CM

## 2018-03-27 NOTE — Patient Instructions (Signed)
Your Plan:  Continue using CPAP nightly If your symptoms worsen or you develop new symptoms please let us know.   Thank you for coming to see us at [facility]. I hope we have been able to provide you high quality care today.  You may receive a patient satisfaction survey over the next few weeks. We would appreciate your feedback and comments so that we may continue to improve ourselves and the health of our patients.  

## 2018-03-27 NOTE — Progress Notes (Addendum)
PATIENT: Diane Gregory DOB: [birth year]  REASON FOR VISIT: follow up HISTORY FROM: patient  HISTORY OF PRESENT ILLNESS: Today 03/27/18:  A 62 year old female with a history of obstructive sleep apnea on BiPAP.  She returns today for follow-up.  Her CPAP download indicates that she use her machine 28 out of 30 days for compliance of 93%.  She used her machine greater than 4 hours 22 days for compliance of 73%.  On average she uses her machine 5 hours and 55 minutes.  Her residual AHI is 0.8 on 18/14 cn H20 and PS 4 cmH20.  She reports that the Bipap continues to work well for her.  Her Epworth sleepiness score is 5 and fatigue severity score is 20.  She returns today for evaluation.  HISTORY 09/26/2017: I reviewed her BiPAP compliance data from 08/26/2017 through 09/24/2017 which is a total of 30 days, during which time she used her BiPAP every night with percent used days greater than 4 hours at 80%, indicating very good compliance with an average usage of 5 hours and 55 minutes, residual AHI at goal at 2.1 per hour, leak acceptable with the 95th percentile at 11.4 L/m on a BiPAP pressure of 18/14 with pressure support of 4. She reports doing well.In fact, she reports that she has been sleeping better since starting BiPAP therapy compared to AutoPap before. She feels that her nocturia is much improved, she has more daytime energy, much less daytime somnolence. She is quite pleased with her outcome. Restless leg symptoms are under control so long as she takes ropinirole and melatonin an hour before her bedtime. She has in the past couple of months increased her ropinirole from Monday milligram to 1.5 mg. She gets the prescription from her PCP and is advised to talk to her primary care provider about the change in the prescription. Previously, in the past, she took Mirapex for years which eventually stopped working for her. Her A1c has improved at the last check. Her blood pressure values are generally  good, she is aware of the need for weight loss. She has noticed increase in bloating and belching in the mornings as well as gas. She is advised that this may improve with time as she will swallow less and less air during the night.   REVIEW OF SYSTEMS: Out of a complete 14 system review of symptoms, the patient complains only of the following symptoms, and all other reviewed systems are negative.  See HPI  ALLERGIES: Allergies  Allergen Reactions  . Celebrex [Celecoxib] Swelling  . Lisinopril Cough  . Meloxicam Swelling  . Nsaids Swelling  . Penicillins Other (See Comments)    Drop in blood pressure  . Statins Other (See Comments)    Myalgias, edema  . Lidocaine Rash    "bad taste in mouth" with dental work    HOME MEDICATIONS: Outpatient Medications Prior to Visit  Medication Sig Dispense Refill  . Ascorbic Acid (VITAMIN C PO) Take by mouth daily.    . blood glucose meter kit and supplies KIT Dispense based on patient and insurance preference. Use up to four times daily as directed. Dx code: E11.65 1 each 0  . diltiazem (CARDIZEM CD) 240 MG 24 hr capsule Take 1 capsule by mouth daily. 90 capsule 1  . Exenatide ER (BYDUREON BCISE) 2 MG/0.85ML AUIJ Inject 2 mg as directed once a week. 12 pen 2  . fenofibrate 160 MG tablet TAKE 1 TABLET BY MOUTH  DAILY 90 tablet 1  .  fesoterodine (TOVIAZ) 8 MG TB24 tablet Take 1 tablet (8 mg total) by mouth daily. 90 tablet 1  . glipiZIDE (GLUCOTROL XL) 2.5 MG 24 hr tablet TAKE 1 TABLET(2.5 MG) BY MOUTH DAILY WITH BREAKFAST 90 tablet 1  . glipiZIDE (GLUCOTROL XL) 5 MG 24 hr tablet TAKE 1 TABLET BY MOUTH  DAILY WITH BREAKFAST 90 tablet 1  . glucose blood (ONE TOUCH ULTRA TEST) test strip Use to check blood sugars 3 times daily 100 each 12  . Lancets Misc. (ONE TOUCH SURESOFT) MISC Use 1 lancet per test. Test blood sugars 1-4 times per day as instructed. 1 each 1  . losartan-hydrochlorothiazide (HYZAAR) 100-25 MG tablet Take 1 tablet by mouth daily.  90 tablet 1  . Melatonin 1 MG TABS Take 1 mg by mouth at bedtime.    . metFORMIN (GLUCOPHAGE-XR) 500 MG 24 hr tablet TAKE 2 TABLETS BY MOUTH  DAILY WITH BREAKFAST 180 tablet 0  . Misc Natural Products (OSTEO BI-FLEX ADV JOINT SHIELD) TABS     . Multiple Vitamin (MULTIVITAMIN WITH MINERALS) TABS tablet Take 1 tablet by mouth daily.    Marland Kitchen omeprazole (PRILOSEC) 10 MG capsule TAKE ONE CAPSULE BY MOUTH  TWICE DAILY 30 MIN BEFORE  BKFT AND 30 MIN BEFORE EVE  MEAL FOR 8 WEEKS 180 capsule 0  . PARoxetine (PAXIL) 40 MG tablet TAKE 1 TABLET(40 MG) BY MOUTH EVERY MORNING 90 tablet 1  . rOPINIRole (REQUIP) 2 MG tablet Take 1 tablet at bedtime (Patient taking differently: Take 2 tablets at bedtime) 90 tablet 2  . LORazepam (ATIVAN) 1 MG tablet Take 1 tablet by mouth as directed. May repeat once if needed. 2 tablet 0   No facility-administered medications prior to visit.     PAST MEDICAL HISTORY: Past Medical History:  Diagnosis Date  . Anxiety   . Arthritis    "was in my knees before OR; now in my hands" (05/05/2014)  . Bronchitis   . Chronic bronchitis ([place])    "used to get it q yr; not as much since septal repair in 2004"  . Depression   . GERD (gastroesophageal reflux disease)   . High triglycerides   . HSV infection   . Hyperlipidemia   . Hypertension   . Left knee DJD   . Obesity   . Primary localized osteoarthritis of right knee   . Restless leg syndrome   . Sleep apnea    mild; does not wear CPAP  . Type II diabetes mellitus ([place])   . Urgency of urination    and frequency takes Vesicare  . Urinary incontinence   . Walking pneumonia ~ 2013    PAST SURGICAL HISTORY: Past Surgical History:  Procedure Laterality Date  . COLONOSCOPY    . HYSTEROSCOPY  11/94   submuous myomas  . KNEE ARTHROSCOPY Right ~ 2010  . KNEE ARTHROSCOPY W/ MENISCECTOMY Right 2010  . NASAL SEPTUM SURGERY    . REFRACTIVE SURGERY Bilateral ~ 2005  . TONSILLECTOMY  ~ 2004  . TOTAL KNEE ARTHROPLASTY Left  08/11/2013   Procedure: LEFT TOTAL KNEE ARTHROPLASTY;  Surgeon: Lorn Junes, MD;  Location: [facility];  Service: Orthopedics;  Laterality: Left;  . TOTAL KNEE ARTHROPLASTY Right 05/04/2014   Procedure: RIGHT TOTAL KNEE ARTHROPLASTY;  Surgeon: Lorn Junes, MD;  Location: [facility];  Service: Orthopedics;  Laterality: Right;  . UVULOPALATOPHARYNGOPLASTY, TONSILLECTOMY AND SEPTOPLASTY  2004  . VAGINAL HYSTERECTOMY  06/1993    FAMILY HISTORY: Family History  Problem Relation  Age of Onset  . Heart disease Mother   . Hypertension Mother   . Stroke Mother   . Breast cancer Mother   . Diabetes Mellitus II Father   . Cancer Father        CLL  . Hypertension Brother   . Breast cancer Paternal Grandmother   . Breast cancer Maternal Grandmother     SOCIAL HISTORY: Social History   Socioeconomic History  . Marital status: Widowed    Spouse name: Not on file  . Number of children: 2  . Years of education: 80  . Highest education level: Not on file  Occupational History  . Occupation: Intake Coordinator    Comment: administration  Social Needs  . Financial resource strain: Not on file  . Food insecurity:    Worry: Not on file    Inability: Not on file  . Transportation needs:    Medical: No    Non-medical: No  Tobacco Use  . Smoking status: Never Smoker  . Smokeless tobacco: Never Used  Substance and Sexual Activity  . Alcohol use: Yes    Alcohol/week: 1.0 standard drinks    Types: 1 Standard drinks or equivalent per week    Comment: 05/05/2014 "might have a drink once/month"  . Drug use: No  . Sexual activity: Not Currently    Partners: Male    Birth control/protection: Surgical    Comment: TVH 1994  Lifestyle  . Physical activity:    Days per week: Not on file    Minutes per session: Not on file  . Stress: Not on file  Relationships  . Social connections:    Talks on phone: Not on file    Gets together: Not on file    Attends religious service: Not on file    Active  member of club or organization: Not on file    Attends meetings of clubs or organizations: Not on file    Relationship status: Not on file  . Intimate partner violence:    Fear of current or ex partner: Not on file    Emotionally abused: Not on file    Physically abused: Not on file    Forced sexual activity: Not on file  Other Topics Concern  . Not on file  Social History Narrative   Currently single. Fun: Crafts, flea markets, antiquing, travel, reading   Denies religious beliefs that effect health care.    Caffeine: 2-3 drinks a day       PHYSICAL EXAM  Vitals:   03/27/18 0739  BP: 122/64  Pulse: 67  Weight: 247 lb (112 kg)  Height: 5' 3.5" (1.613 m)   Body mass index is 43.07 kg/m.  Generalized: Well developed, in no acute distress   Neurological examination  Mentation: Alert oriented to time, place, history taking. Follows all commands speech and language fluent Cranial nerve II-XII: Pupils were equal round reactive to light. Extraocular movements were full, visual field were full on confrontational test. Facial sensation and strength were normal. Uvula tongue midline. Head turning and shoulder shrug  were normal and symmetric.  Neck 16 inches, Mallampati 3+ Motor: The motor testing reveals 5 over 5 strength of all 4 extremities. Good symmetric motor tone is noted throughout.  Sensory: Sensory testing is intact to soft touch on all 4 extremities. No evidence of extinction is noted.  Coordination: Cerebellar testing reveals good finger-nose-finger and heel-to-shin bilaterally.  Gait and station: Gait is normal.  Reflexes: Deep tendon reflexes are symmetric and  normal bilaterally.   DIAGNOSTIC DATA (LABS, IMAGING, TESTING) - I reviewed patient records, labs, notes, testing and imaging myself where available.  Lab Results  Component Value Date   WBC 9.4 06/22/2017   HGB 13.8 06/22/2017   HCT 41.4 06/22/2017   MCV 87.2 06/22/2017   PLT 217.0 06/22/2017        Component Value Date/Time   NA 137 06/22/2017 0902   K 4.0 06/22/2017 0902   CL 99 06/22/2017 0902   CO2 29 06/22/2017 0902   GLUCOSE 180 (H) 06/22/2017 0902   BUN 18 06/22/2017 0902   CREATININE 0.57 06/22/2017 0902   CALCIUM 9.3 06/22/2017 0902   PROT 7.0 06/22/2017 0902   ALBUMIN 4.2 06/22/2017 0902   AST 30 06/22/2017 0902   ALT 43 (H) 06/22/2017 0902   ALKPHOS 80 06/22/2017 0902   BILITOT 0.4 06/22/2017 0902   GFRNONAA >60 12/04/2015 1417   GFRAA >60 12/04/2015 1417   Lab Results  Component Value Date   CHOL 175 06/22/2017   HDL 39.90 06/22/2017   LDLCALC 101 (H) 06/22/2017   TRIG 170.0 (H) 06/22/2017   CHOLHDL 4 06/22/2017   Lab Results  Component Value Date   HGBA1C 6.5 12/21/2017   No results found for: VITAMINB12 Lab Results  Component Value Date   TSH 1.30 06/22/2017      ASSESSMENT AND PLAN 62 y.o. year old female  has a past medical history of Anxiety, Arthritis, Bronchitis, Chronic bronchitis ([place]), Depression, GERD (gastroesophageal reflux disease), High triglycerides, HSV infection, Hyperlipidemia, Hypertension, Left knee DJD, Obesity, Primary localized osteoarthritis of right knee, Restless leg syndrome, Sleep apnea, Type II diabetes mellitus ([place]), Urgency of urination, Urinary incontinence, and Walking pneumonia (~ 2013). here with:  1.  Obstructive sleep apnea on Bipap  The patient's download shows excellent compliance and good treatment.  She is encouraged to continue using Bipap nightly and greater than 4 hours each night. If her symptoms worsen she develops new symptoms she should let us know.  She will follow-up in 1 year or sooner if needed.   I spent 15 minutes with the patient. 50% of this time was spent reviewing her download  Ward Givens, MSN, NP-C 03/27/2018, 7:48 AM [facility] [address] [place],  [address] [phone]  I reviewed the above note and documentation by the Nurse Practitioner  and agree with the history, physical exam, assessment and plan as outlined above. I was immediately available for face-to-face consultation. Star Age, MD, PhD [facility])

## 2018-04-15 ENCOUNTER — Other Ambulatory Visit: Payer: Self-pay | Admitting: Nurse Practitioner

## 2018-04-16 ENCOUNTER — Other Ambulatory Visit: Payer: Self-pay | Admitting: *Deleted

## 2018-04-16 ENCOUNTER — Other Ambulatory Visit: Payer: Self-pay | Admitting: Nurse Practitioner

## 2018-04-16 DIAGNOSIS — K2100 Gastro-esophageal reflux disease with esophagitis, without bleeding: Secondary | ICD-10-CM

## 2018-04-16 MED ORDER — METFORMIN HCL ER 500 MG PO TB24: 1000.0000 mg | ORAL_TABLET | Freq: Every day | ORAL | 1 refills | Status: DC

## 2018-04-18 ENCOUNTER — Telehealth: Payer: Self-pay | Admitting: Nurse Practitioner

## 2018-04-18 MED ORDER — METFORMIN HCL ER 500 MG PO TB24: 1000.0000 mg | ORAL_TABLET | Freq: Every day | ORAL | 1 refills | Status: DC

## 2018-04-18 NOTE — Telephone Encounter (Signed)
I called pt- she did fill # 60 on 04/16/18 @ [facility] because she was too low to wait on the mail order supply. She does need her scheduled 90 day supply shipped to her from [facility].  New rx phoned into [facility]. See meds.

## 2018-04-18 NOTE — Telephone Encounter (Signed)
Copied from [place] [id number]. Topic: Quick Communication - See Telephone Encounter >> Apr 18, 2018 12:21 PM Neva Seat wrote: [place] [phone]  ref [phone] Needing to know if pt's metFORMIN (GLUCOPHAGE-XR) 500 MG 24 hr tablet  was picked up and by what means because  [place]  didn't get the renewal on the medication. Please call them back to confirm.

## 2018-05-01 ENCOUNTER — Other Ambulatory Visit: Payer: Self-pay | Admitting: Nurse Practitioner

## 2018-05-03 ENCOUNTER — Other Ambulatory Visit: Payer: Self-pay | Admitting: *Deleted

## 2018-05-03 MED ORDER — GLIPIZIDE ER 2.5 MG PO TB24: ORAL_TABLET | ORAL | 1 refills | Status: DC

## 2018-05-03 MED ORDER — GLIPIZIDE ER 5 MG PO TB24: ORAL_TABLET | ORAL | 1 refills | Status: DC

## 2018-05-08 ENCOUNTER — Encounter: Payer: Self-pay | Admitting: Nurse Practitioner

## 2018-05-09 ENCOUNTER — Other Ambulatory Visit: Payer: Self-pay | Admitting: Nurse Practitioner

## 2018-05-21 ENCOUNTER — Ambulatory Visit (INDEPENDENT_AMBULATORY_CARE_PROVIDER_SITE_OTHER): Payer: 59 | Admitting: *Deleted

## 2018-05-21 DIAGNOSIS — Z23 Encounter for immunization: Secondary | ICD-10-CM

## 2018-06-11 ENCOUNTER — Other Ambulatory Visit: Payer: Self-pay | Admitting: Nurse Practitioner

## 2018-06-11 DIAGNOSIS — K2100 Gastro-esophageal reflux disease with esophagitis, without bleeding: Secondary | ICD-10-CM

## 2018-06-16 ENCOUNTER — Other Ambulatory Visit: Payer: Self-pay | Admitting: Nurse Practitioner

## 2018-06-17 ENCOUNTER — Encounter: Payer: Self-pay | Admitting: Family

## 2018-06-17 ENCOUNTER — Ambulatory Visit (INDEPENDENT_AMBULATORY_CARE_PROVIDER_SITE_OTHER): Payer: 59 | Admitting: Family

## 2018-06-17 ENCOUNTER — Ambulatory Visit: Payer: Self-pay | Admitting: *Deleted

## 2018-06-17 VITALS — BP 130/64 | HR 93 | Temp 98.4°F | Ht 63.5 in | Wt 251.0 lb

## 2018-06-17 DIAGNOSIS — K120 Recurrent oral aphthae: Secondary | ICD-10-CM | POA: Diagnosis not present

## 2018-06-17 MED ORDER — MAGIC MOUTHWASH: 5.0000 mL | Freq: Four times a day (QID) | ORAL | 0 refills | Status: AC | PRN

## 2018-06-17 NOTE — Progress Notes (Signed)
Diane Gregory is a 62 y.o. female with the following history as recorded in EpicCare:  Patient Active Problem List   Diagnosis Date Noted  . At high risk for breast cancer 01/11/2018  . Encounter for general adult medical examination with abnormal findings 06/22/2017  . History of anemia 06/22/2017  . Acute upper respiratory infection 04/20/2017  . De Quervain's tenosynovitis, right 03/01/2016  . Overactive bladder 01/24/2016  . Severe obesity (BMI >= 40) ([place]) 07/05/2015  . Anxiety and depression 11/18/2014  . S/P left total knee replacement   . Right knee DJD   . Primary localized osteoarthritis of right knee   . DJD (degenerative joint disease) of knee 08/11/2013  . Diabetes mellitus, type 2 ([place])   . Hypertension   . Restless leg syndrome   . GERD (gastroesophageal reflux disease)   . High triglycerides   . Hyperlipidemia   . Sleep apnea   . Left knee DJD     Current Outpatient Medications  Medication Sig Dispense Refill  . Ascorbic Acid (VITAMIN C PO) Take by mouth daily.    . blood glucose meter kit and supplies KIT Dispense based on patient and insurance preference. Use up to four times daily as directed. Dx code: E11.65 1 each 0  . BYDUREON 2 MG PEN INJECT 2 MG AS DIRECTED ONCE A WEEK 4 each 0  . diltiazem (CARDIZEM CD) 240 MG 24 hr capsule TAKE 1 CAPSULE BY MOUTH  DAILY 90 capsule 1  . Exenatide ER (BYDUREON BCISE) 2 MG/0.85ML AUIJ Inject 2 mg as directed once a week. 12 pen 2  . fenofibrate 160 MG tablet TAKE 1 TABLET BY MOUTH  DAILY 90 tablet 1  . fesoterodine (TOVIAZ) 8 MG TB24 tablet Take 1 tablet (8 mg total) by mouth daily. 90 tablet 1  . glipiZIDE (GLUCOTROL XL) 2.5 MG 24 hr tablet TAKE 1 TABLET(2.5 MG) BY MOUTH DAILY WITH BREAKFAST 30 tablet 1  . glipiZIDE (GLUCOTROL XL) 5 MG 24 hr tablet TAKE 1 TABLET BY MOUTH  DAILY WITH BREAKFAST 90 tablet 1  . glucose blood (ONE TOUCH ULTRA TEST) test strip Use to check blood sugars 3 times daily 100 each 12  . Lancets Misc.  (ONE TOUCH SURESOFT) MISC Use 1 lancet per test. Test blood sugars 1-4 times per day as instructed. 1 each 1  . losartan-hydrochlorothiazide (HYZAAR) 100-25 MG tablet Take 1 tablet by mouth daily. 90 tablet 1  . Melatonin 1 MG TABS Take 1 mg by mouth at bedtime.    . metFORMIN (GLUCOPHAGE-XR) 500 MG 24 hr tablet Take 2 tablets (1,000 mg total) by mouth daily with breakfast. 180 tablet 1  . Misc Natural Products (OSTEO BI-FLEX ADV JOINT SHIELD) TABS     . Multiple Vitamin (MULTIVITAMIN WITH MINERALS) TABS tablet Take 1 tablet by mouth daily.    Marland Kitchen omeprazole (PRILOSEC) 10 MG capsule TAKE 1 CAPSULE BY MOUTH TWO TIMES DAILY 30 MINUTES  BEFORE BREAKFAST AND 30  MINUTES BEFORE EVENING MEAL 180 capsule 0  . PARoxetine (PAXIL) 40 MG tablet TAKE 1 TABLET(40 MG) BY MOUTH EVERY MORNING 90 tablet 1  . rOPINIRole (REQUIP) 2 MG tablet Take 1 tablet at bedtime (Patient taking differently: Take 2 tablets at bedtime) 90 tablet 2  . magic mouthwash SOLN Take 5 mLs by mouth 4 (four) times daily as needed for up to 7 days for mouth pain. 115 mL 0   No current facility-administered medications for this visit.     Allergies: Celebrex [  celecoxib]; Lisinopril; Meloxicam; Nsaids; Penicillins; Statins; and Lidocaine  Past Medical History:  Diagnosis Date  . Anxiety   . Arthritis    "was in my knees before OR; now in my hands" (05/05/2014)  . Bronchitis   . Chronic bronchitis ([place])    "used to get it q yr; not as much since septal repair in 2004"  . Depression   . GERD (gastroesophageal reflux disease)   . High triglycerides   . HSV infection   . Hyperlipidemia   . Hypertension   . Left knee DJD   . Obesity   . Primary localized osteoarthritis of right knee   . Restless leg syndrome   . Sleep apnea    mild; does not wear CPAP  . Type II diabetes mellitus ([place])   . Urgency of urination    and frequency takes Vesicare  . Urinary incontinence   . Walking pneumonia ~ 2013    Past Surgical History:   Procedure Laterality Date  . COLONOSCOPY    . HYSTEROSCOPY  11/94   submuous myomas  . KNEE ARTHROSCOPY Right ~ 2010  . KNEE ARTHROSCOPY W/ MENISCECTOMY Right 2010  . NASAL SEPTUM SURGERY    . REFRACTIVE SURGERY Bilateral ~ 2005  . TONSILLECTOMY  ~ 2004  . TOTAL KNEE ARTHROPLASTY Left 08/11/2013   Procedure: LEFT TOTAL KNEE ARTHROPLASTY;  Surgeon: Lorn Junes, MD;  Location: [facility];  Service: Orthopedics;  Laterality: Left;  . TOTAL KNEE ARTHROPLASTY Right 05/04/2014   Procedure: RIGHT TOTAL KNEE ARTHROPLASTY;  Surgeon: Lorn Junes, MD;  Location: [facility];  Service: Orthopedics;  Laterality: Right;  . UVULOPALATOPHARYNGOPLASTY, TONSILLECTOMY AND SEPTOPLASTY  2004  . VAGINAL HYSTERECTOMY  06/1993    Family History  Problem Relation Age of Onset  . Heart disease Mother   . Hypertension Mother   . Stroke Mother   . Breast cancer Mother   . Diabetes Mellitus II Father   . Cancer Father        CLL  . Hypertension Brother   . Breast cancer Paternal Grandmother   . Breast cancer Maternal Grandmother     Social History   Tobacco Use  . Smoking status: Never Smoker  . Smokeless tobacco: Never Used  Substance Use Topics  . Alcohol use: Yes    Alcohol/week: 1.0 standard drinks    Types: 1 Standard drinks or equivalent per week    Comment: 05/05/2014 "might have a drink once/month"    Subjective:  Patient notes that she woke up suddenly last night around 12 am with sudden onset of left ear pain/ left mouth pain/ "pain on my tongue." Symptoms were preceded by episode of diarrhea- that has since resolved; denies any dental concerns; has noticed localized white sore on outer side of tongue; no difficulty breathing or swallowing;    Objective:  Vitals:   06/17/18 1428  BP: 130/64  Pulse: 93  Temp: 98.4 F (36.9 C)  TempSrc: Oral  SpO2: 96%  Weight: 251 lb 0.6 oz (113.9 kg)  Height: 5' 3.5" (1.613 m)    General: Well developed, well nourished, in no acute distress  Skin :  Warm and dry.  Head: Normocephalic and atraumatic  Eyes: Sclera and conjunctiva clear; pupils round and reactive to light; extraocular movements intact  Ears: External normal; canals clear; tympanic membranes normal  Oropharynx: Pink, supple. Aphthous ulcer noted on tongue Neck: Supple without thyromegaly, adenopathy  Lungs: Respirations unlabored;  Neurologic: Alert and oriented; speech intact; face symmetrical;  moves all extremities well; CNII-XII intact without focal deficit   Assessment:  1. Aphthous ulcer of tongue     Plan:  Reassurance given; will treat with Magic Mouthwash as needed for pain relief; follow-up worse, no better.   No follow-ups on file.  No orders of the defined types were placed in this encounter.   Requested Prescriptions   Signed Prescriptions Disp Refills  . magic mouthwash SOLN 115 mL 0    Sig: Take 5 mLs by mouth 4 (four) times daily as needed for up to 7 days for mouth pain.

## 2018-06-17 NOTE — Patient Instructions (Signed)
Oral Ulcers Oral ulcers are sores inside the mouth or near the mouth. They may be called canker sores or cold sores, which are two types of oral ulcers. Many oral ulcers are harmless and go away on their own. In some cases, oral ulcers may require medical care to determine the cause and proper treatment. What are the causes? Common causes of this condition include:  Viral, bacterial, or fungal infection.  Emotional stress.  Foods or chemicals that irritate the mouth.  Injury or physical irritation of the mouth.  Medicines.  Allergies.  Tobacco use.  Less common causes include:  Skin disease.  A type of herpes virus infection (herpes simplexor herpes zoster).  Oral cancer.  In some cases, the cause of this condition may not be known. What increases the risk? Oral ulcers are more likely to develop in:  People who wear dental braces, dentures, or retainers.  People who do not keep their mouth clean or brush their teeth regularly.  People who have sensitive skin.  People who have conditions that affect the entire body (systemic conditions), such as immune disorders.  What are the signs or symptoms? The main symptom of this condition is one or more oval-shaped or round ulcers that have red borders. Details about symptoms may vary depending on the cause.  Location of the ulcers. They may be inside the mouth, on the gums, or on the insides of the lips or cheeks. They may also be on the lips or on skin that is near the mouth, such as the cheeks and chin.  Pain. Ulcers can be painful and uncomfortable, or they can be painless.  Appearance of the ulcers. They may look like red blisters and be filled with fluid, or they may be white or yellow patches.  Frequency of outbreaks. Ulcers may go away permanently after one outbreak, or they may come back (recur) often or rarely.  How is this diagnosed? This condition is diagnosed with a physical exam. Your health care provider may  ask you questions about your lifestyle and your medical history. You may have tests, including:  Blood tests.  Removal of a small number of cells from an ulcer to be examined under a microscope (biopsy).  How is this treated? This condition is treated by managing any pain and discomfort, and by treating the underlying cause of the ulcers, if necessary. Usually, oral ulcers resolve by themselves in 1-2 weeks. You may be told to keep your mouth clean and avoid things that cause or irritate your ulcers. Your health care provider may prescribe medicines to reduce pain and discomfort or treat the underlying cause, if this applies. Follow these instructions at home: Lifestyle  Follow instructions from your health care provider about eating or drinking restrictions. ? Drink enough fluid to keep your urine clear or pale yellow. ? Avoid foods and drinks that irritate your ulcers.  Avoid tobacco products, including cigarettes, chewing tobacco, or e-cigarettes. If you need help quitting, ask your health care provider.  Avoid excessive alcohol use. Oral Hygiene  Avoid physical or chemical irritants that may have caused the ulcers or made them worse, such as mouthwashes that contain alcohol (ethanol). If you wear dental braces, dentures, or retainers, work with your health care provider to make sure these devices are fitted correctly.  Brush and floss your teeth at least once every day, and get regular dental cleanings and checkups.  Gargle with a salt-water mixture 3-4 times per day or as told by your health  care provider. To make a salt-water mixture, completely dissolve -1 tsp of salt in 1 cup of warm water. General instructions  Take over-the-counter and prescription medicines only as told by your health care provider.  If you have pain, wrap a cold compress in a towel and gently press it against your face to help reduce pain.  Keep all follow-up visits as told by your health care provider.  This is important. Contact a health care provider if:  You have pain that gets worse or does not get better with medicine.  You have 4 or more ulcers at one time.  You have a fever.  You have new ulcers that look or feel different from other ulcers you have.  You have inflammation in one eye or both eyes.  You have ulcers that do not go away after 10 days.  You develop new symptoms in your mouth, such as: ? Bleeding or crusting around your lips or gums. ? Tooth pain. ? Difficulty swallowing.  You develop symptoms on your skin or genitals, such as: ? A rash or blisters. ? Burning or itching sensations.  Your ulcers begin or get worse after you start a new medicine. Get help right away if:  You have difficulty breathing.  You have swelling in your face or neck.  You have excessive bleeding from your mouth.  You have severe pain. This information is not intended to replace advice given to you by your health care provider. Make sure you discuss any questions you have with your health care provider. Document Released: 09/07/2004 Document Revised: 01/03/2016 Document Reviewed: 12/16/2014 Elsevier Interactive Patient Education  [employer].

## 2018-06-17 NOTE — Telephone Encounter (Signed)
Patient is calling to report left ear pain, throat pain and tongue pain. Patient is also reporting cough.Patient states her symptoms started last night. She reports no fever at this time but does have swollen gland at neck on left. Appointment made.  Reason for Disposition . Large lymph node (> 1 inch or 2.5 cm) under the jaw  Answer Assessment - Initial Assessment Questions 1. ONSET: "When did the mouth start hurting?" (e.g., hours or days ago)      Tongue pain- middle of night, throat pain- last night 2. SEVERITY: "How bad is the pain?" (Scale 1-10; mild, moderate or severe)   - MILD (1-3):  doesn't interfere with eating or normal activities   - MODERATE (4-7): interferes with eating some solids and normal activities   - SEVERE (8-10):  excruciating pain, interferes with most normal activities   - SEVERE DYSPHAGIA: can't swallow liquids, drooling     4 3. SORES: "Are there any sores or ulcers in the mouth?" If so, ask: "What part of the mouth are the sores in?"     White patch on tongue 4. FEVER: "Do you have a fever?" If so, ask: "What is your temperature, how was it measured, and when did it start?"     No fever 5. CAUSE: "What do you think is causing the mouth pain?"     unsure 6. OTHER SYMPTOMS: "Do you have any other symptoms?" (e.g., difficulty breathing)     Ear pain- left, swollen gland at neck  Protocols used: MOUTH PAIN-A-AH

## 2018-06-19 ENCOUNTER — Other Ambulatory Visit: Payer: Self-pay | Admitting: Nurse Practitioner

## 2018-06-26 ENCOUNTER — Ambulatory Visit (INDEPENDENT_AMBULATORY_CARE_PROVIDER_SITE_OTHER): Payer: 59 | Admitting: Nurse Practitioner

## 2018-06-26 ENCOUNTER — Encounter: Payer: Self-pay | Admitting: Nurse Practitioner

## 2018-06-26 ENCOUNTER — Other Ambulatory Visit (INDEPENDENT_AMBULATORY_CARE_PROVIDER_SITE_OTHER): Payer: 59

## 2018-06-26 VITALS — BP 120/68 | HR 75 | Ht 63.5 in | Wt 250.0 lb

## 2018-06-26 DIAGNOSIS — K2100 Gastro-esophageal reflux disease with esophagitis, without bleeding: Secondary | ICD-10-CM

## 2018-06-26 DIAGNOSIS — Z0000 Encounter for general adult medical examination without abnormal findings: Secondary | ICD-10-CM

## 2018-06-26 DIAGNOSIS — Z0001 Encounter for general adult medical examination with abnormal findings: Secondary | ICD-10-CM | POA: Diagnosis not present

## 2018-06-26 DIAGNOSIS — E1165 Type 2 diabetes mellitus with hyperglycemia: Secondary | ICD-10-CM

## 2018-06-26 DIAGNOSIS — F419 Anxiety disorder, unspecified: Secondary | ICD-10-CM

## 2018-06-26 DIAGNOSIS — I10 Essential (primary) hypertension: Secondary | ICD-10-CM | POA: Diagnosis not present

## 2018-06-26 DIAGNOSIS — E782 Mixed hyperlipidemia: Secondary | ICD-10-CM | POA: Diagnosis not present

## 2018-06-26 DIAGNOSIS — N3281 Overactive bladder: Secondary | ICD-10-CM | POA: Diagnosis not present

## 2018-06-26 DIAGNOSIS — G2581 Restless legs syndrome: Secondary | ICD-10-CM

## 2018-06-26 DIAGNOSIS — K120 Recurrent oral aphthae: Secondary | ICD-10-CM

## 2018-06-26 DIAGNOSIS — F329 Major depressive disorder, single episode, unspecified: Secondary | ICD-10-CM

## 2018-06-26 DIAGNOSIS — F32A Depression, unspecified: Secondary | ICD-10-CM

## 2018-06-26 LAB — MAGNESIUM: MAGNESIUM: 1.8 mg/dL (ref 1.5–2.5)

## 2018-06-26 LAB — LIPID PANEL
Cholesterol: 164 mg/dL (ref 0–200)
HDL: 38.4 mg/dL — ABNORMAL LOW (ref >39.00)
NonHDL: 125.32
Total CHOL/HDL Ratio: 4
Triglycerides: 232 mg/dL — ABNORMAL HIGH (ref 0.0–149.0)
VLDL: 46.4 mg/dL — ABNORMAL HIGH (ref 0.0–40.0)

## 2018-06-26 LAB — COMPREHENSIVE METABOLIC PANEL
ALK PHOS: 106 U/L (ref 39–117)
ALT: 57 U/L — AB (ref 0–35)
AST: 51 U/L — AB (ref 0–37)
Albumin: 4.4 g/dL (ref 3.5–5.2)
BILIRUBIN TOTAL: 0.4 mg/dL (ref 0.2–1.2)
BUN: 18 mg/dL (ref 6–23)
CO2: 28 mEq/L (ref 19–32)
CREATININE: 0.58 mg/dL (ref 0.40–1.20)
Calcium: 9.4 mg/dL (ref 8.4–10.5)
Chloride: 100 mEq/L (ref 96–112)
GFR: 111.43 mL/min (ref >60.00)
GLUCOSE: 212 mg/dL — AB (ref 70–99)
Potassium: 4 mEq/L (ref 3.5–5.1)
SODIUM: 138 meq/L (ref 135–145)
TOTAL PROTEIN: 7.2 g/dL (ref 6.0–8.3)

## 2018-06-26 LAB — CBC
HCT: 39.4 % (ref 36.0–46.0)
HEMOGLOBIN: 13.7 g/dL (ref 12.0–15.0)
MCHC: 34.8 g/dL (ref 30.0–36.0)
MCV: 84.4 fl (ref 78.0–100.0)
Platelets: 215 10*3/uL (ref 150.0–400.0)
RBC: 4.67 Mil/uL (ref 3.87–5.11)
RDW: 13.6 % (ref 11.5–15.5)
WBC: 7.7 10*3/uL (ref 4.0–10.5)

## 2018-06-26 LAB — LDL CHOLESTEROL, DIRECT: LDL DIRECT: 108 mg/dL

## 2018-06-26 LAB — HEMOGLOBIN A1C: HEMOGLOBIN A1C: 8 % — AB (ref 4.6–6.5)

## 2018-06-26 MED ORDER — GLIPIZIDE ER 5 MG PO TB24: ORAL_TABLET | ORAL | 3 refills | Status: DC

## 2018-06-26 MED ORDER — FENOFIBRATE 160 MG PO TABS: ORAL_TABLET | ORAL | 3 refills | Status: DC

## 2018-06-26 MED ORDER — MAGIC MOUTHWASH: 5.0000 mL | Freq: Four times a day (QID) | ORAL | 0 refills | Status: DC

## 2018-06-26 MED ORDER — EXENATIDE ER 2 MG/0.85ML ~~LOC~~ AUIJ: 2.0000 mg | AUTO-INJECTOR | SUBCUTANEOUS | 2 refills | Status: DC

## 2018-06-26 MED ORDER — PAROXETINE HCL 40 MG PO TABS: ORAL_TABLET | ORAL | 3 refills | Status: DC

## 2018-06-26 MED ORDER — FESOTERODINE FUMARATE ER 8 MG PO TB24: 8.0000 mg | ORAL_TABLET | Freq: Every day | ORAL | 3 refills | Status: DC

## 2018-06-26 MED ORDER — LOSARTAN POTASSIUM-HCTZ 100-25 MG PO TABS: 1.0000 | ORAL_TABLET | Freq: Every day | ORAL | 3 refills | Status: DC

## 2018-06-26 MED ORDER — ROPINIROLE HCL 2 MG PO TABS: ORAL_TABLET | ORAL | 3 refills | Status: DC

## 2018-06-26 MED ORDER — OMEPRAZOLE 20 MG PO CPDR: DELAYED_RELEASE_CAPSULE | ORAL | 1 refills | Status: DC

## 2018-06-26 MED ORDER — METFORMIN HCL ER 500 MG PO TB24: 1000.0000 mg | ORAL_TABLET | Freq: Every day | ORAL | 3 refills | Status: DC

## 2018-06-26 MED ORDER — DILTIAZEM HCL ER COATED BEADS 240 MG PO CP24: 240.0000 mg | ORAL_CAPSULE | Freq: Every day | ORAL | 3 refills | Status: DC

## 2018-06-26 NOTE — Assessment & Plan Note (Signed)
Stable Continue requip F/u for new, worsening symptoms - rOPINIRole (REQUIP) 2 MG tablet; Take 2 tablets at bedtime  Dispense: 180 tablet; Refill: 3

## 2018-06-26 NOTE — Assessment & Plan Note (Signed)
Stable Continue paxil F/u for new, worsening symptoms

## 2018-06-26 NOTE — Assessment & Plan Note (Signed)
Stable Continue current medications Update labs - CBC; Future - Comprehensive metabolic panel; Future - Lipid panel; Future

## 2018-06-26 NOTE — Assessment & Plan Note (Signed)
Increase prilosec dosage F/U if no improvement in GERD symptoms with increased dosage - Magnesium; Future - omeprazole (PRILOSEC) 20 MG capsule; 1 tablet daily 30-60 minutes before breakfast  Dispense: 30 capsule; Refill: 1

## 2018-06-26 NOTE — Progress Notes (Signed)
Diane Gregory is a 62 y.o. female with the following history as recorded in [employer]:  Patient Active Problem List   Diagnosis Date Noted  . At high risk for breast cancer 01/11/2018  . Encounter for general adult medical examination with abnormal findings 06/22/2017  . History of anemia 06/22/2017  . Acute upper respiratory infection 04/20/2017  . De Quervain's tenosynovitis, right 03/01/2016  . Overactive bladder 01/24/2016  . Severe obesity (BMI >= 40) ([place]) 07/05/2015  . Anxiety and depression 11/18/2014  . S/P left total knee replacement   . Right knee DJD   . Primary localized osteoarthritis of right knee   . DJD (degenerative joint disease) of knee 08/11/2013  . Diabetes mellitus, type 2 ([place])   . Hypertension   . Restless leg syndrome   . GERD (gastroesophageal reflux disease)   . High triglycerides   . Hyperlipidemia   . Sleep apnea   . Left knee DJD     Current Outpatient Medications  Medication Sig Dispense Refill  . Ascorbic Acid (VITAMIN C PO) Take by mouth daily.    . blood glucose meter kit and supplies KIT Dispense based on patient and insurance preference. Use up to four times daily as directed. Dx code: E11.65 1 each 0  . diltiazem (CARDIZEM CD) 240 MG 24 hr capsule Take 1 capsule (240 mg total) by mouth daily. 90 capsule 3  . Exenatide ER (BYDUREON BCISE) 2 MG/0.85ML AUIJ Inject 2 mg as directed once a week. 12 pen 2  . fenofibrate 160 MG tablet TAKE 1 TABLET BY MOUTH  DAILY 90 tablet 3  . fesoterodine (TOVIAZ) 8 MG TB24 tablet Take 1 tablet (8 mg total) by mouth daily. 90 tablet 3  . glipiZIDE (GLUCOTROL XL) 5 MG 24 hr tablet TAKE 1 TABLET BY MOUTH  DAILY WITH BREAKFAST 90 tablet 3  . glucose blood (ONE TOUCH ULTRA TEST) test strip Use to check blood sugars 3 times daily 100 each 12  . Lancets Misc. (ONE TOUCH SURESOFT) MISC Use 1 lancet per test. Test blood sugars 1-4 times per day as instructed. 1 each 1  . losartan-hydrochlorothiazide (HYZAAR) 100-25 MG  tablet Take 1 tablet by mouth daily. 90 tablet 3  . Melatonin 1 MG TABS Take 1 mg by mouth at bedtime.    . metFORMIN (GLUCOPHAGE-XR) 500 MG 24 hr tablet Take 2 tablets (1,000 mg total) by mouth daily with breakfast. 180 tablet 3  . Misc Natural Products (OSTEO BI-FLEX ADV JOINT SHIELD) TABS     . Multiple Vitamin (MULTIVITAMIN WITH MINERALS) TABS tablet Take 1 tablet by mouth daily.    Marland Kitchen omeprazole (PRILOSEC) 20 MG capsule 1 tablet daily 30-60 minutes before breakfast 30 capsule 1  . PARoxetine (PAXIL) 40 MG tablet TAKE 1 TABLET(40 MG) BY MOUTH EVERY MORNING 90 tablet 3  . rOPINIRole (REQUIP) 2 MG tablet Take 2 tablets at bedtime 180 tablet 3  . magic mouthwash SOLN Take 5 mLs by mouth 4 (four) times daily. As needed for mouth pain 115 mL 0   No current facility-administered medications for this visit.     Allergies: Celebrex [celecoxib]; Lisinopril; Meloxicam; Nsaids; Penicillins; Statins; and Lidocaine  Past Medical History:  Diagnosis Date  . Anxiety   . Arthritis    "was in my knees before OR; now in my hands" (05/05/2014)  . Bronchitis   . Chronic bronchitis ([place])    "used to get it q yr; not as much since septal repair in 2004"  .  Depression   . GERD (gastroesophageal reflux disease)   . High triglycerides   . HSV infection   . Hyperlipidemia   . Hypertension   . Left knee DJD   . Obesity   . Primary localized osteoarthritis of right knee   . Restless leg syndrome   . Sleep apnea    mild; does not wear CPAP  . Type II diabetes mellitus ([place])   . Urgency of urination    and frequency takes Vesicare  . Urinary incontinence   . Walking pneumonia ~ 2013    Past Surgical History:  Procedure Laterality Date  . COLONOSCOPY    . HYSTEROSCOPY  11/94   submuous myomas  . KNEE ARTHROSCOPY Right ~ 2010  . KNEE ARTHROSCOPY W/ MENISCECTOMY Right 2010  . NASAL SEPTUM SURGERY    . REFRACTIVE SURGERY Bilateral ~ 2005  . TONSILLECTOMY  ~ 2004  . TOTAL KNEE ARTHROPLASTY Left  08/11/2013   Procedure: LEFT TOTAL KNEE ARTHROPLASTY;  Surgeon: Lorn Junes, MD;  Location: [facility];  Service: Orthopedics;  Laterality: Left;  . TOTAL KNEE ARTHROPLASTY Right 05/04/2014   Procedure: RIGHT TOTAL KNEE ARTHROPLASTY;  Surgeon: Lorn Junes, MD;  Location: [facility];  Service: Orthopedics;  Laterality: Right;  . UVULOPALATOPHARYNGOPLASTY, TONSILLECTOMY AND SEPTOPLASTY  2004  . VAGINAL HYSTERECTOMY  06/1993    Family History  Problem Relation Age of Onset  . Heart disease Mother   . Hypertension Mother   . Stroke Mother   . Breast cancer Mother   . Diabetes Mellitus II Father   . Cancer Father        CLL  . Hypertension Brother   . Breast cancer Paternal Grandmother   . Breast cancer Maternal Grandmother     Social History   Tobacco Use  . Smoking status: Never Smoker  . Smokeless tobacco: Never Used  Substance Use Topics  . Alcohol use: Yes    Alcohol/week: 1.0 standard drinks    Types: 1 Standard drinks or equivalent per week    Comment: 05/05/2014 "might have a drink once/month"     Subjective:  Here today for CPE-  Last dental exam: 2019 Last vision exam: 2019 PAP: s/p total hysterectomy, continues regular follow up with GYN Mammogram- up to date Colonoscopy: up to date Lipids: lipid panel ordered DM: A1c ordered Vaccinations: up to date Diet and exercise: no regular diet or exercise, would like to lose some weight but not very motivated  Daily meds:  GERD- prilosec 10 daily, has noticed symptoms of GERD recently-heartburn, despite daily medication compliance   Diabetes- maintained on metformin 1000 BID, glipizide 7.5 daily, bydureon 773m weekly  Lab Results  Component Value Date   HGBA1C 6.5 12/21/2017   Hypertension -maintained on losartan-HCTZ 100-25, diltiazem 240 daily  BP Readings from Last 3 Encounters:  06/26/18 120/68  06/17/18 130/64  03/27/18 122/64   Anxiety and depression- maintained on paxil 481mdaily, no noted adverse effects,  feels her mood is stable on paxil  Overactive bladder- maintained on toviaz 8 daily  HLD- maintained on fenofibrate 160 daily, was on a  Statin in the past but cholesterol was better so discontinued by her prior provider  Lab Results  Component Value Date   CHOL 175 06/22/2017   HDL 39.90 06/22/2017   LDLCALC 101 (H) 06/22/2017   TRIG 170.0 (H) 06/22/2017   CHOLHDL 4 06/22/2017   Restless legs - maintained on requip 73m11maily with good control of her restless legs  Review of Systems  Constitutional: Negative for chills and fever.  HENT: Negative for hearing loss.   Eyes: Negative for blurred vision and double vision.  Respiratory: Negative for cough.   Cardiovascular: Negative for chest pain, palpitations and leg swelling.  Gastrointestinal: Positive for heartburn. Negative for constipation, diarrhea, nausea and vomiting.  Genitourinary: Negative for dysuria, frequency, hematuria and urgency.  Musculoskeletal: Negative for falls.  Skin: Negative for rash.  Neurological: Negative for dizziness, speech change, loss of consciousness and headaches.  Endo/Heme/Allergies: Does not bruise/bleed easily.  Psychiatric/Behavioral: Negative for depression. The patient is not nervous/anxious.    Ulcer - seen by another provider last week for apthous ulcer- left side of tongue- used magic mouthwash as directed and ulcer improved overall but has not completely resolved  Objective:  Vitals:   06/26/18 0759  BP: 120/68  Pulse: 75  SpO2: 96%  Weight: 250 lb (113.4 kg)  Height: 5' 3.5" (1.613 m)    General: Well developed, well nourished, in no acute distress  Skin : Warm and dry.  Head: Normocephalic and atraumatic  Eyes: Sclera and conjunctiva clear; pupils round and reactive to light; extraocular movements intact  Ears: External normal; canals clear; tympanic membranes normal  Oropharynx: Pink, supple. Ulcer to left side of tongue Neck: Supple without thyromegaly  Lungs:  Respirations unlabored; clear to auscultation bilaterally without wheeze, rales, rhonchi  CVS exam: normal rate, regular rhythm, normal S1, S2, no murmurs, rubs, clicks or gallops.  Abdomen: Soft; nontender; nondistended; no masses  Musculoskeletal: No deformities; no active joint inflammation  Extremities: No edema, cyanosis Vessels: Symmetric bilaterally  Neurologic: Alert and oriented; speech intact; face symmetrical; moves all extremities well; CNII-XII intact without focal deficit  Psychiatric: Normal mood and affect.  Assessment:  1. Routine general medical examination at a health care facility   2. Gastroesophageal reflux disease with esophagitis   3. Essential hypertension   4. Type 2 diabetes mellitus with hyperglycemia, without long-term current use of insulin (HCC)   5. Overactive bladder   6. Restless leg syndrome   7. Mixed hyperlipidemia   8. Anxiety and depression   9. Aphthous ulcer of tongue     Plan:   Return in about 6 months (around 12/25/2018) for F/U: Dm-A1c.  Orders Placed This Encounter  Procedures  . CBC    Standing Status:   Future    Standing Expiration Date:   06/27/2019  . Comprehensive metabolic panel    Standing Status:   Future    Standing Expiration Date:   06/27/2019  . Lipid panel    Standing Status:   Future    Standing Expiration Date:   06/27/2019  . Hemoglobin A1c    Standing Status:   Future    Standing Expiration Date:   06/27/2019  . Magnesium    Standing Status:   Future    Standing Expiration Date:   08/26/2018    Requested Prescriptions   Signed Prescriptions Disp Refills  . omeprazole (PRILOSEC) 20 MG capsule 30 capsule 1    Sig: 1 tablet daily 30-60 minutes before breakfast  . diltiazem (CARDIZEM CD) 240 MG 24 hr capsule 90 capsule 3    Sig: Take 1 capsule (240 mg total) by mouth daily.  . fenofibrate 160 MG tablet 90 tablet 3    Sig: TAKE 1 TABLET BY MOUTH  DAILY  . fesoterodine (TOVIAZ) 8 MG TB24 tablet 90 tablet 3     Sig: Take 1 tablet (8 mg total) by mouth  daily.  . glipiZIDE (GLUCOTROL XL) 5 MG 24 hr tablet 90 tablet 3    Sig: TAKE 1 TABLET BY MOUTH  DAILY WITH BREAKFAST  . losartan-hydrochlorothiazide (HYZAAR) 100-25 MG tablet 90 tablet 3    Sig: Take 1 tablet by mouth daily.  . metFORMIN (GLUCOPHAGE-XR) 500 MG 24 hr tablet 180 tablet 3    Sig: Take 2 tablets (1,000 mg total) by mouth daily with breakfast.  . PARoxetine (PAXIL) 40 MG tablet 90 tablet 3    Sig: TAKE 1 TABLET(40 MG) BY MOUTH EVERY MORNING  . rOPINIRole (REQUIP) 2 MG tablet 180 tablet 3    Sig: Take 2 tablets at bedtime  . Exenatide ER (BYDUREON BCISE) 2 MG/0.85ML AUIJ 12 pen 2    Sig: Inject 2 mg as directed once a week.  . magic mouthwash SOLN 115 mL 0    Sig: Take 5 mLs by mouth 4 (four) times daily. As needed for mouth pain    Aphthous ulcer of tongue Repeat magic mouthwash course She was instructed to follow up with dentist if ulcer persists after repeat magic mouthwash course

## 2018-06-26 NOTE — Assessment & Plan Note (Signed)
Continue current medications Update labs - Comprehensive metabolic panel; Future - Lipid panel; Future - Hemoglobin A1c; Future

## 2018-06-26 NOTE — Assessment & Plan Note (Signed)
Continue current medications, should likely be on statin due to diabetes Update labs today-she is fasting F/U with further recommendations pending lab results - CBC; Future - Lipid panel; Future

## 2018-06-26 NOTE — Assessment & Plan Note (Signed)
Stable Continue toviaz Follow up for new, worsening symptoms

## 2018-06-26 NOTE — Patient Instructions (Addendum)
Head downstairs for labs today.  Please repeat magic mouthwash, if no better please follow up with your dentist.  Please increase prilosec as discussed, follow up if symptoms continue after dosage increase   I will plan to see you back in 6 months for regular follow up,, sooner if needed.  Health Maintenance, Female Adopting a healthy lifestyle and getting preventive care can go a long way to promote health and wellness. Talk with your health care provider about what schedule of regular examinations is right for you. This is a good chance for you to check in with your provider about disease prevention and staying healthy. In between checkups, there are plenty of things you can do on your own. Experts have done a lot of research about which lifestyle changes and preventive measures are most likely to keep you healthy. Ask your health care provider for more information. Weight and diet Eat a healthy diet  Be sure to include plenty of vegetables, fruits, low-fat dairy products, and lean protein.  Do not eat a lot of foods high in solid fats, added sugars, or salt.  Get regular exercise. This is one of the most important things you can do for your health. ? Most adults should exercise for at least 150 minutes each week. The exercise should increase your heart rate and make you sweat (moderate-intensity exercise). ? Most adults should also do strengthening exercises at least twice a week. This is in addition to the moderate-intensity exercise.  Maintain a healthy weight  Body mass index (BMI) is a measurement that can be used to identify possible weight problems. It estimates body fat based on height and weight. Your health care provider can help determine your BMI and help you achieve or maintain a healthy weight.  For females 50 years of age and older: ? A BMI below 18.5 is considered underweight. ? A BMI of 18.5 to 24.9 is normal. ? A BMI of 25 to 29.9 is considered overweight. ? A BMI of  30 and above is considered obese.  Watch levels of cholesterol and blood lipids  You should start having your blood tested for lipids and cholesterol at 63 years of age, then have this test every 5 years.  You may need to have your cholesterol levels checked more often if: ? Your lipid or cholesterol levels are high. ? You are older than 63 years of age. ? You are at high risk for heart disease.  Cancer screening Lung Cancer  Lung cancer screening is recommended for adults 65-8 years old who are at high risk for lung cancer because of a history of smoking.  A yearly low-dose CT scan of the lungs is recommended for people who: ? Currently smoke. ? Have quit within the past 15 years. ? Have at least a 30-pack-year history of smoking. A pack year is smoking an average of one pack of cigarettes a day for 1 year.  Yearly screening should continue until it has been 15 years since you quit.  Yearly screening should stop if you develop a health problem that would prevent you from having lung cancer treatment.  Breast Cancer  Practice breast self-awareness. This means understanding how your breasts normally appear and feel.  It also means doing regular breast self-exams. Let your health care provider know about any changes, no matter how small.  If you are in your 20s or 30s, you should have a clinical breast exam (CBE) by a health care provider every 1-3 years as  part of a regular health exam.  If you are 40 or older, have a CBE every year. Also consider having a breast X-ray (mammogram) every year.  If you have a family history of breast cancer, talk to your health care provider about genetic screening.  If you are at high risk for breast cancer, talk to your health care provider about having an MRI and a mammogram every year.  Breast cancer gene (BRCA) assessment is recommended for women who have family members with BRCA-related cancers. BRCA-related cancers  include: ? Breast. ? Ovarian. ? Tubal. ? Peritoneal cancers.  Results of the assessment will determine the need for genetic counseling and BRCA1 and BRCA2 testing.  Cervical Cancer Your health care provider may recommend that you be screened regularly for cancer of the pelvic organs (ovaries, uterus, and vagina). This screening involves a pelvic examination, including checking for microscopic changes to the surface of your cervix (Pap test). You may be encouraged to have this screening done every 3 years, beginning at age 66.  For women ages 23-65, health care providers may recommend pelvic exams and Pap testing every 3 years, or they may recommend the Pap and pelvic exam, combined with testing for human papilloma virus (HPV), every 5 years. Some types of HPV increase your risk of cervical cancer. Testing for HPV may also be done on women of any age with unclear Pap test results.  Other health care providers may not recommend any screening for nonpregnant women who are considered low risk for pelvic cancer and who do not have symptoms. Ask your health care provider if a screening pelvic exam is right for you.  If you have had past treatment for cervical cancer or a condition that could lead to cancer, you need Pap tests and screening for cancer for at least 20 years after your treatment. If Pap tests have been discontinued, your risk factors (such as having a new sexual partner) need to be reassessed to determine if screening should resume. Some women have medical problems that increase the chance of getting cervical cancer. In these cases, your health care provider may recommend more frequent screening and Pap tests.  Colorectal Cancer  This type of cancer can be detected and often prevented.  Routine colorectal cancer screening usually begins at 63 years of age and continues through 63 years of age.  Your health care provider may recommend screening at an earlier age if you have risk factors  for colon cancer.  Your health care provider may also recommend using home test kits to check for hidden blood in the stool.  A small camera at the end of a tube can be used to examine your colon directly (sigmoidoscopy or colonoscopy). This is done to check for the earliest forms of colorectal cancer.  Routine screening usually begins at age 84.  Direct examination of the colon should be repeated every 5-10 years through 63 years of age. However, you may need to be screened more often if early forms of precancerous polyps or small growths are found.  Skin Cancer  Check your skin from head to toe regularly.  Tell your health care provider about any new moles or changes in moles, especially if there is a change in a mole's shape or color.  Also tell your health care provider if you have a mole that is larger than the size of a pencil eraser.  Always use sunscreen. Apply sunscreen liberally and repeatedly throughout the day.  Protect yourself by wearing  long sleeves, pants, a wide-brimmed hat, and sunglasses whenever you are outside.  Heart disease, diabetes, and high blood pressure  High blood pressure causes heart disease and increases the risk of stroke. High blood pressure is more likely to develop in: ? People who have blood pressure in the high end of the normal range (130-139/85-89 mm Hg). ? People who are overweight or obese. ? People who are African American.  If you are 84-53 years of age, have your blood pressure checked every 3-5 years. If you are 36 years of age or older, have your blood pressure checked every year. You should have your blood pressure measured twice-once when you are at a hospital or clinic, and once when you are not at a hospital or clinic. Record the average of the two measurements. To check your blood pressure when you are not at a hospital or clinic, you can use: ? An automated blood pressure machine at a pharmacy. ? A home blood pressure monitor.  If  you are between 81 years and 46 years old, ask your health care provider if you should take aspirin to prevent strokes.  Have regular diabetes screenings. This involves taking a blood sample to check your fasting blood sugar level. ? If you are at a normal weight and have a low risk for diabetes, have this test once every three years after 63 years of age. ? If you are overweight and have a high risk for diabetes, consider being tested at a younger age or more often. Preventing infection Hepatitis B  If you have a higher risk for hepatitis B, you should be screened for this virus. You are considered at high risk for hepatitis B if: ? You were born in a country where hepatitis B is common. Ask your health care provider which countries are considered high risk. ? Your parents were born in a high-risk country, and you have not been immunized against hepatitis B (hepatitis B vaccine). ? You have HIV or AIDS. ? You use needles to inject street drugs. ? You live with someone who has hepatitis B. ? You have had sex with someone who has hepatitis B. ? You get hemodialysis treatment. ? You take certain medicines for conditions, including cancer, organ transplantation, and autoimmune conditions.  Hepatitis C  Blood testing is recommended for: ? Everyone born from [birth year] through 1966. ? Anyone with known risk factors for hepatitis C.  Sexually transmitted infections (STIs)  You should be screened for sexually transmitted infections (STIs) including gonorrhea and chlamydia if: ? You are sexually active and are younger than 63 years of age. ? You are older than 63 years of age and your health care provider tells you that you are at risk for this type of infection. ? Your sexual activity has changed since you were last screened and you are at an increased risk for chlamydia or gonorrhea. Ask your health care provider if you are at risk.  If you do not have HIV, but are at risk, it may be recommended  that you take a prescription medicine daily to prevent HIV infection. This is called pre-exposure prophylaxis (PrEP). You are considered at risk if: ? You are sexually active and do not regularly use condoms or know the HIV status of your partner(s). ? You take drugs by injection. ? You are sexually active with a partner who has HIV.  Talk with your health care provider about whether you are at high risk of being infected with HIV.  If you choose to begin PrEP, you should first be tested for HIV. You should then be tested every 3 months for as long as you are taking PrEP. Pregnancy  If you are premenopausal and you may become pregnant, ask your health care provider about preconception counseling.  If you may become pregnant, take 400 to 800 micrograms (mcg) of folic acid every day.  If you want to prevent pregnancy, talk to your health care provider about birth control (contraception). Osteoporosis and menopause  Osteoporosis is a disease in which the bones lose minerals and strength with aging. This can result in serious bone fractures. Your risk for osteoporosis can be identified using a bone density scan.  If you are 12 years of age or older, or if you are at risk for osteoporosis and fractures, ask your health care provider if you should be screened.  Ask your health care provider whether you should take a calcium or vitamin D supplement to lower your risk for osteoporosis.  Menopause may have certain physical symptoms and risks.  Hormone replacement therapy may reduce some of these symptoms and risks. Talk to your health care provider about whether hormone replacement therapy is right for you. Follow these instructions at home:  Schedule regular health, dental, and eye exams.  Stay current with your immunizations.  Do not use any tobacco products including cigarettes, chewing tobacco, or electronic cigarettes.  If you are pregnant, do not drink alcohol.  If you are  breastfeeding, limit how much and how often you drink alcohol.  Limit alcohol intake to no more than 1 drink per day for nonpregnant women. One drink equals 12 ounces of beer, 5 ounces of wine, or 1 ounces of hard liquor.  Do not use street drugs.  Do not share needles.  Ask your health care provider for help if you need support or information about quitting drugs.  Tell your health care provider if you often feel depressed.  Tell your health care provider if you have ever been abused or do not feel safe at home. This information is not intended to replace advice given to you by your health care provider. Make sure you discuss any questions you have with your health care provider. Document Released: 02/13/2011 Document Revised: 01/06/2016 Document Reviewed: 05/04/2015 Elsevier Interactive Patient Education  [employer].

## 2018-06-26 NOTE — Assessment & Plan Note (Signed)
Reviewed annual screening exams, healthy lifestyle, weight loss, additional information provided on AVS - CBC; Future - Comprehensive metabolic panel; Future - Lipid panel; Future - Hemoglobin A1c; Future - Magnesium; Future

## 2018-06-28 ENCOUNTER — Other Ambulatory Visit: Payer: Self-pay | Admitting: Family

## 2018-07-01 ENCOUNTER — Other Ambulatory Visit: Payer: Self-pay | Admitting: Nurse Practitioner

## 2018-07-01 DIAGNOSIS — E782 Mixed hyperlipidemia: Secondary | ICD-10-CM

## 2018-07-01 DIAGNOSIS — R7989 Other specified abnormal findings of blood chemistry: Secondary | ICD-10-CM

## 2018-07-01 DIAGNOSIS — R945 Abnormal results of liver function studies: Secondary | ICD-10-CM

## 2018-07-01 DIAGNOSIS — E1165 Type 2 diabetes mellitus with hyperglycemia: Secondary | ICD-10-CM

## 2018-07-01 MED ORDER — PRAVASTATIN SODIUM 40 MG PO TABS: 40.0000 mg | ORAL_TABLET | Freq: Every day | ORAL | 1 refills | Status: DC

## 2018-07-02 ENCOUNTER — Encounter: Payer: Self-pay | Admitting: Nurse Practitioner

## 2018-07-05 ENCOUNTER — Ambulatory Visit
Admission: RE | Admit: 2018-07-05 | Discharge: 2018-07-05 | Disposition: A | Discharge status: Home / Self Care | Payer: 59 | Source: Ambulatory Visit | Attending: Nurse Practitioner | Admitting: Nurse Practitioner

## 2018-07-05 DIAGNOSIS — R945 Abnormal results of liver function studies: Principal | ICD-10-CM

## 2018-07-05 DIAGNOSIS — R7989 Other specified abnormal findings of blood chemistry: Secondary | ICD-10-CM

## 2018-07-17 ENCOUNTER — Encounter: Payer: Self-pay | Admitting: Nurse Practitioner

## 2018-07-22 ENCOUNTER — Other Ambulatory Visit: Payer: Self-pay | Admitting: Nurse Practitioner

## 2018-08-01 ENCOUNTER — Encounter: Payer: Self-pay | Admitting: Internal Medicine

## 2018-08-01 NOTE — Progress Notes (Signed)
Name: Diane Gregory  MRN/ DOB: [id number], 1955-09-16   Age/ Sex: 62 y.o., female    PCP: Lance Sell, NP   Reason for Endocrinology Evaluation: Type 2 Diabetes Mellitus     Date of Initial Endocrinology Visit: 08/02/2018     PATIENT IDENTIFIER: Diane Gregory is a 62 y.o. female with a past medical history of HTN, OSA, T2DM and DJD. The patient presented for initial endocrinology clinic visit on 08/02/2018 for consultative assistance with her diabetes management.    HPI: Diane Gregory was    Diagnosed with T2DM ~ 10 years  Prior Medications tried/Intolerance: Just as listed  Currently checking blood sugars through the Dexcom  Hypoglycemia episodes :    No           Hemoglobin A1c has ranged from 6.5%  In 12/2017 , peaking at 8.3% in 2017. Patient required assistance for hypoglycemia: No Patient has required hospitalization within the last 1 year from hyper or hypoglycemia: No   In terms of diet, the patient she avoids sugar-sweetened beverages, she tends to snack between meals.   Works in a [employer]     HOME DIABETES REGIMEN: Bydureon 2 mg weekly Glipizide ER 7.5 mg  With breakfast  Metformin XR 500 mg 2 tab with breakfast   Statin: Yes ACE-I/ARB: Yes Prior Diabetic Education: yes    CONTINUOUS GLUCOSE MONITORING RECORD INTERPRETATION: Unable to download     DIABETIC COMPLICATIONS: Microvascular complications:    Denies: neuropathy, nephropathy, retinopathy   Last eye exam: Completed 10/2017  Macrovascular complications:    Denies: CAD, PVD, CVA   PAST HISTORY: Past Medical History:  Past Medical History:  Diagnosis Date  . Anxiety   . Arthritis    "was in my knees before OR; now in my hands" (05/05/2014)  . Bronchitis   . Chronic bronchitis ([place])    "used to get it q yr; not as much since septal repair in 2004"  . Depression   . GERD (gastroesophageal reflux disease)   . High triglycerides   . HSV infection   . Hyperlipidemia   .  Hypertension   . Left knee DJD   . Obesity   . Primary localized osteoarthritis of right knee   . Restless leg syndrome   . Sleep apnea    mild; does not wear CPAP  . Type II diabetes mellitus ([place])   . Urgency of urination    and frequency takes Vesicare  . Urinary incontinence   . Walking pneumonia ~ 2013   Past Surgical History:  Past Surgical History:  Procedure Laterality Date  . COLONOSCOPY    . HYSTEROSCOPY  11/94   submuous myomas  . KNEE ARTHROSCOPY Right ~ 2010  . KNEE ARTHROSCOPY W/ MENISCECTOMY Right 2010  . NASAL SEPTUM SURGERY    . REFRACTIVE SURGERY Bilateral ~ 2005  . TONSILLECTOMY  ~ 2004  . TOTAL KNEE ARTHROPLASTY Left 08/11/2013   Procedure: LEFT TOTAL KNEE ARTHROPLASTY;  Surgeon: Lorn Junes, MD;  Location: [facility];  Service: Orthopedics;  Laterality: Left;  . TOTAL KNEE ARTHROPLASTY Right 05/04/2014   Procedure: RIGHT TOTAL KNEE ARTHROPLASTY;  Surgeon: Lorn Junes, MD;  Location: [facility];  Service: Orthopedics;  Laterality: Right;  . UVULOPALATOPHARYNGOPLASTY, TONSILLECTOMY AND SEPTOPLASTY  2004  . VAGINAL HYSTERECTOMY  06/1993      Social History:  reports that she has never smoked. She has never used smokeless tobacco. She reports current alcohol use of about 1.0  standard drinks of alcohol per week. She reports that she does not use drugs. Family History:  Family History  Problem Relation Age of Onset  . Heart disease Mother   . Hypertension Mother   . Stroke Mother   . Breast cancer Mother   . Diabetes Mellitus II Father   . Cancer Father        CLL  . Hypertension Brother   . Breast cancer Paternal Grandmother   . Breast cancer Maternal Grandmother      HOME MEDICATIONS: Allergies as of 08/02/2018      Reactions   Celebrex [celecoxib] Swelling   Lisinopril Cough   Meloxicam Swelling   Nsaids Swelling   Penicillins Other (See Comments)   Drop in blood pressure   Statins Other (See Comments)   Myalgias, edema   Lidocaine Rash    "bad taste in mouth" with dental work      Medication List       Accurate as of August 02, 2018  9:00 AM. Always use your most recent med list.        blood glucose meter kit and supplies Kit Dispense based on patient and insurance preference. Use up to four times daily as directed. Dx code: E11.65   diltiazem 240 MG 24 hr capsule Commonly known as:  CARDIZEM CD Take 1 capsule (240 mg total) by mouth daily.   Exenatide ER 2 MG/0.85ML [place] Commonly known as:  BYDUREON BCISE Inject 2 mg as directed once a week.   BYDUREON 2 MG Pen Generic drug:  Exenatide ER INJECT 2 MG AS DIRECTED ONCE A WEEK   fenofibrate 160 MG tablet TAKE 1 TABLET BY MOUTH  DAILY   fesoterodine 8 MG Tb24 tablet Commonly known as:  TOVIAZ Take 1 tablet (8 mg total) by mouth daily.   glipiZIDE 5 MG 24 hr tablet Commonly known as:  GLUCOTROL XL TAKE 1 TABLET BY MOUTH  DAILY WITH BREAKFAST   glucose blood test strip Commonly known as:  ONE TOUCH ULTRA TEST Use to check blood sugars 3 times daily   losartan-hydrochlorothiazide 100-25 MG tablet Commonly known as:  HYZAAR Take 1 tablet by mouth daily.   magic mouthwash Soln Take 5 mLs by mouth 4 (four) times daily. As needed for mouth pain   Melatonin 1 MG Tabs Take 1 mg by mouth at bedtime.   metFORMIN 500 MG 24 hr tablet Commonly known as:  GLUCOPHAGE-XR Take 2 tablets (1,000 mg total) by mouth daily with breakfast.   multivitamin with minerals Tabs tablet Take 1 tablet by mouth daily.   omeprazole 20 MG capsule Commonly known as:  PRILOSEC 1 tablet daily 30-60 minutes before breakfast   ONE TOUCH SURESOFT Misc Use 1 lancet per test. Test blood sugars 1-4 times per day as instructed.   OSTEO BI-FLEX ADV JOINT SHIELD Tabs   PARoxetine 40 MG tablet Commonly known as:  PAXIL TAKE 1 TABLET(40 MG) BY MOUTH EVERY MORNING   pravastatin 40 MG tablet Commonly known as:  PRAVACHOL Take 1 tablet (40 mg total) by mouth daily.   rOPINIRole  2 MG tablet Commonly known as:  REQUIP Take 2 tablets at bedtime   VITAMIN C PO Take by mouth daily.        ALLERGIES: Allergies  Allergen Reactions  . Celebrex [Celecoxib] Swelling  . Lisinopril Cough  . Meloxicam Swelling  . Nsaids Swelling  . Penicillins Other (See Comments)    Drop in blood pressure  . Statins Other (  See Comments)    Myalgias, edema  . Lidocaine Rash    "bad taste in mouth" with dental work     REVIEW OF SYSTEMS: A comprehensive ROS was conducted with the patient and is negative except as per HPI and below:  Review of Systems  Constitutional: Positive for malaise/fatigue. Negative for weight loss.  HENT: Negative for congestion and sore throat.   Eyes: Negative for blurred vision and pain.  Respiratory: Negative for cough and shortness of breath.   Cardiovascular: Negative for chest pain and palpitations.  Gastrointestinal: Negative for diarrhea and nausea.  Genitourinary: Positive for frequency.       Incontinent  Endo/Heme/Allergies: Positive for polydipsia.  Psychiatric/Behavioral: Positive for depression. The patient is nervous/anxious.        Stable       OBJECTIVE:   VITAL SIGNS: BP 140/68 (BP Location: Left Arm, Patient Position: Sitting, Cuff Size: Large)   Pulse 77   Ht 5' 3.5" (1.613 m)   Wt 250 lb (113.4 kg)   LMP 08/14/1994 (Within Months)   SpO2 98%   BMI 43.59 kg/m    PHYSICAL EXAM:  General: Pt appears well and is in NAD  Hydration: Well-hydrated with moist mucous membranes and good skin turgor  HEENT: Head: Unremarkable with good dentition. Oropharynx clear without exudate.  Eyes: External eye exam normal without stare, lid lag or exophthalmos.  EOM intact.  PERRL.  Neck: General: Supple without adenopathy or carotid bruits. Thyroid: Thyroid size normal.  No goiter or nodules appreciated. No thyroid bruit.  Lungs: Clear with good BS bilat with no rales, rhonchi, or wheezes  Heart: RRR with normal S1 and S2 and no  gallops; no murmurs; no rub  Abdomen: Normoactive bowel sounds, soft, nontender, without masses or organomegaly palpable  Extremities:  Lower extremities - No pretibial edema. No lesions.  Skin: Normal texture and temperature to palpation. No rash noted. No Acanthosis nigricans/skin tags. No lipohypertrophy.  Neuro: MS is good with appropriate affect, pt is alert and Ox3    DM foot exam:  The skin of the feet is intact without sores or ulcerations. The pedal pulses are 2+ on right and 2+ on left. The sensation is absent at the great toes to a screening 5.07, 10 gram monofilament bilaterally  DATA REVIEWED:  Lab Results  Component Value Date   HGBA1C 8.0 (H) 06/26/2018   HGBA1C 6.5 12/21/2017   HGBA1C 6.6 (H) 06/22/2017   Lab Results  Component Value Date   MICROALBUR <0.7 09/18/2016   LDLCALC 101 (H) 06/22/2017   CREATININE 0.58 06/26/2018   Lab Results  Component Value Date   MICRALBCREAT 1.4 09/18/2016          ASSESSMENT / PLAN / RECOMMENDATIONS:   1) Type 2 Diabetes Mellitus, Sub-Optima controlled, With neuropathic complications - Most recent A1c of 8.0 %. Goal A1c < 7.0 %.    Plan: GENERAL: I have discussed with the patient the pathophysiology of diabetes. We went over the natural progression of the disease. We talked about both insulin resistance and insulin deficiency. We stressed the importance of lifestyle changes including diet and exercise. I explained the complications associated with diabetes including retinopathy, nephropathy, neuropathy as well as increased risk of cardiovascular disease. We went over the benefit seen with glycemic control.    I explained to the patient that diabetic patients are at higher than normal risk for amputations. The patient was informed that diabetes is the number one cause of non-traumatic amputations in  [place].   Discussed effects of eating and snacking on glucose control  Advised to avoid sugar-sweetened beverages and  snacks  Discussed options for low CHO snacks if necessary   MEDICATIONS:  Glipizide 7.5 mg with Breakfast and 5 mg with Supper  Metformin XR 500 mg Two tabs with breakfast and 2 tabs with supper- titration instruction provided  Continue Bydureon at 2 mg weekly   EDUCATION / INSTRUCTIONS:  BG monitoring instructions: Patient is instructed to check her blood sugars before meals and bedtime through [facility] Endocrinology clinic if: BG persistently < 70 or > 300. . I reviewed the Rule of 15 for the treatment of hypoglycemia in detail with the patient. Literature supplied.   2) Diabetic complications:   Eye: Does not have known diabetic retinopathy.   Neuro/ Feet: Does have known diabetic peripheral neuropathy.based on exam   Renal: Patient does not have known baseline CKD. She is  on an ACEI/ARB at present.   3) Lipids: Patient is  on a statin.    4) Hypertension: She is  at goal of < 140/90 mmHg.    F/u 6 weeks    Signed electronically by: Mack Guise, MD  [facility] Endocrinology  [facility] [address] [place], [place] [address] Phone: [phone] FAX: [phone]   CC: Lance Sell, NP [place] [address] Phone: [phone]  Fax: [phone]    Return to Endocrinology clinic as below: Future Appointments  Date Time Provider [place]  04/02/2019  8:00 AM Ward Givens, NP [facility] None

## 2018-08-02 ENCOUNTER — Ambulatory Visit: Payer: 59 | Admitting: Internal Medicine

## 2018-08-02 ENCOUNTER — Encounter: Payer: Self-pay | Admitting: Internal Medicine

## 2018-08-02 VITALS — BP 140/68 | HR 77 | Ht 63.5 in | Wt 250.0 lb

## 2018-08-02 DIAGNOSIS — E1165 Type 2 diabetes mellitus with hyperglycemia: Secondary | ICD-10-CM | POA: Insufficient documentation

## 2018-08-02 DIAGNOSIS — E1142 Type 2 diabetes mellitus with diabetic polyneuropathy: Secondary | ICD-10-CM

## 2018-08-02 MED ORDER — METFORMIN HCL ER 500 MG PO TB24: 1000.0000 mg | ORAL_TABLET | Freq: Two times a day (BID) | ORAL | 3 refills | Status: DC

## 2018-08-02 MED ORDER — GLIPIZIDE 5 MG PO TABS: 5.0000 mg | ORAL_TABLET | Freq: Two times a day (BID) | ORAL | 3 refills | Status: DC

## 2018-08-02 MED ORDER — GLIPIZIDE ER 2.5 MG PO TB24: 2.5000 mg | ORAL_TABLET | Freq: Every day | ORAL | 6 refills | Status: DC

## 2018-08-02 MED ORDER — GLIPIZIDE ER 2.5 MG PO TB24: 5.0000 mg | ORAL_TABLET | Freq: Two times a day (BID) | ORAL | 6 refills | Status: DC

## 2018-08-02 NOTE — Patient Instructions (Signed)
-   Continue Metformin XR 500 at 2 tablets wit breakfast  - Start Taking Metformin XR 500 mg One  tablet with  Supper for one week, then increase to Two tablets with Supper  - Continue Glipizide at 7.5 mg with breakfast  - Start Glipizide at 5 mg with supper - Continue Bydureon weekly    - Exercise 150 minutes per week (brisk walking ) -Choose healthy, lower carb lower calorie snacks: toss salad, cooked vegetables, cottage cheese, peanut butter, low fat cheese / string cheese, lower sodium deli meat, tuna salad or chicken salad   -HOW TO TREAT LOW BLOOD SUGARS (Blood sugar LESS THAN 70 MG/DL)  Please follow the RULE OF 15 for the treatment of hypoglycemia treatment (when your (blood sugars are less than 70 mg/dL)    STEP 1: Take 15 grams of carbohydrates when your blood sugar is low, which includes:   3-4 GLUCOSE TABS  OR  3-4 OZ OF JUICE OR REGULAR SODA OR  ONE TUBE OF GLUCOSE GEL     STEP 2: RECHECK blood sugar in 15 MINUTES STEP 3: If your blood sugar is still low at the 15 minute recheck --> then, go back to STEP 1 and treat AGAIN with another 15 grams of carbohydrates.

## 2018-09-04 ENCOUNTER — Encounter: Payer: Self-pay | Admitting: Nurse Practitioner

## 2018-09-04 ENCOUNTER — Ambulatory Visit: Payer: 59 | Admitting: Nurse Practitioner

## 2018-09-04 ENCOUNTER — Other Ambulatory Visit: Payer: Self-pay | Admitting: Nurse Practitioner

## 2018-09-04 VITALS — BP 120/70 | HR 82 | Temp 98.1°F | Ht 63.5 in | Wt 246.0 lb

## 2018-09-04 DIAGNOSIS — K529 Noninfective gastroenteritis and colitis, unspecified: Secondary | ICD-10-CM

## 2018-09-04 MED ORDER — METRONIDAZOLE 500 MG PO TABS: 500.0000 mg | ORAL_TABLET | Freq: Three times a day (TID) | ORAL | 0 refills | Status: DC

## 2018-09-04 NOTE — Patient Instructions (Signed)
Take flagyl as directed  Please follow up for fevers over 101, if your symptoms get worse, or if your symptoms dont get better with flagyl course   Diarrhea, Adult Diarrhea is when you pass loose and watery poop (stool) often. Diarrhea can make you feel weak and cause you to lose water in your body (get dehydrated). Losing water in your body can cause you to:  Feel tired and thirsty.  Have a dry mouth.  Go pee (urinate) less often. Diarrhea often lasts 2-3 days. However, it can last longer if it is a sign of something more serious. It is important to treat your diarrhea as told by your doctor. Follow these instructions at home: Eating and drinking     Follow these instructions as told by your doctor:  Take an ORS (oral rehydration solution). This is a drink that helps you replace fluids and minerals your body lost. It is sold at pharmacies and stores.  Drink plenty of fluids, such as: ? Water. ? Ice chips. ? Diluted fruit juice. ? Low-calorie sports drinks. ? Milk, if you want.  Avoid drinking fluids that have a lot of sugar or caffeine in them.  Eat bland, easy-to-digest foods in small amounts as you are able. These foods include: ? Bananas. ? Applesauce. ? Rice. ? Low-fat (lean) meats. ? Toast. ? Crackers.  Avoid alcohol.  Avoid spicy or fatty foods.  Medicines  Take over-the-counter and prescription medicines only as told by your doctor.  If you were prescribed an antibiotic medicine, take it as told by your doctor. Do not stop using the antibiotic even if you start to feel better. General instructions   Wash your hands often using soap and water. If soap and water are not available, use a hand sanitizer. Others in your home should wash their hands as well. Hands should be washed: ? After using the toilet or changing a diaper. ? Before preparing, cooking, or serving food. ? While caring for a sick person. ? While visiting someone in a hospital.  Drink  enough fluid to keep your pee (urine) pale yellow.  Rest at home while you get better.  Watch your condition for any changes.  Take a warm bath to help with any burning or pain from having diarrhea.  Keep all follow-up visits as told by your doctor. This is important. Contact a doctor if:  You have a fever.  Your diarrhea gets worse.  You have new symptoms.  You cannot keep fluids down.  You feel light-headed or dizzy.  You have a headache.  You have muscle cramps. Get help right away if:  You have chest pain.  You feel very weak or you pass out (faint).  You have bloody or black poop or poop that looks like tar.  You have very bad pain, cramping, or bloating in your belly (abdomen).  You have trouble breathing or you are breathing very quickly.  Your heart is beating very quickly.  Your skin feels cold and clammy.  You feel confused.  You have signs of losing too much water in your body, such as: ? Dark pee, very little pee, or no pee. ? Cracked lips. ? Dry mouth. ? Sunken eyes. ? Sleepiness. ? Weakness. Summary  Diarrhea is when you pass loose and watery poop (stool) often.  Diarrhea can make you feel weak and cause you to lose water in your body (get dehydrated).  Take an ORS (oral rehydration solution). This is a drink that is  sold at pharmacies and stores.  Eat bland, easy-to-digest foods in small amounts as you are able.  Contact a doctor if your condition gets worse. Get help right away if you have signs that you have lost too much water in your body. This information is not intended to replace advice given to you by your health care provider. Make sure you discuss any questions you have with your health care provider. Document Released: 01/17/2008 Document Revised: 01/04/2018 Document Reviewed: 01/04/2018 Elsevier Interactive Patient Education  2019 [employer].

## 2018-09-04 NOTE — Progress Notes (Addendum)
Diane Gregory is a 63 y.o. female with the following history as recorded in EpicCare:  Patient Active Problem List   Diagnosis Date Noted  . Type 2 diabetes mellitus with hyperglycemia, without long-term current use of insulin ([place]) 08/02/2018  . At high risk for breast cancer 01/11/2018  . Encounter for general adult medical examination with abnormal findings 06/22/2017  . History of anemia 06/22/2017  . De Quervain's tenosynovitis, right 03/01/2016  . Overactive bladder 01/24/2016  . Severe obesity (BMI >= 40) ([place]) 07/05/2015  . Anxiety and depression 11/18/2014  . S/P left total knee replacement   . Right knee DJD   . Primary localized osteoarthritis of right knee   . DJD (degenerative joint disease) of knee 08/11/2013  . Diabetes mellitus, type 2 ([place])   . Hypertension   . Restless leg syndrome   . GERD (gastroesophageal reflux disease)   . High triglycerides   . Hyperlipidemia   . Sleep apnea   . Left knee DJD     Current Outpatient Medications  Medication Sig Dispense Refill  . Ascorbic Acid (VITAMIN C PO) Take by mouth daily.    Marland Kitchen diltiazem (CARDIZEM CD) 240 MG 24 hr capsule Take 1 capsule (240 mg total) by mouth daily. 90 capsule 3  . Exenatide ER (BYDUREON BCISE) 2 MG/0.85ML AUIJ Inject 2 mg as directed once a week. 12 pen 2  . fenofibrate 160 MG tablet TAKE 1 TABLET BY MOUTH  DAILY 90 tablet 3  . fesoterodine (TOVIAZ) 8 MG TB24 tablet Take 1 tablet (8 mg total) by mouth daily. 90 tablet 3  . glipiZIDE (GLUCOTROL XL) 2.5 MG 24 hr tablet Take 1 tablet (2.5 mg total) by mouth daily with breakfast. 30 tablet 6  . glipiZIDE (GLUCOTROL) 5 MG tablet Take 1 tablet by mouth daily.    Marland Kitchen glucose blood (ONE TOUCH ULTRA TEST) test strip Use to check blood sugars 3 times daily 100 each 12  . Lancets Misc. (ONE TOUCH SURESOFT) MISC Use 1 lancet per test. Test blood sugars 1-4 times per day as instructed. 1 each 1  . losartan-hydrochlorothiazide (HYZAAR) 100-25 MG tablet Take 1 tablet  by mouth daily. 90 tablet 3  . magic mouthwash SOLN Take 5 mLs by mouth 4 (four) times daily. As needed for mouth pain 115 mL 0  . Melatonin 1 MG TABS Take 1 mg by mouth at bedtime.    . metFORMIN (GLUCOPHAGE-XR) 500 MG 24 hr tablet Take 2 tablets (1,000 mg total) by mouth 2 (two) times daily with a meal. (Patient taking differently: Take 500 mg by mouth 2 (two) times daily with a meal. ) 240 tablet 3  . Misc Natural Products (OSTEO BI-FLEX ADV JOINT SHIELD) TABS     . Multiple Vitamin (MULTIVITAMIN WITH MINERALS) TABS tablet Take 1 tablet by mouth daily.    Marland Kitchen omeprazole (PRILOSEC) 20 MG capsule 1 tablet daily 30-60 minutes before breakfast 30 capsule 1  . PARoxetine (PAXIL) 40 MG tablet TAKE 1 TABLET(40 MG) BY MOUTH EVERY MORNING 90 tablet 3  . pravastatin (PRAVACHOL) 40 MG tablet Take 1 tablet (40 mg total) by mouth daily. 30 tablet 1  . rOPINIRole (REQUIP) 2 MG tablet Take 2 tablets at bedtime (Patient taking differently: 2 mg. Take 1 tablets at bedtime) 180 tablet 3  . metroNIDAZOLE (FLAGYL) 500 MG tablet Take 1 tablet (500 mg total) by mouth 3 (three) times daily. 21 tablet 0   No current facility-administered medications for this visit.  Allergies: Celebrex [celecoxib]; Lisinopril; Meloxicam; Nsaids; Penicillins; Statins; and Lidocaine  Past Medical History:  Diagnosis Date  . Anxiety   . Arthritis    "was in my knees before OR; now in my hands" (05/05/2014)  . Bronchitis   . Chronic bronchitis ([place])    "used to get it q yr; not as much since septal repair in 2004"  . Depression   . GERD (gastroesophageal reflux disease)   . High triglycerides   . HSV infection   . Hyperlipidemia   . Hypertension   . Left knee DJD   . Obesity   . Primary localized osteoarthritis of right knee   . Restless leg syndrome   . Sleep apnea    mild; does not wear CPAP  . Type II diabetes mellitus ([place])   . Urgency of urination    and frequency takes Vesicare  . Urinary incontinence   . Walking  pneumonia ~ 2013    Past Surgical History:  Procedure Laterality Date  . COLONOSCOPY    . HYSTEROSCOPY  11/94   submuous myomas  . KNEE ARTHROSCOPY Right ~ 2010  . KNEE ARTHROSCOPY W/ MENISCECTOMY Right 2010  . NASAL SEPTUM SURGERY    . REFRACTIVE SURGERY Bilateral ~ 2005  . TONSILLECTOMY  ~ 2004  . TOTAL KNEE ARTHROPLASTY Left 08/11/2013   Procedure: LEFT TOTAL KNEE ARTHROPLASTY;  Surgeon: Lorn Junes, MD;  Location: [facility];  Service: Orthopedics;  Laterality: Left;  . TOTAL KNEE ARTHROPLASTY Right 05/04/2014   Procedure: RIGHT TOTAL KNEE ARTHROPLASTY;  Surgeon: Lorn Junes, MD;  Location: [place];  Service: Orthopedics;  Laterality: Right;  . UVULOPALATOPHARYNGOPLASTY, TONSILLECTOMY AND SEPTOPLASTY  2004  . VAGINAL HYSTERECTOMY  06/1993    Family History  Problem Relation Age of Onset  . Heart disease Mother   . Hypertension Mother   . Stroke Mother   . Breast cancer Mother   . Diabetes Mellitus II Father   . Cancer Father        CLL  . Hypertension Brother   . Breast cancer Paternal Grandmother   . Breast cancer Maternal Grandmother     Social History   Tobacco Use  . Smoking status: Never Smoker  . Smokeless tobacco: Never Used  Substance Use Topics  . Alcohol use: Yes    Alcohol/week: 1.0 standard drinks    Types: 1 Standard drinks or equivalent per week    Comment: 05/05/2014 "might have a drink once/month"     Subjective:  Diane Gregory is here today requesting evaluation of acute complaint of diarrhea, which first began 1 week ago, seemed to start after eating pink chicken on green raw salad, shortly after this meal she vomited once, then began to have diarrhea, has had about 8-10 watery stools every day since, symptoms seem worse at night. No further vomiting. She did start statin in November 2019, felt she was tolerating well. She also was instructed to increase her metformin dosage on 12/20 by endocrinologist, seemed to be doing okay on increased dosage, but due  to past week of diarrhea, decreased back to prior dosage, however the diarrhea did not get better. No recent travel, no recent antibiotics, no recent sick contacts Tried Imodium which did not help She denies fevers, chills, weakness, syncope, cp, sob, nausea, abdominal pain, urinary changes, vaginal bleeding, rectal bleeding  ROS- See HPI  Objective:  Vitals:   09/04/18 1301  BP: 120/70  Pulse: 82  Temp: 98.1 F (36.7 C)  TempSrc:  Oral  SpO2: 96%  Weight: 246 lb (111.6 kg)  Height: 5' 3.5" (1.613 m)    General: Well developed, well nourished, in no acute distress  Skin : Warm and dry.  Head: Normocephalic and atraumatic  Eyes: Sclera and conjunctiva clear; pupils round and reactive to light; extraocular movements intact  Oropharynx: Pink, supple. No suspicious lesions  Neck: Supple Lungs: Respirations unlabored; clear to auscultation bilaterally without wheeze, rales, rhonchi  CVS exam: S1 and S2 normal.  Abdomen: Soft; nondistended; normoactive bowel sounds; no masses or hepatosplenomegaly; mild generalized tenderness, no rigidity or guarding Extremities: No edema, cyanosis, clubbing  Vessels: Symmetric bilaterally  Neurologic: Alert and oriented; speech intact; face symmetrical; moves all extremities well; CNII-XII intact without focal deficit  Psychiatric: Normal mood and affect.   Assessment:  1. Colitis     Plan:   Suspect viral/food borne illness is more likely than medication reaction Will treat with flagyl course Home management, diarrhea diet, red flags and return precautions including when to seek immediate care discussed and printed on AVS She was instructed to F/U for new, worsening symptoms or if symptoms persist after flagyl course I was going to prescribe cipro as well, but due to age/also being on sulfonylurea, only prescribed flagyl today- will consider additional testing/treatment if symptoms persist  No follow-ups on file.  No orders of the defined  types were placed in this encounter.   Requested Prescriptions   Signed Prescriptions Disp Refills  . metroNIDAZOLE (FLAGYL) 500 MG tablet 21 tablet 0    Sig: Take 1 tablet (500 mg total) by mouth 3 (three) times daily.

## 2018-09-05 ENCOUNTER — Other Ambulatory Visit: Payer: Self-pay | Admitting: *Deleted

## 2018-09-05 DIAGNOSIS — K2100 Gastro-esophageal reflux disease with esophagitis, without bleeding: Secondary | ICD-10-CM

## 2018-09-05 MED ORDER — OMEPRAZOLE 20 MG PO CPDR: DELAYED_RELEASE_CAPSULE | ORAL | 0 refills | Status: DC

## 2018-09-11 ENCOUNTER — Other Ambulatory Visit: Payer: Self-pay | Admitting: Nurse Practitioner

## 2018-09-11 DIAGNOSIS — E1165 Type 2 diabetes mellitus with hyperglycemia: Secondary | ICD-10-CM

## 2018-09-11 DIAGNOSIS — E782 Mixed hyperlipidemia: Secondary | ICD-10-CM

## 2018-09-12 ENCOUNTER — Ambulatory Visit: Payer: 59 | Admitting: Internal Medicine

## 2018-09-20 ENCOUNTER — Encounter: Payer: Self-pay | Admitting: Internal Medicine

## 2018-09-20 ENCOUNTER — Ambulatory Visit: Payer: 59 | Admitting: Internal Medicine

## 2018-09-20 VITALS — BP 138/74 | HR 78 | Ht 64.0 in | Wt 245.6 lb

## 2018-09-20 DIAGNOSIS — E1142 Type 2 diabetes mellitus with diabetic polyneuropathy: Secondary | ICD-10-CM

## 2018-09-20 LAB — POCT GLYCOSYLATED HEMOGLOBIN (HGB A1C): HEMOGLOBIN A1C: 6.7 % — AB (ref 4.0–5.6)

## 2018-09-20 MED ORDER — GLIPIZIDE 5 MG PO TABS: 5.0000 mg | ORAL_TABLET | Freq: Two times a day (BID) | ORAL | 3 refills | Status: DC

## 2018-09-20 NOTE — Patient Instructions (Addendum)
-   Continue Metformin XR 500 at 2 tablets with meals Twice a day  - Take Glipizide 5 mg twice a day with meals - Continue Bydureon weekly     -HOW TO TREAT LOW BLOOD SUGARS (Blood sugar LESS THAN 70 MG/DL)  Please follow the RULE OF 15 for the treatment of hypoglycemia treatment (when your (blood sugars are less than 70 mg/dL)    STEP 1: Take 15 grams of carbohydrates when your blood sugar is low, which includes:   3-4 GLUCOSE TABS  OR  3-4 OZ OF JUICE OR REGULAR SODA OR  ONE TUBE OF GLUCOSE GEL     STEP 2: RECHECK blood sugar in 15 MINUTES STEP 3: If your blood sugar is still low at the 15 minute recheck --> then, go back to STEP 1 and treat AGAIN with another 15 grams of carbohydrates.   

## 2018-09-20 NOTE — Progress Notes (Signed)
Name: Diane Gregory  Age/ Sex: 63 y.o., female   MRN/ DOB: [id number], Feb 15, 1956     PCP: Lance Sell, NP   Reason for Endocrinology Evaluation: Type 2 Diabetes Mellitus  Initial Endocrine Consultative Visit: 08/02/18    PATIENT IDENTIFIER: [name] is a 63 y.o. female with a past medical history of HTN, OSA, T2DM and DJD . The patient has followed with Endocrinology clinic since 08/02/18 for consultative assistance with management of her diabetes.  DIABETIC HISTORY:  Ms. Porrata was diagnosed with T2DM in 2009, she has been on oral glycemic agents since her diagnosis, on her initial presentation to our clinic she was on Bydureon 2 mg weekly, glipizide ER 7-1/2 mg with breakfast, metformin XR 500 mg 2 tablets with breakfast. Her hemoglobin A1c has ranged from 6.5%  In 12/2017 , peaking at 8.3% in 2017  SUBJECTIVE:   During the last visit (12/202019): Her A1c was 8.0%.  We will continue to glipizide 7.5 mg with breakfast and started glipizide 5 mg with supper, with continued Bydureon 2 mg weekly, we have increased the metformin XR 500  mg 2 tablets with breakfast and 2 tablets with supper  Today (09/22/2018): Ms. Sandstrom is here for 6-week follow-up on her diabetes management. She checks her blood sugars before meals and at bedtime daily, through her CGM. The patient has had hypoglycemic episodes since the last clinic visit, which typically occur multiple times a week- most often occuring before lunch.  The patient is symptomatic with these episodes, with symptoms of shaky and clammy. Otherwise, the patient has not required any recent emergency interventions for hypoglycemia and has not had recent hospitalizations secondary to hyper or hypoglycemic episodes.    ROS: As per HPI and as detailed below: Review of Systems  Constitutional: Positive for weight loss. Negative for fever.  HENT: Negative for  congestion and sore throat.   Respiratory: Positive for shortness of breath. Negative for cough.   Cardiovascular: Negative for chest pain and palpitations.  Gastrointestinal: Negative for diarrhea and nausea.      HOME DIABETES REGIMEN:   Glipizide 7.5 mg with Breakfast and 5 mg with Supper  Metformin XR 500 mg Two tabs with breakfast and 2 tabs with supper- titration instruction provided  Continue Bydureon at 2 mg weekly      CONTINUOUS GLUCOSE MONITORING RECORD INTERPRETATION    Dates of Recording: 1/25-09/20/2018  Sensor description: Dexcom   Results statistics:   CGM use % of time 79  Average and SD 173/44  Time in range   59     %  % Time Above 180 40  % Time above 250 5  % Time Below target 1    Glycemic patterns summary: she was noted with hypoglycemia before lunch and overcorrection after lunch   Hyperglycemic episodes  over correction and after supper   Hypoglycemic episodes occurred  Overnight periods: stable       HISTORY:  Past Medical History:  Past Medical History:  Diagnosis Date  . Anxiety   . Arthritis    "was in my knees before OR; now in my hands" (05/05/2014)  . Bronchitis   . Chronic bronchitis ([place])    "used to get it q yr; not as much since septal repair in 2004"  . Depression   . GERD (gastroesophageal reflux disease)   . High triglycerides   . HSV infection   . Hyperlipidemia   . Hypertension   . Left knee DJD   .  Obesity   . Primary localized osteoarthritis of right knee   . Restless leg syndrome   . Sleep apnea    mild; does not wear CPAP  . Type II diabetes mellitus ([place])   . Urgency of urination    and frequency takes Vesicare  . Urinary incontinence   . Walking pneumonia ~ 2013   Past Surgical History:  Past Surgical History:  Procedure Laterality Date  . COLONOSCOPY    . HYSTEROSCOPY  11/94   submuous myomas  . KNEE ARTHROSCOPY Right ~ 2010  . KNEE ARTHROSCOPY W/ MENISCECTOMY Right 2010  . NASAL SEPTUM  SURGERY    . REFRACTIVE SURGERY Bilateral ~ 2005  . TONSILLECTOMY  ~ 2004  . TOTAL KNEE ARTHROPLASTY Left 08/11/2013   Procedure: LEFT TOTAL KNEE ARTHROPLASTY;  Surgeon: Lorn Junes, MD;  Location: [facility];  Service: Orthopedics;  Laterality: Left;  . TOTAL KNEE ARTHROPLASTY Right 05/04/2014   Procedure: RIGHT TOTAL KNEE ARTHROPLASTY;  Surgeon: Lorn Junes, MD;  Location: [facility];  Service: Orthopedics;  Laterality: Right;  . UVULOPALATOPHARYNGOPLASTY, TONSILLECTOMY AND SEPTOPLASTY  2004  . VAGINAL HYSTERECTOMY  06/1993    Social History:  reports that she has never smoked. She has never used smokeless tobacco. She reports current alcohol use of about 1.0 standard drinks of alcohol per week. She reports that she does not use drugs. Family History:  Family History  Problem Relation Age of Onset  . Heart disease Mother   . Hypertension Mother   . Stroke Mother   . Breast cancer Mother   . Diabetes Mellitus II Father   . Cancer Father        CLL  . Hypertension Brother   . Breast cancer Paternal Grandmother   . Breast cancer Maternal Grandmother      HOME MEDICATIONS: Allergies as of 09/20/2018      Reactions   Celebrex [celecoxib] Swelling   Lisinopril Cough   Meloxicam Swelling   Nsaids Swelling   Penicillins Other (See Comments)   Drop in blood pressure   Statins Other (See Comments)   Myalgias, edema   Lidocaine Rash   "bad taste in mouth" with dental work      Medication List       Accurate as of September 20, 2018 11:59 PM. Always use your most recent med list.        diltiazem 240 MG 24 hr capsule Commonly known as:  CARDIZEM CD Take 1 capsule (240 mg total) by mouth daily.   Exenatide ER 2 MG/0.85ML Auij Commonly known as:  BYDUREON BCISE Inject 2 mg as directed once a week.   fenofibrate 160 MG tablet TAKE 1 TABLET BY MOUTH  DAILY   fesoterodine 8 MG Tb24 tablet Commonly known as:  TOVIAZ Take 1 tablet (8 mg total) by mouth daily.   glipiZIDE 5 MG  tablet Commonly known as:  GLUCOTROL Take 1 tablet (5 mg total) by mouth 2 (two) times daily with a meal.   glucose blood test strip Commonly known as:  ONE TOUCH ULTRA TEST Use to check blood sugars 3 times daily   losartan-hydrochlorothiazide 100-25 MG tablet Commonly known as:  HYZAAR Take 1 tablet by mouth daily.   magic mouthwash Soln Take 5 mLs by mouth 4 (four) times daily. As needed for mouth pain   Melatonin 1 MG Tabs Take 1 mg by mouth at bedtime.   metFORMIN 500 MG 24 hr tablet Commonly known as:  GLUCOPHAGE-XR Take 2  tablets (1,000 mg total) by mouth 2 (two) times daily with a meal.   metroNIDAZOLE 500 MG tablet Commonly known as:  FLAGYL Take 1 tablet (500 mg total) by mouth 3 (three) times daily.   multivitamin with minerals Tabs tablet Take 1 tablet by mouth daily.   omeprazole 20 MG capsule Commonly known as:  PRILOSEC 1 tablet daily 30-60 minutes before breakfast   ONE TOUCH SURESOFT Misc Use 1 lancet per test. Test blood sugars 1-4 times per day as instructed.   OSTEO BI-FLEX ADV JOINT SHIELD Tabs   PARoxetine 40 MG tablet Commonly known as:  PAXIL TAKE 1 TABLET(40 MG) BY MOUTH EVERY MORNING   pravastatin 40 MG tablet Commonly known as:  PRAVACHOL TAKE 1 TABLET(40 MG) BY MOUTH DAILY   rOPINIRole 2 MG tablet Commonly known as:  REQUIP Take 2 tablets at bedtime   VITAMIN C PO Take by mouth daily.        OBJECTIVE:   Vital Signs: BP 138/74 (BP Location: Left Arm, Patient Position: Sitting, Cuff Size: Normal)   Pulse 78   Ht 5\' 4"  (1.626 m)   Wt 245 lb 9.6 oz (111.4 kg)   LMP 08/14/1994 (Within Months)   SpO2 97%   BMI 42.16 kg/m   Wt Readings from Last 3 Encounters:  09/20/18 245 lb 9.6 oz (111.4 kg)  09/04/18 246 lb (111.6 kg)  08/02/18 250 lb (113.4 kg)     Exam: General: Pt appears well and is in NAD  Lungs: Clear with good BS bilat with no rales, rhonchi, or wheezes  Heart: RRR with normal S1 and S2 and no gallops; no  murmurs; no rub  Abdomen: Normoactive bowel sounds, soft, nontender, without masses or organomegaly palpable  Extremities: Trace pretibial edema. No tremor.   Skin: Normal texture and temperature to palpation.   Neuro: MS is good with appropriate affect, pt is alert and Ox3    DM foot exam: 08/02/18     The skin of the feet is intact without sores or ulcerations. The pedal pulses are 2+ on right and 2+ on left. The sensation is absent at the great toes to a screening 5.07, 10 gram monofilament bilaterally        DATA REVIEWED:  Lab Results  Component Value Date   HGBA1C 6.7 (A) 09/20/2018   HGBA1C 8.0 (H) 06/26/2018   HGBA1C 6.5 12/21/2017    Lab Results  Component Value Date   CHOL 164 06/26/2018   HDL 38.40 (L) 06/26/2018   LDLCALC 101 (H) 06/22/2017   LDLDIRECT 108.0 06/26/2018   TRIG 232.0 (H) 06/26/2018   CHOLHDL 4 06/26/2018         ASSESSMENT / PLAN / RECOMMENDATIONS:   1)Type 2 Diabetes Mellitus, Sub-Optima controlled, With neuropathic complications - Most recent A1c of  6.7 %. Goal A1c < 7.0 %.     -Praised the patient on lifestyle changes and improve glucose control, her A1c today is 6.7%, but part of her low A1c is the recurrent hypoglycemia right before lunch hence we will adjust her medication regimen as below   MEDICATIONS: - Continue Metformin XR 500 at 2 tablets with meals Twice a day  - Decrease  Glipizide 5 mg  With Breakfast and continue 5 mg with supper  - Continue Bydureon 2 mg weekly   EDUCATION / INSTRUCTIONS:  BG monitoring instructions: Patient is instructed to check her blood sugars 2 times a day, fasting and bedtime   Call [facility] if:  BG persistently < 70 or > 300. . I reviewed the Rule of 15 for the treatment of hypoglycemia in detail with the patient. Literature supplied.    F/U in 3 months    Signed electronically by: Mack Guise, MD  [facility]  [facility] [address] [place], [place] [address] Phone: [phone] FAX: [phone]   CC: Lance Sell, NP [place] [address] Phone: [phone]  Fax: [phone]  Return to Endocrinology clinic as below: Future Appointments  Date Time Provider [place]  12/19/2018  7:50 AM Jakeisha Stricker, Melanie Crazier, MD [id number] None  04/02/2019  8:00 AM Ward Givens, NP [facility] None

## 2018-09-26 ENCOUNTER — Telehealth: Payer: Self-pay | Admitting: Obstetrics and Gynecology

## 2018-09-26 ENCOUNTER — Other Ambulatory Visit: Payer: Self-pay | Admitting: Obstetrics and Gynecology

## 2018-09-26 DIAGNOSIS — Z1231 Encounter for screening mammogram for malignant neoplasm of breast: Secondary | ICD-10-CM

## 2018-09-26 NOTE — Telephone Encounter (Addendum)
Call to [facility]. Spoke with [employer]. Anderson Malta advised Siri Cole will reach out to radiologist, Dr. Jeanmarie Plant, who read the report and biopsy results from 02-27-18 to review and provide recommendations. Anderson Malta advised would return call to office with update. Advised could ask to speak to [facility] or any Triage Nurse available.

## 2018-09-26 NOTE — Telephone Encounter (Signed)
Patient has a questions about MMG versus MRI of breast?

## 2018-09-26 NOTE — Telephone Encounter (Signed)
[facility] contact [facility] to ask if it is reasonable to start with diagnostic mammogram and breast US due to patient cost concern. The radiologist recommended a follow up breast MRI in 6 months, but the report did not say every 6 months.

## 2018-09-26 NOTE — Telephone Encounter (Signed)
The diagnostic mammogram/US would still count toward her deductible.

## 2018-09-26 NOTE — Telephone Encounter (Signed)
Call to patient. Message given to patient as seen below from [facility]. Patient verbalized understanding and appreciative of phone call. Appointment time of 12-23-2018 at 1050 provided for patient. [facility] will [facility] if she needs to reschedule appointment.   Routing to provider and will close encounter.

## 2018-09-26 NOTE — Telephone Encounter (Signed)
Anderson Malta from [facility] returned call and stated Siri Cole had reached out to Dr. Jeanmarie Plant to review MRI from 02-27-18 and had approved for patient to return to screening MMG that would need to be after 12-20-2018. Jennifer scheduled patient for 12-23-2018 at 1050.

## 2018-09-26 NOTE — Telephone Encounter (Signed)
Call to patient. Patient states that [facility] has sent her a letter saying she needs breast MRI's every 6 months. RN advised patient due in 08/2018 for breast MRI as follow up from 02/2018. Patient asking if she needs to have an MRI or if it is okay to have MMG since "they didn't find anything?" Patient states the cost of the MRI is on her, as her insurance doesn't cover it and it will cost her between $1200 and $1500. RN advised follow up based on results, but would review with Dr. Quincy Simmonds and return call. Patient agreeable.     Routing to provider for review.

## 2018-09-29 ENCOUNTER — Encounter: Payer: Self-pay | Admitting: Nurse Practitioner

## 2018-09-30 ENCOUNTER — Other Ambulatory Visit: Payer: Self-pay

## 2018-09-30 ENCOUNTER — Encounter: Payer: Self-pay | Admitting: Internal Medicine

## 2018-09-30 MED ORDER — METFORMIN HCL ER 500 MG PO TB24: ORAL_TABLET | ORAL | 0 refills | Status: DC

## 2018-09-30 MED ORDER — METFORMIN HCL ER 500 MG PO TB24: ORAL_TABLET | ORAL | 1 refills | Status: DC

## 2018-10-09 ENCOUNTER — Encounter: Payer: Self-pay | Admitting: Nurse Practitioner

## 2018-10-09 ENCOUNTER — Ambulatory Visit: Payer: 59 | Admitting: Nurse Practitioner

## 2018-10-09 ENCOUNTER — Other Ambulatory Visit: Payer: 59

## 2018-10-09 VITALS — BP 118/70 | HR 82 | Ht 64.0 in | Wt 242.0 lb

## 2018-10-09 DIAGNOSIS — R197 Diarrhea, unspecified: Secondary | ICD-10-CM | POA: Diagnosis not present

## 2018-10-09 NOTE — Patient Instructions (Addendum)
I have ordered stool testing, stop by lab downstairs  I have placed referral to GI for further evaluation    Diarrhea, Adult Diarrhea is when you pass loose and watery poop (stool) often. Diarrhea can make you feel weak and cause you to lose water in your body (get dehydrated). Losing water in your body can cause you to:  Feel tired and thirsty.  Have a dry mouth.  Go pee (urinate) less often. Diarrhea often lasts 2-3 days. However, it can last longer if it is a sign of something more serious. It is important to treat your diarrhea as told by your doctor. Follow these instructions at home: Eating and drinking     Follow these instructions as told by your doctor:  Take an ORS (oral rehydration solution). This is a drink that helps you replace fluids and minerals your body lost. It is sold at pharmacies and stores.  Drink plenty of fluids, such as: ? Water. ? Ice chips. ? Diluted fruit juice. ? Low-calorie sports drinks. ? Milk, if you want.  Avoid drinking fluids that have a lot of sugar or caffeine in them.  Eat bland, easy-to-digest foods in small amounts as you are able. These foods include: ? Bananas. ? Applesauce. ? Rice. ? Low-fat (lean) meats. ? Toast. ? Crackers.  Avoid alcohol.  Avoid spicy or fatty foods.  Medicines  Take over-the-counter and prescription medicines only as told by your doctor.  If you were prescribed an antibiotic medicine, take it as told by your doctor. Do not stop using the antibiotic even if you start to feel better. General instructions   Wash your hands often using soap and water. If soap and water are not available, use a hand sanitizer. Others in your home should wash their hands as well. Hands should be washed: ? After using the toilet or changing a diaper. ? Before preparing, cooking, or serving food. ? While caring for a sick person. ? While visiting someone in a hospital.  Drink enough fluid to keep your pee (urine) pale  yellow.  Rest at home while you get better.  Watch your condition for any changes.  Take a warm bath to help with any burning or pain from having diarrhea.  Keep all follow-up visits as told by your doctor. This is important. Contact a doctor if:  You have a fever.  Your diarrhea gets worse.  You have new symptoms.  You cannot keep fluids down.  You feel light-headed or dizzy.  You have a headache.  You have muscle cramps. Get help right away if:  You have chest pain.  You feel very weak or you pass out (faint).  You have bloody or black poop or poop that looks like tar.  You have very bad pain, cramping, or bloating in your belly (abdomen).  You have trouble breathing or you are breathing very quickly.  Your heart is beating very quickly.  Your skin feels cold and clammy.  You feel confused.  You have signs of losing too much water in your body, such as: ? Dark pee, very little pee, or no pee. ? Cracked lips. ? Dry mouth. ? Sunken eyes. ? Sleepiness. ? Weakness. Summary  Diarrhea is when you pass loose and watery poop (stool) often.  Diarrhea can make you feel weak and cause you to lose water in your body (get dehydrated).  Take an ORS (oral rehydration solution). This is a drink that is sold at pharmacies and stores.  Eat  bland, easy-to-digest foods in small amounts as you are able.  Contact a doctor if your condition gets worse. Get help right away if you have signs that you have lost too much water in your body. This information is not intended to replace advice given to you by your health care provider. Make sure you discuss any questions you have with your health care provider. Document Released: 01/17/2008 Document Revised: 01/04/2018 Document Reviewed: 01/04/2018 Elsevier Interactive Patient Education  2019 [employer].

## 2018-10-09 NOTE — Progress Notes (Signed)
Diane Gregory is a 63 y.o. female with the following history as recorded in EpicCare:  Patient Active Problem List   Diagnosis Date Noted  . Type 2 diabetes mellitus with hyperglycemia, without long-term current use of insulin ([place]) 08/02/2018  . At high risk for breast cancer 01/11/2018  . Encounter for general adult medical examination with abnormal findings 06/22/2017  . History of anemia 06/22/2017  . De Quervain's tenosynovitis, right 03/01/2016  . Overactive bladder 01/24/2016  . Severe obesity (BMI >= 40) ([place]) 07/05/2015  . Anxiety and depression 11/18/2014  . S/P left total knee replacement   . Right knee DJD   . Primary localized osteoarthritis of right knee   . DJD (degenerative joint disease) of knee 08/11/2013  . Diabetes mellitus, type 2 ([place])   . Hypertension   . Restless leg syndrome   . GERD (gastroesophageal reflux disease)   . High triglycerides   . Hyperlipidemia   . Sleep apnea   . Left knee DJD     Current Outpatient Medications  Medication Sig Dispense Refill  . Ascorbic Acid (VITAMIN C PO) Take by mouth daily.    Marland Kitchen diltiazem (CARDIZEM CD) 240 MG 24 hr capsule Take 1 capsule (240 mg total) by mouth daily. 90 capsule 3  . Exenatide ER (BYDUREON BCISE) 2 MG/0.85ML AUIJ Inject 2 mg as directed once a week. 12 pen 2  . fenofibrate 160 MG tablet TAKE 1 TABLET BY MOUTH  DAILY 90 tablet 3  . fesoterodine (TOVIAZ) 8 MG TB24 tablet Take 1 tablet (8 mg total) by mouth daily. 90 tablet 3  . glipiZIDE (GLUCOTROL) 5 MG tablet Take 1 tablet (5 mg total) by mouth 2 (two) times daily with a meal. 180 tablet 3  . glucose blood (ONE TOUCH ULTRA TEST) test strip Use to check blood sugars 3 times daily 100 each 12  . Lancets Misc. (ONE TOUCH SURESOFT) MISC Use 1 lancet per test. Test blood sugars 1-4 times per day as instructed. 1 each 1  . losartan-hydrochlorothiazide (HYZAAR) 100-25 MG tablet Take 1 tablet by mouth daily. 90 tablet 3  . Melatonin 1 MG TABS Take 1 mg by mouth  at bedtime.    . metFORMIN (GLUCOPHAGE-XR) 500 MG 24 hr tablet Take 4 tablets by mouth once daily. 360 tablet 1  . Misc Natural Products (OSTEO BI-FLEX ADV JOINT SHIELD) TABS     . Multiple Vitamin (MULTIVITAMIN WITH MINERALS) TABS tablet Take 1 tablet by mouth daily.    Marland Kitchen omeprazole (PRILOSEC) 20 MG capsule 1 tablet daily 30-60 minutes before breakfast 90 capsule 0  . PARoxetine (PAXIL) 40 MG tablet TAKE 1 TABLET(40 MG) BY MOUTH EVERY MORNING 90 tablet 3  . pravastatin (PRAVACHOL) 40 MG tablet TAKE 1 TABLET(40 MG) BY MOUTH DAILY 30 tablet 1  . rOPINIRole (REQUIP) 2 MG tablet Take 2 tablets at bedtime (Patient taking differently: 2 mg. Take 1 tablets at bedtime) 180 tablet 3   No current facility-administered medications for this visit.     Allergies: Celebrex [celecoxib]; Lisinopril; Meloxicam; Nsaids; Penicillins; Statins; and Lidocaine  Past Medical History:  Diagnosis Date  . Anxiety   . Arthritis    "was in my knees before OR; now in my hands" (05/05/2014)  . Bronchitis   . Chronic bronchitis ([place])    "used to get it q yr; not as much since septal repair in 2004"  . Depression   . GERD (gastroesophageal reflux disease)   . High triglycerides   .  HSV infection   . Hyperlipidemia   . Hypertension   . Left knee DJD   . Obesity   . Primary localized osteoarthritis of right knee   . Restless leg syndrome   . Sleep apnea    mild; does not wear CPAP  . Type II diabetes mellitus ([place])   . Urgency of urination    and frequency takes Vesicare  . Urinary incontinence   . Walking pneumonia ~ 2013    Past Surgical History:  Procedure Laterality Date  . COLONOSCOPY    . HYSTEROSCOPY  11/94   submuous myomas  . KNEE ARTHROSCOPY Right ~ 2010  . KNEE ARTHROSCOPY W/ MENISCECTOMY Right 2010  . NASAL SEPTUM SURGERY    . REFRACTIVE SURGERY Bilateral ~ 2005  . TONSILLECTOMY  ~ 2004  . TOTAL KNEE ARTHROPLASTY Left 08/11/2013   Procedure: LEFT TOTAL KNEE ARTHROPLASTY;  Surgeon: Lorn Junes, MD;  Location: [facility];  Service: Orthopedics;  Laterality: Left;  . TOTAL KNEE ARTHROPLASTY Right 05/04/2014   Procedure: RIGHT TOTAL KNEE ARTHROPLASTY;  Surgeon: Lorn Junes, MD;  Location: [facility];  Service: Orthopedics;  Laterality: Right;  . UVULOPALATOPHARYNGOPLASTY, TONSILLECTOMY AND SEPTOPLASTY  2004  . VAGINAL HYSTERECTOMY  06/1993    Family History  Problem Relation Age of Onset  . Heart disease Mother   . Hypertension Mother   . Stroke Mother   . Breast cancer Mother   . Diabetes Mellitus II Father   . Cancer Father        CLL  . Hypertension Brother   . Breast cancer Paternal Grandmother   . Breast cancer Maternal Grandmother     Social History   Tobacco Use  . Smoking status: Never Smoker  . Smokeless tobacco: Never Used  Substance Use Topics  . Alcohol use: Yes    Alcohol/week: 1.0 standard drinks    Types: 1 Standard drinks or equivalent per week    Comment: 05/05/2014 "might have a drink once/month"     Subjective:  [name] is here today for acute visit, CC: Diarrhea She reports episodes of diarrhea beginning after eating chili beans on this past Thursday, began to have loose stools on Friday, was up all night Saturday with diarrhea, then has started to improve this week but continues to have about 1 watery "yellow" stool per day, some intermittent "Cramping" and increased gas. Of note, she was treated with flagyl course for possible colitis on 09/04/18, she says that all of her GI symptoms did resolve after the flagyl course and she was feeling well again until this past Thursday when she ate the chili beans She denies fevers, chills, weakness, syncope, nausea, vomiting, urinary changes, rectal bleeding.   ROS- See HPI  Objective:  Vitals:   10/09/18 1303  BP: 118/70  Pulse: 82  SpO2: 94%  Weight: 242 lb (109.8 kg)  Height: 5\' 4"  (1.626 m)    General: Well developed, well nourished, in no acute distress  Skin : Warm and dry.  Head:  Normocephalic and atraumatic  Eyes: Sclera and conjunctiva clear; pupils round and reactive to light; extraocular movements intact  Oropharynx: Pink, supple. No suspicious lesions  Neck: Supple Lungs: Respirations unlabored; clear to auscultation bilaterally without wheeze, rales, rhonchi  CVS exam: Normal rate and regular rhythm, S1 and S2 normal.  Abdomen: Soft; nontender; rotund; normoactive bowel sounds; no masses or hepatosplenomegaly  Extremities: No edema, cyanosis, clubbing  Vessels: Symmetric bilaterally  Neurologic: Alert and oriented; speech intact;  face symmetrical; moves all extremities well; CNII-XII intact without focal deficit  Psychiatric: Normal mood and affect.   Assessment:  1. Diarrhea, unspecified type     Plan:   Due to recurrent/persistent diarrhea, will check stool testing today and place referral to GI for further evaluation- she is agreeable to this plan Home management, diarrhea diet, red flags and return precautions including when to seek immediate care discussed and printed on AVS  No follow-ups on file.  Orders Placed This Encounter  Procedures  . Gastrointestinal Pathogen Panel PCR    Standing Status:   Future    Standing Expiration Date:   10/09/2019  . Ambulatory referral to Gastroenterology    Referral Priority:   Routine    Referral Type:   Consultation    Referral Reason:   Specialty Services Required    Number of Visits Requested:   1    Requested Prescriptions    No prescriptions requested or ordered in this encounter

## 2018-10-10 ENCOUNTER — Other Ambulatory Visit: Payer: 59

## 2018-10-10 DIAGNOSIS — R197 Diarrhea, unspecified: Secondary | ICD-10-CM

## 2018-10-11 LAB — GASTROINTESTINAL PATHOGEN PANEL PCR
C. difficile Tox A/B, PCR: NOT DETECTED
Campylobacter, PCR: NOT DETECTED
Cryptosporidium, PCR: NOT DETECTED
E COLI 0157, PCR: NOT DETECTED
E coli (ETEC) LT/ST PCR: NOT DETECTED
E coli (STEC) stx1/stx2, PCR: NOT DETECTED
Giardia lamblia, PCR: NOT DETECTED
Norovirus, PCR: NOT DETECTED
Rotavirus A, PCR: NOT DETECTED
Salmonella, PCR: NOT DETECTED
Shigella, PCR: NOT DETECTED

## 2018-10-15 ENCOUNTER — Other Ambulatory Visit: Payer: Self-pay | Admitting: Nurse Practitioner

## 2018-10-24 ENCOUNTER — Other Ambulatory Visit: Payer: Self-pay | Admitting: Nurse Practitioner

## 2018-10-24 DIAGNOSIS — E1165 Type 2 diabetes mellitus with hyperglycemia: Secondary | ICD-10-CM

## 2018-10-24 DIAGNOSIS — E782 Mixed hyperlipidemia: Secondary | ICD-10-CM

## 2018-10-30 ENCOUNTER — Encounter: Payer: Self-pay | Admitting: Nurse Practitioner

## 2018-10-30 ENCOUNTER — Other Ambulatory Visit: Payer: Self-pay | Admitting: Nurse Practitioner

## 2018-10-30 DIAGNOSIS — K2100 Gastro-esophageal reflux disease with esophagitis, without bleeding: Secondary | ICD-10-CM

## 2018-10-31 ENCOUNTER — Other Ambulatory Visit: Payer: Self-pay | Admitting: *Deleted

## 2018-10-31 DIAGNOSIS — E782 Mixed hyperlipidemia: Secondary | ICD-10-CM

## 2018-10-31 DIAGNOSIS — E1165 Type 2 diabetes mellitus with hyperglycemia: Secondary | ICD-10-CM

## 2018-10-31 MED ORDER — PRAVASTATIN SODIUM 40 MG PO TABS: 40.0000 mg | ORAL_TABLET | Freq: Every day | ORAL | 2 refills | Status: DC

## 2018-11-25 ENCOUNTER — Encounter: Payer: Self-pay | Admitting: Nurse Practitioner

## 2018-12-19 ENCOUNTER — Encounter: Payer: Self-pay | Admitting: Internal Medicine

## 2018-12-19 ENCOUNTER — Ambulatory Visit (INDEPENDENT_AMBULATORY_CARE_PROVIDER_SITE_OTHER): Payer: 59 | Admitting: Internal Medicine

## 2018-12-19 DIAGNOSIS — E1165 Type 2 diabetes mellitus with hyperglycemia: Secondary | ICD-10-CM | POA: Diagnosis not present

## 2018-12-19 DIAGNOSIS — E1142 Type 2 diabetes mellitus with diabetic polyneuropathy: Secondary | ICD-10-CM | POA: Diagnosis not present

## 2018-12-19 NOTE — Progress Notes (Signed)
Virtual Visit via Video Note  I connected with Friend o 12/19/18 at 7:50 AM  by a video enabled telemedicine application and verified that I am speaking with the correct person using two identifiers.   I discussed the limitations of evaluation and management by telemedicine and the availability of in person appointments. The patient expressed understanding and agreed to proceed.  -Location of the patient : Home -Location of the provider : office -The names of all persons participating in the telemedicine service : Pt and myself         Name: Diane Gregory  Age/ Sex: 63 y.o., female   MRN/ DOB: [id number], 1956-02-16     PCP: Lance Sell, NP   Reason for Endocrinology Evaluation: Type 2 Diabetes Mellitus  Initial Endocrine Consultative Visit: 08/02/18    PATIENT IDENTIFIER: Diane Gregory is a 63 y.o. female with a past medical history of HTN, OSA, T2DM and DJD . The patient has followed with Endocrinology clinic since 08/02/18 for consultative assistance with management of her diabetes.  DIABETIC HISTORY:  Diane Gregory was diagnosed with T2DM in 2009, she has been on oral glycemic agents since her diagnosis, on her initial presentation to our clinic she was on Bydureon 2 mg weekly, glipizide ER 7-1/2 mg with breakfast, metformin XR 500 mg 2 tablets with breakfast. Her hemoglobin A1c has ranged from 6.5%  In 12/2017 , peaking at 8.3% in 2017  SUBJECTIVE:   During the last visit (09/20/2018): Her A1c was 6.7% .  We decreased Glipizide to 5 mg BID, with continued Bydureon 2 mg weekly, and  metformin XR 500 .    Today (12/19/2018): Diane Gregory is here for virtual 3 month follow-up on her diabetes management. She checks her blood sugars before meals and at bedtime daily, through her CGM. The patient has not had hypoglycemic episodes since the last clinic visit.She has been let go from work, but she continues to have benefits. She tends to snack at night while watching TV. Otherwise,  the patient has not required any recent emergency interventions for hypoglycemia and has not had recent hospitalizations secondary to hyper or hypoglycemic episodes.    ROS: As per HPI and as detailed below: Review of Systems  Constitutional: Negative for fever and weight loss.  HENT: Negative for congestion and sore throat.   Respiratory: Positive for cough. Negative for shortness of breath.   Cardiovascular: Negative for chest pain and palpitations.  Gastrointestinal: Negative for diarrhea and nausea.  Genitourinary: Negative for frequency.  Neurological: Negative for tingling.  Endo/Heme/Allergies: Negative for polydipsia.      HOME DIABETES REGIMEN:   Glipizide 5 mg BID  Metformin XR 500 mg Two tabs with breakfast and 2 tabs with supper   Bydureon at 2 mg weekly      CONTINUOUS GLUCOSE MONITORING RECORD INTERPRETATION    Dates of Recording: 4/23-12/18/2018  Sensor description: Dexcom   Results statistics:   CGM use % of time 61.3  Average and SD 173/49  Time in range   58.5    %  % Time Above 180 41.1  % Time above 250 6.3  % Time Below target 0.4    Glycemic patterns summary: she was noted with hyperglycemia after breakfast , no hyperglycemia with lunch and less hyperglycemia with supper    Hypoglycemic episodes occurred none  Overnight periods: stable       HISTORY:  Past Medical History:  Past Medical History:  Diagnosis Date  .  Anxiety   . Arthritis    "was in my knees before OR; now in my hands" (05/05/2014)  . Bronchitis   . Chronic bronchitis ([place])    "used to get it q yr; not as much since septal repair in 2004"  . Depression   . GERD (gastroesophageal reflux disease)   . High triglycerides   . HSV infection   . Hyperlipidemia   . Hypertension   . Left knee DJD   . Obesity   . Primary localized osteoarthritis of right knee   . Restless leg syndrome   . Sleep apnea    mild; does not wear CPAP  . Type II diabetes mellitus ([place])   .  Urgency of urination    and frequency takes Vesicare  . Urinary incontinence   . Walking pneumonia ~ 2013   Past Surgical History:  Past Surgical History:  Procedure Laterality Date  . COLONOSCOPY    . HYSTEROSCOPY  11/94   submuous myomas  . KNEE ARTHROSCOPY Right ~ 2010  . KNEE ARTHROSCOPY W/ MENISCECTOMY Right 2010  . NASAL SEPTUM SURGERY    . REFRACTIVE SURGERY Bilateral ~ 2005  . TONSILLECTOMY  ~ 2004  . TOTAL KNEE ARTHROPLASTY Left 08/11/2013   Procedure: LEFT TOTAL KNEE ARTHROPLASTY;  Surgeon: Lorn Junes, MD;  Location: [facility];  Service: Orthopedics;  Laterality: Left;  . TOTAL KNEE ARTHROPLASTY Right 05/04/2014   Procedure: RIGHT TOTAL KNEE ARTHROPLASTY;  Surgeon: Lorn Junes, MD;  Location: [facility];  Service: Orthopedics;  Laterality: Right;  . UVULOPALATOPHARYNGOPLASTY, TONSILLECTOMY AND SEPTOPLASTY  2004  . VAGINAL HYSTERECTOMY  06/1993    Social History:  reports that she has never smoked. She has never used smokeless tobacco. She reports current alcohol use of about 1.0 standard drinks of alcohol per week. She reports that she does not use drugs. Family History:  Family History  Problem Relation Age of Onset  . Heart disease Mother   . Hypertension Mother   . Stroke Mother   . Breast cancer Mother   . Diabetes Mellitus II Father   . Cancer Father        CLL  . Hypertension Brother   . Breast cancer Paternal Grandmother   . Breast cancer Maternal Grandmother      HOME MEDICATIONS: Allergies as of 12/19/2018      Reactions   Celebrex [celecoxib] Swelling   Lisinopril Cough   Meloxicam Swelling   Nsaids Swelling   Penicillins Other (See Comments)   Drop in blood pressure   Statins Other (See Comments)   Myalgias, edema   Lidocaine Rash   "bad taste in mouth" with dental work      Medication List       Accurate as of Dec 19, 2018  8:02 AM. If you have any questions, ask your nurse or doctor.        diltiazem 240 MG 24 hr capsule Commonly  known as:  CARDIZEM CD Take 1 capsule (240 mg total) by mouth daily.   Exenatide ER 2 MG/0.85ML Auij Commonly known as:  Bydureon BCise Inject 2 mg as directed once a week.   Bydureon 2 MG Pen Generic drug:  Exenatide ER INJECT 2 MG AS DIRECTED ONCE A WEEK   fenofibrate 160 MG tablet TAKE 1 TABLET BY MOUTH  DAILY   fesoterodine 8 MG Tb24 tablet Commonly known as:  Toviaz Take 1 tablet (8 mg total) by mouth daily.   glipiZIDE 5 MG tablet Commonly  known as:  GLUCOTROL Take 1 tablet (5 mg total) by mouth 2 (two) times daily with a meal.   glucose blood test strip Commonly known as:  ONE TOUCH ULTRA TEST Use to check blood sugars 3 times daily   losartan-hydrochlorothiazide 100-25 MG tablet Commonly known as:  HYZAAR Take 1 tablet by mouth daily.   Melatonin 1 MG Tabs Take 1 mg by mouth at bedtime.   metFORMIN 500 MG 24 hr tablet Commonly known as:  GLUCOPHAGE-XR Take 4 tablets by mouth once daily.   multivitamin with minerals Tabs tablet Take 1 tablet by mouth daily.   omeprazole 20 MG capsule Commonly known as:  PRILOSEC TAKE 1 CAPSULE BY MOUTH  DAILY 30 TO 60 MINUTES  BEFORE BREAKFAST   ONE TOUCH SURESOFT Misc Use 1 lancet per test. Test blood sugars 1-4 times per day as instructed.   Osteo Bi-Flex Adv Joint Shield Tabs   PARoxetine 40 MG tablet Commonly known as:  PAXIL TAKE 1 TABLET(40 MG) BY MOUTH EVERY MORNING   pravastatin 40 MG tablet Commonly known as:  PRAVACHOL Take 1 tablet (40 mg total) by mouth daily.   rOPINIRole 2 MG tablet Commonly known as:  REQUIP Take 2 tablets at bedtime What changed:    how much to take  additional instructions   VITAMIN C PO Take by mouth daily.        DATA REVIEWED:  Lab Results  Component Value Date   HGBA1C 6.7 (A) 09/20/2018   HGBA1C 8.0 (H) 06/26/2018   HGBA1C 6.5 12/21/2017    Lab Results  Component Value Date   CHOL 164 06/26/2018   HDL 38.40 (L) 06/26/2018   LDLCALC 101 (H) 06/22/2017    LDLDIRECT 108.0 06/26/2018   TRIG 232.0 (H) 06/26/2018   CHOLHDL 4 06/26/2018         ASSESSMENT / PLAN / RECOMMENDATIONS:   1)Type 2 Diabetes Mellitus, Sub-Optimally controlled, With neuropathic complications - Most recent A1c of  6.7 %. Goal A1c < 7.0 %.     -Her BG's have been stable. Encouraged continuing to exercise, as she has been walking ~ 5 days a week, we discussed low CHO options for snack  And to try her best in avoiding them at night. We discussed options such as walking after supper to improve insulin sensitivity .     MEDICATIONS: - Continue Metformin XR 500 at 2 tablets with meals Twice a day  - Continue Glipizide 5 mg  BID with meals  - Continue Bydureon 2 mg weekly   EDUCATION / INSTRUCTIONS:  BG monitoring instructions: Patient is instructed to check her blood sugars 2 times a day, fasting and bedtime   Call [facility] if: BG persistently < 70 or > 300. . I reviewed the Rule of 15 for the treatment of hypoglycemia in detail with the patient. Literature supplied.   2) Diabetic complications:   Eye: Does not have known diabetic retinopathy.   Neuro/ Feet: Does have known diabetic peripheral neuropathy.based on exam   Renal: Patient does not have known baseline CKD. She is  on an ACEI/ARB at present.   3) Hypertriglyceridemia: Patient is  on a statin, her last LDL was not measured due to high TG. We discussed low CHO and diabetes control will improve this,. Prior to that her LDL was above 100 mg/dL. If this continues I would recommend switching her statin to either crestor or lipitor.      I discussed the assessment and treatment  plan with the patient. The patient was provided an opportunity to ask questions and all were answered. The patient agreed with the plan and demonstrated an understanding of the instructions.   The patient was advised to call back or seek an in-person evaluation if the symptoms worsen or if the condition fails  to improve as anticipated.    F/U in 3 months    Signed electronically by: Mack Guise, MD  [facility] End[facility]  [address] [place],  [address] Phone: [phone] FAX: [phone]   CC: Lance Sell, NP No address on file Phone: None  Fax: None  Return to Endocrinology clinic as below: Future Appointments  Date Time Provider [place]  01/13/2019  2:30 PM [facility]  04/02/2019  8:00 AM Ward Givens, NP [facility] None

## 2018-12-23 ENCOUNTER — Ambulatory Visit: Payer: 59

## 2019-01-07 ENCOUNTER — Other Ambulatory Visit: Payer: Self-pay | Admitting: *Deleted

## 2019-01-07 MED ORDER — EXENATIDE ER 2 MG ~~LOC~~ PEN: PEN_INJECTOR | SUBCUTANEOUS | 2 refills | Status: DC

## 2019-01-13 ENCOUNTER — Ambulatory Visit: Payer: 59

## 2019-01-16 ENCOUNTER — Telehealth: Payer: Self-pay | Admitting: Nurse Practitioner

## 2019-01-16 NOTE — Telephone Encounter (Signed)
LVM for patient to call back to make appt for CPE

## 2019-01-16 NOTE — Telephone Encounter (Signed)
-----   Message from Biagio Borg, MD sent at 12/15/2018 11:43 AM EDT ----- Regarding: transfer of care Ms Diane Gregory or other [place],  Pt is due for CPX after Jun 27, 2019  Please contact this patient, and ask them to schedule with me as new PCP  in order for the patient to continue have updated ongoing care without being lost in the system, and to keep up a high standard of care at [facility].  These visits can be In Person (if no fever symptoms), or Virtual.  I would prefer in person during day hours at the office, but either is ok.  Please be aware I would like 4 [place] (virtual) visits if possible each Mon/Tues/Wed/Thur evenings that I can do from home.  The template has been changed to reflect the ability to do this, and I will just need to be aware to do this by watching my schedule.

## 2019-01-17 NOTE — Telephone Encounter (Signed)
Left patient vm °

## 2019-01-17 NOTE — Telephone Encounter (Signed)
Pt called back to return call and set up appt. Please advise.

## 2019-01-20 NOTE — Telephone Encounter (Signed)
Appointment has been made for 11/17 with Dr.John.

## 2019-02-13 ENCOUNTER — Telehealth: Payer: Self-pay | Admitting: *Deleted

## 2019-02-13 NOTE — Telephone Encounter (Signed)
Follow-up on diagnostic breast imaging appointment.  Screening mammogram note on Epic appointment desk at [facility] for 02-18-19. Previous report recommends breast MRI. Per Lilia Pro, breast MRI was not covered by insurance.  Case reviewed by Dr Jeanmarie Plant and approved for screening mammogram.   Recall updated to 03-13-19. Routing to Dr Quincy Simmonds. Encounter closed.

## 2019-02-20 ENCOUNTER — Ambulatory Visit
Admission: RE | Admit: 2019-02-20 | Discharge: 2019-02-20 | Disposition: A | Discharge status: Home / Self Care | Payer: 59 | Source: Ambulatory Visit | Attending: Obstetrics and Gynecology | Admitting: Obstetrics and Gynecology

## 2019-02-20 ENCOUNTER — Other Ambulatory Visit: Payer: Self-pay

## 2019-02-20 DIAGNOSIS — Z1231 Encounter for screening mammogram for malignant neoplasm of breast: Secondary | ICD-10-CM

## 2019-03-13 ENCOUNTER — Other Ambulatory Visit: Payer: Self-pay

## 2019-03-18 ENCOUNTER — Encounter: Payer: Self-pay | Admitting: Internal Medicine

## 2019-03-18 ENCOUNTER — Ambulatory Visit: Payer: 59 | Admitting: Internal Medicine

## 2019-03-18 ENCOUNTER — Other Ambulatory Visit: Payer: Self-pay

## 2019-03-18 VITALS — BP 138/72 | HR 78 | Temp 98.1°F | Ht 64.0 in | Wt 249.6 lb

## 2019-03-18 DIAGNOSIS — E1165 Type 2 diabetes mellitus with hyperglycemia: Secondary | ICD-10-CM | POA: Diagnosis not present

## 2019-03-18 DIAGNOSIS — M7660 Achilles tendinitis, unspecified leg: Secondary | ICD-10-CM | POA: Insufficient documentation

## 2019-03-18 DIAGNOSIS — E1142 Type 2 diabetes mellitus with diabetic polyneuropathy: Secondary | ICD-10-CM

## 2019-03-18 LAB — POCT GLYCOSYLATED HEMOGLOBIN (HGB A1C): Hemoglobin A1C: 6.9 % — AB (ref 4.0–5.6)

## 2019-03-18 NOTE — Progress Notes (Signed)
Name: Diane Gregory  Age/ Sex: 63 y.o., female   MRN/ DOB: [id number], 11/14/1955     PCP: Biagio Borg, MD   Reason for Endocrinology Evaluation: Type 2 Diabetes Mellitus  Initial Endocrine Consultative Visit: 08/02/18    PATIENT IDENTIFIER: Diane Gregory is a 63 y.o. female with a past medical history of HTN, OSA, T2DM and DJD . The patient has followed with Endocrinology clinic since 08/02/18 for consultative assistance with management of her diabetes.  DIABETIC HISTORY:  Diane Gregory was diagnosed with T2DM in 2009, she has been on oral glycemic agents since her diagnosis, on her initial presentation to our clinic she was on Bydureon 2 mg weekly, glipizide ER 7-1/2 mg with breakfast, metformin XR 500 mg 2 tablets with breakfast. Her hemoglobin A1c has ranged from 6.5%  In 12/2017 , peaking at 8.3% in 2017  SUBJECTIVE:   During the last visit (12/19/2018): Her A1c was 6.7% .  We continued Glipizide Bydureon 2 mg weekly, and  Metformin.    Today (03/18/2019): Diane Gregory is here for a 3 month follow-up on her diabetes management. She checks her blood sugars before meals and at bedtime daily, through her CGM. The patient has not had hypoglycemic episodes since the last clinic visit. She continues to snack at bedtime sometimes its cereal and at times its cheese. Otherwise, the patient has not required any recent emergency interventions for hypoglycemia and has not had recent hospitalizations secondary to hyper or hypoglycemic episodes.   Today she is c/o exacerbation of left foot plantar fasciitis as well as posterior ankle pain and worsening of her tingling over the past few weeks.   ROS: As per HPI and as detailed below: Review of Systems  Constitutional: Negative for fever and weight loss.  HENT: Negative for congestion and sore throat.   Respiratory: Negative for cough and shortness of breath.   Cardiovascular: Negative for chest pain and palpitations.  Gastrointestinal: Negative  for diarrhea and nausea.  Genitourinary: Negative for frequency.  Neurological: Negative for tingling.  Endo/Heme/Allergies: Negative for polydipsia.      HOME DIABETES REGIMEN:   Glipizide 5 mg BID  Metformin XR 500 mg Two tabs BID  Bydureon at 2 mg weekly      CONTINUOUS GLUCOSE MONITORING RECORD INTERPRETATION    Dates of Recording: 7/22-03/18/2019  Sensor description: Dexcom   Results statistics:   CGM use % of time 93  Average and SD 186/41  Time in range   58.5    %  % Time Above 180 48  % Time above 250 45  % Time Below target 0    Glycemic patterns summary: she was noted with hyperglycemia in early morning hours and after supper   Hypoglycemic episodes occurred none  Overnight periods: stable but high       HISTORY:  Past Medical History:  Past Medical History:  Diagnosis Date  . Anxiety   . Arthritis    "was in my knees before OR; now in my hands" (05/05/2014)  . Bronchitis   . Chronic bronchitis ([place])    "used to get it q yr; not as much since septal repair in 2004"  . Depression   . GERD (gastroesophageal reflux disease)   . High triglycerides   . HSV infection   . Hyperlipidemia   . Hypertension   . Left knee DJD   . Obesity   . Primary localized osteoarthritis of right knee   .  Restless leg syndrome   . Sleep apnea    mild; does not wear CPAP  . Type II diabetes mellitus ([place])   . Urgency of urination    and frequency takes Vesicare  . Urinary incontinence   . Walking pneumonia ~ 2013   Past Surgical History:  Past Surgical History:  Procedure Laterality Date  . COLONOSCOPY    . HYSTEROSCOPY  11/94   submuous myomas  . KNEE ARTHROSCOPY Right ~ 2010  . KNEE ARTHROSCOPY W/ MENISCECTOMY Right 2010  . NASAL SEPTUM SURGERY    . REFRACTIVE SURGERY Bilateral ~ 2005  . TONSILLECTOMY  ~ 2004  . TOTAL KNEE ARTHROPLASTY Left 08/11/2013   Procedure: LEFT TOTAL KNEE ARTHROPLASTY;  Surgeon: Lorn Junes, MD;  Location: [place];   Service: Orthopedics;  Laterality: Left;  . TOTAL KNEE ARTHROPLASTY Right 05/04/2014   Procedure: RIGHT TOTAL KNEE ARTHROPLASTY;  Surgeon: Lorn Junes, MD;  Location: [place];  Service: Orthopedics;  Laterality: Right;  . UVULOPALATOPHARYNGOPLASTY, TONSILLECTOMY AND SEPTOPLASTY  2004  . VAGINAL HYSTERECTOMY  06/1993    Social History:  reports that she has never smoked. She has never used smokeless tobacco. She reports current alcohol use of about 1.0 standard drinks of alcohol per week. She reports that she does not use drugs. Family History:  Family History  Problem Relation Age of Onset  . Heart disease Mother   . Hypertension Mother   . Stroke Mother   . Breast cancer Mother   . Diabetes Mellitus II Father   . Cancer Father        CLL  . Hypertension Brother   . Breast cancer Paternal Grandmother   . Breast cancer Maternal Grandmother      HOME MEDICATIONS: Allergies as of 03/18/2019      Reactions   Celebrex [celecoxib] Swelling   Lisinopril Cough   Meloxicam Swelling   Nsaids Swelling   Penicillins Other (See Comments)   Drop in blood pressure   Statins Other (See Comments)   Myalgias, edema   Lidocaine Rash   "bad taste in mouth" with dental work      Medication List       Accurate as of March 18, 2019  8:25 AM. If you have any questions, ask your nurse or doctor.        diltiazem 240 MG 24 hr capsule Commonly known as: CARDIZEM CD Take 1 capsule (240 mg total) by mouth daily.   Exenatide ER 2 MG/0.85ML Auij Commonly known as: Bydureon BCise Inject 2 mg as directed once a week.   Exenatide ER 2 MG Pen Commonly known as: Bydureon INJECT 2 MG AS DIRECTED ONCE A WEEK. NEED TO ESTABLISH WITH NEW PROVIDER.   fenofibrate 160 MG tablet TAKE 1 TABLET BY MOUTH  DAILY   fesoterodine 8 MG Tb24 tablet Commonly known as: Toviaz Take 1 tablet (8 mg total) by mouth daily.   glipiZIDE 5 MG tablet Commonly known as: GLUCOTROL Take 1 tablet (5 mg total) by mouth  2 (two) times daily with a meal.   glucose blood test strip Commonly known as: ONE TOUCH ULTRA TEST Use to check blood sugars 3 times daily   losartan-hydrochlorothiazide 100-25 MG tablet Commonly known as: HYZAAR Take 1 tablet by mouth daily.   Melatonin 1 MG Tabs Take 1 mg by mouth at bedtime.   metFORMIN 500 MG 24 hr tablet Commonly known as: GLUCOPHAGE-XR Take 4 tablets by mouth once daily.   multivitamin with minerals  Tabs tablet Take 1 tablet by mouth daily.   omeprazole 20 MG capsule Commonly known as: PRILOSEC TAKE 1 CAPSULE BY MOUTH  DAILY 30 TO 60 MINUTES  BEFORE BREAKFAST   ONE TOUCH SURESOFT Misc Use 1 lancet per test. Test blood sugars 1-4 times per day as instructed.   Osteo Bi-Flex Adv Joint Shield Tabs   PARoxetine 40 MG tablet Commonly known as: PAXIL TAKE 1 TABLET(40 MG) BY MOUTH EVERY MORNING   pravastatin 40 MG tablet Commonly known as: PRAVACHOL Take 1 tablet (40 mg total) by mouth daily.   rOPINIRole 2 MG tablet Commonly known as: REQUIP Take 2 tablets at bedtime What changed:   how much to take  additional instructions   VITAMIN C PO Take by mouth daily.      PHYSICAL EXAM: VS: BP 138/72 (BP Location: Left Arm, Patient Position: Sitting, Cuff Size: Normal)   Pulse 78   Temp 98.1 F (36.7 C)   Ht 5\' 4"  (1.626 m)   Wt 249 lb 9.6 oz (113.2 kg)   LMP 08/14/1994 (Within Months)   SpO2 98%   BMI 42.84 kg/m    EXAM: General: Pt appears well and is in NAD  Neck: General: Supple without adenopathy. Thyroid: Thyroid size normal.  No goiter or nodules appreciated. No thyroid bruit.  Lungs: Clear with good BS bilat with no rales, rhonchi, or wheezes  Heart: Auscultation: RRR with normal S1 and S2, no gallops or murmurs Carotid arteries: no bruits Periph. circulation: no peripheral edema  Abdomen: Normoactive bowel sounds, soft, nontender, without masses or organomegaly palpable  Extremities: BL LE: Trace pretibial edema. Pt with  tenderness over the left achilles tendon  But no erythema or swelling   Skin: Hair: texture and amount normal with gender appropriate distribution Skin Inspection: no rashes. Skin Palpation: skin temperature, texture, and thickness normal to palpation  Neuro: Cranial nerves: II - XII grossly intact ;  Motor: normal strength throughout DTRs: 2+ and symmetric in UE without delay in relaxation phase  Mental Status: Judgment, insight: intact Orientation: oriented to time, place, and person Mood and affect: no depression, anxiety, or agitation     DATA REVIEWED:  Lab Results  Component Value Date   HGBA1C 6.9 (A) 03/18/2019   HGBA1C 6.7 (A) 09/20/2018   HGBA1C 8.0 (H) 06/26/2018    Lab Results  Component Value Date   CHOL 164 06/26/2018   HDL 38.40 (L) 06/26/2018   LDLCALC 101 (H) 06/22/2017   LDLDIRECT 108.0 06/26/2018   TRIG 232.0 (H) 06/26/2018   CHOLHDL 4 06/26/2018         ASSESSMENT / PLAN / RECOMMENDATIONS:   1)Type 2 Diabetes Mellitus, Optimally Controlled, With Neuropathic complications - Most recent A1c of  6.9 %. Goal A1c < 7.0 %.     - Her A1c continues to be at goal but her DexCom download shows worsening glucose control over the past 2 weeks, we again emphasized the importance of diet and exercise. I have encouraged her to take walks before supper, we also discussed low CHO options for bedtime snacks. - If BG's continue to rise, we discussed increased Glipizide vs adding an SGLT-2 inhibitors.  - I have advised her to contact pharmacy about metformin XR recall.   MEDICATIONS: - Continue Metformin XR 500 at 2 tablets with meals Twice a day  - Continue Glipizide 5 mg  BID with meals  - Continue Bydureon 2 mg weekly   EDUCATION / INSTRUCTIONS:  BG monitoring instructions: Patient  is instructed to check her blood sugars 2 times a day, fasting and bedtime   Call [facility] if: BG persistently < 70 or > 300. . I reviewed the Rule of 15 for the  treatment of hypoglycemia in detail with the patient. Literature supplied.   2) Diabetic complications:   Eye: Does not have known diabetic retinopathy.   Neuro/ Feet: Does have known diabetic peripheral neuropathy.based on exam   Renal: Patient does not have known baseline CKD. She is  on an ACEI/ARB at present.   3) Left foot achilles tendon pain/ Plantar fascitis:   - She was advised to use tylenol as needed for the pain, we also discussed applying ice packs 3x daily.  - Discussed stretching exercise for plantar fasciitis    F/U in 3 months    Signed electronically by: Mack Guise, MD  [facility]  [address] [place], [place] [address] Phone: [phone] FAX: [phone]   CC: Biagio Borg, MD [place] [address] Phone: [phone]  Fax: [phone]  Return to Endocrinology clinic as below: Future Appointments  Date Time Provider [place]  04/02/2019  8:00 AM Ward Givens, NP [facility] None  06/19/2019 11:10 AM Zeven Kocak, Melanie Crazier, MD [facility] None  07/01/2019  9:40 AM Biagio Borg, MD [facility]

## 2019-03-18 NOTE — Patient Instructions (Signed)
-   Continue Metformin XR 500 at 2 tablets with meals Twice a day  - Take Glipizide 5 mg twice a day with meals - Continue Bydureon weekly     -HOW TO TREAT LOW BLOOD SUGARS (Blood sugar LESS THAN 70 MG/DL)  Please follow the RULE OF 15 for the treatment of hypoglycemia treatment (when your (blood sugars are less than 70 mg/dL)    STEP 1: Take 15 grams of carbohydrates when your blood sugar is low, which includes:   3-4 GLUCOSE TABS  OR  3-4 OZ OF JUICE OR REGULAR SODA OR  ONE TUBE OF GLUCOSE GEL     STEP 2: RECHECK blood sugar in 15 MINUTES STEP 3: If your blood sugar is still low at the 15 minute recheck --> then, go back to STEP 1 and treat AGAIN with another 15 grams of carbohydrates.

## 2019-03-31 ENCOUNTER — Telehealth: Payer: Self-pay | Admitting: Obstetrics and Gynecology

## 2019-03-31 NOTE — Telephone Encounter (Signed)
Call to patient. Her nieces have recently tested posiitve for CHEK 2 mutation but her sister-in-law was negative. Indication this is coming from brothers side of family and she needs testing. Last seen in office 12-2017 for annual. Appointment scheduled for 04-07-19 for annual and review genetic testing.  Routing to Dr Quincy Simmonds. Encounter closed.

## 2019-03-31 NOTE — Telephone Encounter (Signed)
Patient want to have genetic testing for breast cancer.

## 2019-04-02 ENCOUNTER — Other Ambulatory Visit: Payer: Self-pay

## 2019-04-02 ENCOUNTER — Ambulatory Visit: Payer: 59 | Admitting: Adult Health

## 2019-04-02 ENCOUNTER — Encounter: Payer: Self-pay | Admitting: Adult Health

## 2019-04-02 VITALS — BP 132/84 | HR 72 | Temp 96.6°F | Ht 64.0 in | Wt 245.2 lb

## 2019-04-02 DIAGNOSIS — G4733 Obstructive sleep apnea (adult) (pediatric): Secondary | ICD-10-CM | POA: Diagnosis not present

## 2019-04-02 NOTE — Patient Instructions (Signed)
Continue using CPAP nightly and greater than 4 hours each night Follow with DME If your symptoms worsen or you develop new symptoms please let us know.

## 2019-04-02 NOTE — Progress Notes (Addendum)
PATIENT: Diane Gregory DOB: Sep 04, 1955  REASON FOR VISIT: follow up HISTORY FROM: patient  HISTORY OF PRESENT ILLNESS: Today 04/02/19:  [name] is a 63 year old female with a history of obstructive sleep apnea on BiPAP.  She returns today for follow-up.  Her download indicates that she use her machine 26 out of 30 days for compliance of 87%.  She used her machine greater than 4 hours only 9 days for compliance of 30%.  She states that the air is extremely hot and she has a hard time keeping the mask on.  She also states that the machine is making a noise.  She has an appointment set up with her DME to have them look at this.  When she uses her machine her AHI is 1.1.  She remains on a pressure of 18/14 with pressure support of 4.  She returns today for an evaluation.   HISTORY 03/27/18:  A 63 year old female with a history of obstructive sleep apnea on BiPAP.  She returns today for follow-up.  Her CPAP download indicates that she use her machine 28 out of 30 days for compliance of 93%.  She used her machine greater than 4 hours 22 days for compliance of 73%.  On average she uses her machine 5 hours and 55 minutes.  Her residual AHI is 0.8 on 18/14 cn H20 and PS 4 cmH20.  She reports that the Bipap continues to work well for her.  Her Epworth sleepiness score is 5 and fatigue severity score is 20.  She returns today for evaluation.  REVIEW OF SYSTEMS: Out of a complete 14 system review of symptoms, the patient complains only of the following symptoms, and all other reviewed systems are negative.  See HPI fatigue severity score 19  ALLERGIES: Allergies  Allergen Reactions  . Celebrex [Celecoxib] Swelling  . Lisinopril Cough  . Meloxicam Swelling  . Nsaids Swelling  . Penicillins Other (See Comments)    Drop in blood pressure  . Statins Other (See Comments)    Myalgias, edema Other reaction(s): Other (See Comments) edema Other reaction(s): Other (See Comments) Myalgias, edema   . Lidocaine Rash    "bad taste in mouth" with dental work    HOME MEDICATIONS: Outpatient Medications Prior to Visit  Medication Sig Dispense Refill  . Ascorbic Acid (VITAMIN C PO) Take by mouth daily.    Marland Kitchen diltiazem (CARDIZEM CD) 240 MG 24 hr capsule Take 1 capsule (240 mg total) by mouth daily. 90 capsule 3  . Exenatide ER (BYDUREON) 2 MG PEN INJECT 2 MG AS DIRECTED ONCE A WEEK. NEED TO ESTABLISH WITH NEW PROVIDER. 4 each 2  . fenofibrate 160 MG tablet TAKE 1 TABLET BY MOUTH  DAILY 90 tablet 3  . fesoterodine (TOVIAZ) 8 MG TB24 tablet Take 1 tablet (8 mg total) by mouth daily. 90 tablet 3  . glipiZIDE (GLUCOTROL) 5 MG tablet Take 1 tablet (5 mg total) by mouth 2 (two) times daily with a meal. 180 tablet 3  . losartan-hydrochlorothiazide (HYZAAR) 100-25 MG tablet Take 1 tablet by mouth daily. 90 tablet 3  . Melatonin 1 MG TABS Take 1 mg by mouth at bedtime.    . metFORMIN (GLUCOPHAGE-XR) 500 MG 24 hr tablet Take 4 tablets by mouth once daily. 360 tablet 1  . Misc Natural Products (OSTEO BI-FLEX ADV JOINT SHIELD) TABS     . Multiple Vitamin (MULTIVITAMIN WITH MINERALS) TABS tablet Take 1 tablet by mouth daily.    Marland Kitchen omeprazole (  PRILOSEC) 20 MG capsule TAKE 1 CAPSULE BY MOUTH  DAILY 30 TO 60 MINUTES  BEFORE BREAKFAST 90 capsule 2  . PARoxetine (PAXIL) 40 MG tablet TAKE 1 TABLET(40 MG) BY MOUTH EVERY MORNING 90 tablet 3  . pravastatin (PRAVACHOL) 40 MG tablet Take 1 tablet (40 mg total) by mouth daily. 90 tablet 2  . rOPINIRole (REQUIP) 2 MG tablet Take 2 tablets at bedtime (Patient taking differently: 2 mg. Take 1 tablets at bedtime) 180 tablet 3  . Exenatide ER (BYDUREON BCISE) 2 MG/0.85ML AUIJ Inject 2 mg as directed once a week. 12 pen 2  . glucose blood (ONE TOUCH ULTRA TEST) test strip Use to check blood sugars 3 times daily (Patient not taking: Reported on 03/18/2019) 100 each 12  . Lancets Misc. (ONE TOUCH SURESOFT) MISC Use 1 lancet per test. Test blood sugars 1-4 times per day as  instructed. 1 each 1   No facility-administered medications prior to visit.     PAST MEDICAL HISTORY: Past Medical History:  Diagnosis Date  . Anxiety   . Arthritis    "was in my knees before OR; now in my hands" (05/05/2014)  . Bronchitis   . Chronic bronchitis ([place])    "used to get it q yr; not as much since septal repair in 2004"  . Depression   . GERD (gastroesophageal reflux disease)   . High triglycerides   . HSV infection   . Hyperlipidemia   . Hypertension   . Left knee DJD   . Obesity   . Primary localized osteoarthritis of right knee   . Restless leg syndrome   . Sleep apnea    mild; does not wear CPAP  . Type II diabetes mellitus ([place])   . Urgency of urination    and frequency takes Vesicare  . Urinary incontinence   . Walking pneumonia ~ 2013    PAST SURGICAL HISTORY: Past Surgical History:  Procedure Laterality Date  . COLONOSCOPY    . HYSTEROSCOPY  11/94   submuous myomas  . KNEE ARTHROSCOPY Right ~ 2010  . KNEE ARTHROSCOPY W/ MENISCECTOMY Right 2010  . NASAL SEPTUM SURGERY    . REFRACTIVE SURGERY Bilateral ~ 2005  . TONSILLECTOMY  ~ 2004  . TOTAL KNEE ARTHROPLASTY Left 08/11/2013   Procedure: LEFT TOTAL KNEE ARTHROPLASTY;  Surgeon: Lorn Junes, MD;  Location: [place];  Service: Orthopedics;  Laterality: Left;  . TOTAL KNEE ARTHROPLASTY Right 05/04/2014   Procedure: RIGHT TOTAL KNEE ARTHROPLASTY;  Surgeon: Lorn Junes, MD;  Location: [place];  Service: Orthopedics;  Laterality: Right;  . UVULOPALATOPHARYNGOPLASTY, TONSILLECTOMY AND SEPTOPLASTY  2004  . VAGINAL HYSTERECTOMY  06/1993    FAMILY HISTORY: Family History  Problem Relation Age of Onset  . Heart disease Mother   . Hypertension Mother   . Stroke Mother   . Breast cancer Mother   . Diabetes Mellitus II Father   . Cancer Father        CLL  . Hypertension Brother   . Breast cancer Paternal Grandmother   . Breast cancer Maternal Grandmother     SOCIAL HISTORY: Social History    Socioeconomic History  . Marital status: Widowed    Spouse name: Not on file  . Number of children: 2  . Years of education: 59  . Highest education level: Not on file  Occupational History  . Occupation: Intake Coordinator    Comment: administration  Social Needs  . Financial resource strain: Not on file  .  Food insecurity    Worry: Not on file    Inability: Not on file  . Transportation needs    Medical: No    Non-medical: No  Tobacco Use  . Smoking status: Never Smoker  . Smokeless tobacco: Never Used  Substance and Sexual Activity  . Alcohol use: Yes    Alcohol/week: 1.0 standard drinks    Types: 1 Standard drinks or equivalent per week    Comment: 05/05/2014 "might have a drink once/month"  . Drug use: No  . Sexual activity: Not Currently    Partners: Male    Birth control/protection: Surgical    Comment: TVH 1994  Lifestyle  . Physical activity    Days per week: Not on file    Minutes per session: Not on file  . Stress: Not on file  Relationships  . Social Herbalist on phone: Not on file    Gets together: Not on file    Attends religious service: Not on file    Active member of club or organization: Not on file    Attends meetings of clubs or organizations: Not on file    Relationship status: Not on file  . Intimate partner violence    Fear of current or ex partner: Not on file    Emotionally abused: Not on file    Physically abused: Not on file    Forced sexual activity: Not on file  Other Topics Concern  . Not on file  Social History Narrative   Currently single. Fun: Crafts, flea markets, antiquing, travel, reading   Denies religious beliefs that effect health care.    Caffeine: 2-3 drinks a day       PHYSICAL EXAM  Vitals:   04/02/19 0750  BP: 132/84  Pulse: 72  Temp: (!) 96.6 F (35.9 C)  Weight: 245 lb 3.2 oz (111.2 kg)  Height: 5\' 4"  (1.626 m)   Body mass index is 42.09 kg/m.  Generalized: Well developed, in no acute  distress  Chest: Lungs clear to auscultation bilaterally  Neurological examination  Mentation: Alert oriented to time, place, history taking. Follows all commands speech and language fluent Cranial nerve II-XII:  Extraocular movements were full, visual field were full on confrontational test. Facial sensation and strength were normal. Uvula tongue midline. Head turning and shoulder shrug  were normal and symmetric. Motor: The motor testing reveals 5 over 5 strength of all 4 extremities. Good symmetric motor tone is noted throughout.  Sensory: Sensory testing is intact to soft touch on all 4 extremities. No evidence of extinction is noted.  Gait and station: Gait is normal.    DIAGNOSTIC DATA (LABS, IMAGING, TESTING) - I reviewed patient records, labs, notes, testing and imaging myself where available.  Lab Results  Component Value Date   WBC 7.7 06/26/2018   HGB 13.7 06/26/2018   HCT 39.4 06/26/2018   MCV 84.4 06/26/2018   PLT 215.0 06/26/2018      Component Value Date/Time   NA 138 06/26/2018 0841   K 4.0 06/26/2018 0841   CL 100 06/26/2018 0841   CO2 28 06/26/2018 0841   GLUCOSE 212 (H) 06/26/2018 0841   BUN 18 06/26/2018 0841   CREATININE 0.58 06/26/2018 0841   CALCIUM 9.4 06/26/2018 0841   PROT 7.2 06/26/2018 0841   ALBUMIN 4.4 06/26/2018 0841   AST 51 (H) 06/26/2018 0841   ALT 57 (H) 06/26/2018 0841   ALKPHOS 106 06/26/2018 0841   BILITOT 0.4 06/26/2018 0841  GFRNONAA >60 12/04/2015 1417   GFRAA >60 12/04/2015 1417   Lab Results  Component Value Date   CHOL 164 06/26/2018   HDL 38.40 (L) 06/26/2018   LDLCALC 101 (H) 06/22/2017   LDLDIRECT 108.0 06/26/2018   TRIG 232.0 (H) 06/26/2018   CHOLHDL 4 06/26/2018   Lab Results  Component Value Date   HGBA1C 6.9 (A) 03/18/2019   No results found for: VITAMINB12 Lab Results  Component Value Date   TSH 1.30 06/22/2017      ASSESSMENT AND PLAN 63 y.o. year old female  has a past medical history of Anxiety,  Arthritis, Bronchitis, Chronic bronchitis ([place]), Depression, GERD (gastroesophageal reflux disease), High triglycerides, HSV infection, Hyperlipidemia, Hypertension, Left knee DJD, Obesity, Primary localized osteoarthritis of right knee, Restless leg syndrome, Sleep apnea, Type II diabetes mellitus ([place]), Urgency of urination, Urinary incontinence, and Walking pneumonia (~ 2013). here with:  1.  Obstructive sleep apnea on CPAP  The patient CPAP download shows suboptimal compliance however this is due to a malfunction with the machine.  She has an appointment with her DME company.  She is encouraged to continue using the machine nightly and greater than 4 hours each night.  She is advised that if her symptoms worsen or she develops new symptoms she should let us know.  She will follow-up in 1 year or sooner if needed   I spent 15 minutes with the patient. 50% of this time was spent reviewing CPAP download   Ward Givens, MSN, NP-C 04/02/2019, 8:10 AM [facility] [address] [place], [place] [address] [phone]  I reviewed the above note and documentation by the Nurse Practitioner and agree with the history, physical exam, assessment and plan as outlined above. I was immediately available for consultation. Star Age, MD, PhD [facility])

## 2019-04-03 ENCOUNTER — Other Ambulatory Visit: Payer: Self-pay

## 2019-04-03 NOTE — Progress Notes (Signed)
63 y.o. G108P2002 Widowed Caucasian female here for annual exam.    Nieces both have CHEK2 gene mutation, inherited patient's brother.   Has a glucose monitor for continuous monitoring.  A1C is 6.9.   Retired 11-13-18.  PCP: Cathlean Cower, MD  Patient's last menstrual period was 08/14/1994 (within months).           Sexually active: No.  The current method of family planning is status post hysterectomy.    Exercising: Yes.    walks and occ. stationary bike Smoker:  no  Health Maintenance: Pap: 2000 Neg History of abnormal Pap:  no MMG: 02-20-19 3D Neg/density A/BiRads1  Colonoscopy: 05-12-15 polyp;next due 04/2025 BMD:  2007  Result :normal TDaP:  2018 Gardasil:   n/a HIV: neg years ago Hep C:03-05-14 Neg Screening Labs:  PCP   reports that she has never smoked. She has never used smokeless tobacco. She reports previous alcohol use. She reports that she does not use drugs.  Past Medical History:  Diagnosis Date  . Anxiety   . Arthritis    "was in my knees before OR; now in my hands" (05/05/2014)  . Bronchitis   . Chronic bronchitis ([place])    "used to get it q yr; not as much since septal repair in 2004"  . Depression   . GERD (gastroesophageal reflux disease)   . High triglycerides   . HSV infection   . Hyperlipidemia   . Hypertension   . Left knee DJD   . Obesity   . Primary localized osteoarthritis of right knee   . Restless leg syndrome   . Sleep apnea    mild; does not wear CPAP  . Type II diabetes mellitus ([place])   . Urgency of urination    and frequency takes Vesicare  . Urinary incontinence   . Walking pneumonia ~ 2013    Past Surgical History:  Procedure Laterality Date  . COLONOSCOPY    . HYSTEROSCOPY  11/94   submuous myomas  . KNEE ARTHROSCOPY Right ~ 2010  . KNEE ARTHROSCOPY W/ MENISCECTOMY Right 2010  . NASAL SEPTUM SURGERY    . REFRACTIVE SURGERY Bilateral ~ 2005  . TONSILLECTOMY  ~ 2004  . TOTAL KNEE ARTHROPLASTY Left 08/11/2013   Procedure:  LEFT TOTAL KNEE ARTHROPLASTY;  Surgeon: Lorn Junes, MD;  Location: [facility];  Service: Orthopedics;  Laterality: Left;  . TOTAL KNEE ARTHROPLASTY Right 05/04/2014   Procedure: RIGHT TOTAL KNEE ARTHROPLASTY;  Surgeon: Lorn Junes, MD;  Location: [facility];  Service: Orthopedics;  Laterality: Right;  . UVULOPALATOPHARYNGOPLASTY, TONSILLECTOMY AND SEPTOPLASTY  2004  . VAGINAL HYSTERECTOMY  06/1993    Current Outpatient Medications  Medication Sig Dispense Refill  . Ascorbic Acid (VITAMIN C PO) Take by mouth daily.    Marland Kitchen diltiazem (CARDIZEM CD) 240 MG 24 hr capsule Take 1 capsule (240 mg total) by mouth daily. 90 capsule 3  . Exenatide ER (BYDUREON) 2 MG PEN INJECT 2 MG AS DIRECTED ONCE A WEEK. NEED TO ESTABLISH WITH NEW PROVIDER. 4 each 2  . fenofibrate 160 MG tablet TAKE 1 TABLET BY MOUTH  DAILY 90 tablet 3  . fesoterodine (TOVIAZ) 8 MG TB24 tablet Take 1 tablet (8 mg total) by mouth daily. 90 tablet 3  . glipiZIDE (GLUCOTROL) 5 MG tablet Take 1 tablet (5 mg total) by mouth 2 (two) times daily with a meal. 180 tablet 3  . losartan-hydrochlorothiazide (HYZAAR) 100-25 MG tablet Take 1 tablet by mouth daily. 90 tablet 3  .  Melatonin 1 MG TABS Take 1 mg by mouth at bedtime.    . metFORMIN (GLUCOPHAGE-XR) 500 MG 24 hr tablet Take 4 tablets by mouth once daily. 360 tablet 1  . Misc Natural Products (OSTEO BI-FLEX ADV JOINT SHIELD) TABS     . Multiple Vitamin (MULTIVITAMIN WITH MINERALS) TABS tablet Take 1 tablet by mouth daily.    Marland Kitchen omeprazole (PRILOSEC) 20 MG capsule TAKE 1 CAPSULE BY MOUTH  DAILY 30 TO 60 MINUTES  BEFORE BREAKFAST 90 capsule 2  . PARoxetine (PAXIL) 40 MG tablet TAKE 1 TABLET(40 MG) BY MOUTH EVERY MORNING 90 tablet 3  . pravastatin (PRAVACHOL) 40 MG tablet Take 1 tablet (40 mg total) by mouth daily. 90 tablet 2  . rOPINIRole (REQUIP) 2 MG tablet Take 2 tablets at bedtime (Patient taking differently: 2 mg. Take 1 tablets at bedtime) 180 tablet 3   No current facility-administered  medications for this visit.     Family History  Problem Relation Age of Onset  . Heart disease Mother   . Hypertension Mother   . Stroke Mother   . Breast cancer Mother   . Diabetes Mellitus II Father   . Cancer Father        CLL  . Hypertension Brother   . Breast cancer Paternal Grandmother   . Breast cancer Maternal Grandmother     Review of Systems  All other systems reviewed and are negative.   Exam:   BP 128/72 (Cuff Size: Large)   Pulse 76   Resp 16   Ht 5' 3.5" (1.613 m)   Wt 244 lb 9.6 oz (110.9 kg)   LMP 08/14/1994 (Within Months)   BMI 42.65 kg/m     General appearance: alert, cooperative and appears stated age Head: normocephalic, without obvious abnormality, atraumatic Neck: no adenopathy, supple, symmetrical, trachea midline and thyroid normal to inspection and palpation Lungs: clear to auscultation bilaterally Breasts: normal appearance, no masses or tenderness, No nipple retraction or dimpling, No nipple discharge or bleeding, No axillary adenopathy Heart: regular rate and rhythm Abdomen: obese, soft, non-tender; no masses, no organomegaly Extremities: extremities normal, atraumatic, no cyanosis or edema Skin: skin color, texture, turgor normal. No rashes or lesions. Monitor taped to skin.  Lymph nodes: cervical, supraclavicular, and axillary nodes normal. Neurologic: grossly normal  Pelvic: External genitalia:  Right lateral vulva with 1 cm small nontender fluctuant skin with erythema.  Not draining.               No abnormal inguinal nodes palpated.              Urethra:  normal appearing urethra with no masses, tenderness or lesions              Bartholins and Skenes: normal                 Vagina: normal appearing vagina with normal color and discharge, no lesions              Cervix:  absent              Pap taken: No. Bimanual Exam:  Uterus:   Absent.  Exam limited by [facility].               Adnexa: no mass, fullness, tenderness              Rectal  exam: Yes.  .  Confirms.              Anus:  normal sphincter tone, no lesions  Chaperone was present for exam.  Assessment:   Well woman visit with normal exam. FH breast cancer.  Increased risk of breast cancer.  FH of CHEK2.  Mixed incontinence.  PCP managing.  Hx HSV, probably type 1.  DM.  Vulvar abscess.  Small.   Plan: Mammogram screening discussed. Self breast awareness reviewed. Pap and HR HPV as above. Guidelines for Calcium, Vitamin D, regular exercise program including cardiovascular and weight bearing exercise. Referral to genetics counselor for testing.  We discussed possible breast MRI also.  Flu vaccine recommended.  BMD ordered.  Doxycycline 100 mg po bid x 7 days. Return of abscess does not resolve.  Follow up annually and prn.   After visit summary provided.

## 2019-04-07 ENCOUNTER — Encounter: Payer: Self-pay | Admitting: Obstetrics and Gynecology

## 2019-04-07 ENCOUNTER — Other Ambulatory Visit: Payer: Self-pay

## 2019-04-07 ENCOUNTER — Ambulatory Visit: Payer: 59 | Admitting: Obstetrics and Gynecology

## 2019-04-07 VITALS — BP 128/72 | HR 76 | Resp 16 | Ht 63.5 in | Wt 244.6 lb

## 2019-04-07 DIAGNOSIS — N764 Abscess of vulva: Secondary | ICD-10-CM | POA: Diagnosis not present

## 2019-04-07 DIAGNOSIS — Z01419 Encounter for gynecological examination (general) (routine) without abnormal findings: Secondary | ICD-10-CM

## 2019-04-07 DIAGNOSIS — Z9189 Other specified personal risk factors, not elsewhere classified: Secondary | ICD-10-CM | POA: Diagnosis not present

## 2019-04-07 DIAGNOSIS — Z780 Asymptomatic menopausal state: Secondary | ICD-10-CM | POA: Diagnosis not present

## 2019-04-07 MED ORDER — DOXYCYCLINE HYCLATE 100 MG PO CAPS: 100.0000 mg | ORAL_CAPSULE | Freq: Two times a day (BID) | ORAL | 0 refills | Status: DC

## 2019-04-07 NOTE — Patient Instructions (Signed)

## 2019-04-10 ENCOUNTER — Encounter: Payer: Self-pay | Admitting: Obstetrics and Gynecology

## 2019-04-11 ENCOUNTER — Telehealth: Payer: Self-pay | Admitting: Obstetrics and Gynecology

## 2019-04-11 NOTE — Telephone Encounter (Signed)
Patient sent the following message through [place]. Routing to triage to assist patient with request.  I have been taking the meds you prescribed on Monday, but now I have developed a yeast infection, which I understand is a side effect. Are OTC medications what is prescribed these days or is there a Rx that you prefer I take for this problem.   [employer]  July 07, 1956  [phone]

## 2019-04-11 NOTE — Telephone Encounter (Signed)
Spoke with patient. Seen in office on 04/07/19, tx with doxycycline for vulvar abscess. Feels "lousy" on abx, area may be a little smaller, but not resolved yet, has a couple of days left of abx. Reports vaginal itching, no vaginal d/c or odor, is concerned she may be developing yeast infection. Discussed options for OTC monistat, patient will try this and return call if symptoms do not resolve or new symptoms develop. Advised patient to complete abx for vulvar abscess, if symptoms have not resolved will need to return call to office. Advised Dr. Quincy Simmonds will review, our office will return call if any additional recommendations.   Routing to provider for final review. Patient is agreeable to disposition. Will close encounter.

## 2019-04-15 ENCOUNTER — Telehealth: Payer: Self-pay | Admitting: Genetic Counselor

## 2019-04-15 NOTE — Telephone Encounter (Signed)
Received a genetic counseling referral from Dr. Quincy Simmonds for an increased risk for breast cancer. Diane Gregory has been cld and scheduled to see Raquel Sarna on 9/3 at 1pm. She's been made been aware to arrive 15 minutes early.

## 2019-04-16 ENCOUNTER — Telehealth: Payer: Self-pay | Admitting: Genetic Counselor

## 2019-04-16 NOTE — Telephone Encounter (Signed)
Called to see if Diane Gregory is able to bring a copy of her niece's genetic testing report. She will try.

## 2019-04-17 ENCOUNTER — Other Ambulatory Visit: Payer: Self-pay | Admitting: Internal Medicine

## 2019-04-17 ENCOUNTER — Encounter: Payer: Self-pay | Admitting: Genetic Counselor

## 2019-04-17 ENCOUNTER — Other Ambulatory Visit: Payer: Self-pay

## 2019-04-17 ENCOUNTER — Inpatient Hospital Stay: Payer: 59

## 2019-04-17 ENCOUNTER — Inpatient Hospital Stay: Payer: 59 | Attending: Genetic Counselor | Admitting: Genetic Counselor

## 2019-04-17 DIAGNOSIS — Z808 Family history of malignant neoplasm of other organs or systems: Secondary | ICD-10-CM | POA: Diagnosis not present

## 2019-04-17 DIAGNOSIS — Z803 Family history of malignant neoplasm of breast: Secondary | ICD-10-CM | POA: Insufficient documentation

## 2019-04-17 DIAGNOSIS — Z806 Family history of leukemia: Secondary | ICD-10-CM | POA: Insufficient documentation

## 2019-04-17 DIAGNOSIS — Z8481 Family history of carrier of genetic disease: Secondary | ICD-10-CM

## 2019-04-17 DIAGNOSIS — Z8043 Family history of malignant neoplasm of testis: Secondary | ICD-10-CM | POA: Insufficient documentation

## 2019-04-17 NOTE — Progress Notes (Signed)
REFERRING PROVIDER: Nunzio Cobbs, MD [address] [place],  [place] [address]  PRIMARY PROVIDER:  Biagio Borg, MD  PRIMARY REASON FOR VISIT:  1. Family history of carrier of hereditary disease   2. Family history of breast cancer   3. Family history of CLL (chronic lymphoid leukemia)   4. Family history of bone cancer   5. Family history of testicular cancer      HISTORY OF PRESENT ILLNESS:   Diane Gregory, a 63 y.o. female, was seen for a [facility] cancer genetics consultation at the request of Dr. Yisroel Ramming due to a family history of a CHEK2 mutation and breast, bone, testicular cancer and leukemia.  Diane Gregory presents to clinic today to discuss the possibility of a hereditary predisposition to cancer, genetic testing, and to further clarify her future cancer risks, as well as potential cancer risks for family members.    Diane Gregory is a 63 y.o. female with no personal history of cancer.    CANCER HISTORY:  Oncology History   No history exists.     RISK FACTORS:  Menarche was at age 51.  First live birth at age 43.  OCP use for approximately 18-20 years.  Ovaries intact: yes.  Hysterectomy: yes.  Menopausal status: postmenopausal.  HRT use: 0 years. Colonoscopy: yes; normal. Mammogram within the last year: yes. Number of breast biopsies: 1 - benign.  Past Medical History:  Diagnosis Date  . Anxiety   . Arthritis    "was in my knees before OR; now in my hands" (05/05/2014)  . Bronchitis   . Chronic bronchitis ([place])    "used to get it q yr; not as much since septal repair in 2004"  . Depression   . Family history of bone cancer   . Family history of breast cancer   . Family history of carrier of hereditary disease   . Family history of CLL (chronic lymphoid leukemia)   . Family history of testicular cancer   . GERD (gastroesophageal reflux disease)   . High triglycerides   . HSV infection   . Hyperlipidemia   . Hypertension    . Left knee DJD   . Obesity   . Primary localized osteoarthritis of right knee   . Restless leg syndrome   . Sleep apnea    mild; does not wear CPAP  . Type II diabetes mellitus ([place])   . Urgency of urination    and frequency takes Vesicare  . Urinary incontinence   . Walking pneumonia ~ 2013    Past Surgical History:  Procedure Laterality Date  . COLONOSCOPY    . HYSTEROSCOPY  11/94   submuous myomas  . KNEE ARTHROSCOPY Right ~ 2010  . KNEE ARTHROSCOPY W/ MENISCECTOMY Right 2010  . NASAL SEPTUM SURGERY    . REFRACTIVE SURGERY Bilateral ~ 2005  . TONSILLECTOMY  ~ 2004  . TOTAL KNEE ARTHROPLASTY Left 08/11/2013   Procedure: LEFT TOTAL KNEE ARTHROPLASTY;  Surgeon: Lorn Junes, MD;  Location: [facility];  Service: Orthopedics;  Laterality: Left;  . TOTAL KNEE ARTHROPLASTY Right 05/04/2014   Procedure: RIGHT TOTAL KNEE ARTHROPLASTY;  Surgeon: Lorn Junes, MD;  Location: [facility];  Service: Orthopedics;  Laterality: Right;  . UVULOPALATOPHARYNGOPLASTY, TONSILLECTOMY AND SEPTOPLASTY  2004  . VAGINAL HYSTERECTOMY  06/1993    Social History   Socioeconomic History  . Marital status: Widowed    Spouse name: Not on file  .  Number of children: 2  . Years of education: 26  . Highest education level: Not on file  Occupational History  . Occupation: Intake Coordinator    Comment: administration  Social Needs  . Financial resource strain: Not on file  . Food insecurity    Worry: Not on file    Inability: Not on file  . Transportation needs    Medical: No    Non-medical: No  Tobacco Use  . Smoking status: Never Smoker  . Smokeless tobacco: Never Used  Substance and Sexual Activity  . Alcohol use: Not Currently  . Drug use: No  . Sexual activity: Not Currently    Partners: Male    Birth control/protection: Surgical    Comment: TVH 1994  Lifestyle  . Physical activity    Days per week: Not on file    Minutes per session: Not on file  . Stress: Not on file  Relationships   . Social Herbalist on phone: Not on file    Gets together: Not on file    Attends religious service: Not on file    Active member of club or organization: Not on file    Attends meetings of clubs or organizations: Not on file    Relationship status: Not on file  Other Topics Concern  . Not on file  Social History Narrative   Currently single. Fun: Crafts, flea markets, antiquing, travel, reading   Denies religious beliefs that effect health care.    Caffeine: 2-3 drinks a day      FAMILY HISTORY:  We obtained a detailed, 4-generation family history.  Significant diagnoses are listed below: Family History  Problem Relation Age of Onset  . Heart disease Mother   . Hypertension Mother   . Stroke Mother   . Breast cancer Mother 5  . Diabetes Mellitus II Father   . Cancer Father        CLL  . Hypertension Brother   . Testicular cancer Brother 5  . Breast cancer Paternal Grandmother        diagnosed 9s  . Breast cancer Maternal Grandmother        diagnosed 98s  . Breast cancer Paternal Aunt   . Bone cancer Maternal Grandfather 70  . Breast cancer Cousin        paternal first cousin, diagnosed 57s     Diane Gregory has two daughters ages 61 and 53. She has one brother who had testicular cancer when he was 32-25, and two nieces and one nephew. One of her nieces had genetic testing due to her mother's history of cancer, and the test was positive for a mutation in the CHEK2 gene.   Diane Gregory mother is currently 75 and had breast cancer when she was 62. She does not have any maternal aunts or uncles. Her maternal grandmother died at the age of 37 and had breast cancer diagnosed in her 33s. Diane Gregory grandfather died at the age of 83 and had bone cancer diagnosed when he was 32. Diane Gregory notes that her grandfather had significant radiation exposure through his occupation.  Diane Gregory father died from chronic lymphatic leukemia when he was 71, although it had been  diagnosed many years prior. She had two paternal aunts. One paternal aunt died in her 69s and had breast cancer at an unknown age. This aunt also had a daughter (Diane Gregory's cousin) who was diagnosed with breast cancer when she was in her 13s. Her  paternal grandmother also had breast cancer in her 66s.  Ms. Cech is aware of previous family history of genetic testing for hereditary cancer risks. Patient's maternal ancestors are of [place] descent, and paternal ancestors are of Anglo-Saxon/English descent. There is no reported Ashkenazi Jewish ancestry. There is no known consanguinity.  GENETIC COUNSELING ASSESSMENT: Diane Gregory is a 63 y.o. female with a family history of breast cancer and positive genetic testing, which is somewhat suggestive of a hereditary cancer syndrome and predisposition to cancer. We, therefore, discussed and recommended the following at today's visit.   DISCUSSION: One of Diane Gregory's nieces had genetic testing that was positive for a mutation in the CHEK2 gene. These results were not available for review at today's appointment. Diane Gregory sister-in-law then had genetic testing that was negative, meaning that the CHEK2 mutation was likely inherited from Diane Gregory's brother. If her brother is a carrier of the CHEK2 mutation, this means that Diane Gregory has a 50% (1 in 2) chance to also have the CHEK2 mutation.   We reviewed the cancer risks that are associated with CHEK2 mutations, including an increased risk of breast, colon, and prostate cancers.Theseestimated cancer risks vary widely and may be influenced by family history. Women with a CHEK2 deleterious mutation have approximately a 28% to 37% lifetime risk of breast cancer. Individuals with CHEK2 mutations may opt for increased cancer screening for associated cancer risks per the NCCN guidelines.  There are other genes that are known to increase the risk for breast cancer. These genes include BRCA1/2, ATM, PALB2, etc. We discussed  that testing is beneficial for several reasons including knowing about other cancer risks, identifying potential screening and risk-reduction options that may be appropriate, and to understand if other family members could be at risk for cancer and allow them to undergo genetic testing. Diane Gregory stated that she would not consider a prophylactic mastectomy in the event of a positive result, but she would follow additional screening recommendations that might be made.  We reviewed the characteristics, features and inheritance patterns of hereditary cancer syndromes. We also discussed genetic testing, including the appropriate family members to test, the process of testing, insurance coverage and turn-around-time for results. We discussed the implications of a negative, positive and/or variant of uncertain significant result. We recommended Diane Gregory pursue genetic testing for the [facility] CancerNext+RNAinsight gene panel.   The CancerNext gene panel offered by [facility] includes sequencing and rearrangement analysis for the following 36 genes:   APC*, ATM*, AXIN2, BARD1, BMPR1A, BRCA1*, BRCA2*, BRIP1*, CDH1*, CDK4, CDKN2A, CHEK2*, DICER1, HOXB13, EPCAM, GREM1, MLH1*, MSH2*, MSH3, MSH6*, MUTYH*, NBN, NF1*, NTHL1, PALB2*, PMS2*, POLD1, POLE, PTEN*, RAD51C*, RAD51D*, RECQL, SMAD4, SMARCA4, STK11, and TP53*. DNA and RNA analyses performed for * genes.    Based on Diane Gregory's family history of cancer and positive genetic testing, she meets medical criteria for genetic testing. Despite that she meets criteria, she may still have an out of pocket cost. We discussed that if her out of pocket cost for testing is over $100, the laboratory will call and confirm whether she wants to proceed with testing.  If the out of pocket cost of testing is less than $100 she will be billed by the genetic testing laboratory.   PLAN: After considering the risks, benefits, and limitations, Ms. Larouche provided informed consent to  pursue genetic testing and the blood sample was sent to [facility] for analysis of the CancerNext+RNAinsight panel. Results should be available within approximately two-three weeks'  time, at which point they will be disclosed by telephone to Ms. Brodhead, as will any additional recommendations warranted by these results. Ms. Buelna will receive a summary of her genetic counseling visit and a copy of her results once available. This information will also be available in Epic.   Ms. Behunin questions were answered to her satisfaction today. Our contact information was provided should additional questions or concerns arise. Thank you for the referral and allowing [place] to share in the care of your patient.   Clint Guy, MS, [facility] Certified Genetic Counselor [email or web address] Phone: [phone]  The patient was seen for a total of 45 minutes in face-to-face genetic counseling.  This patient was discussed with Drs. Magrinat, Lindi Adie and/or Burr Medico who agrees with the above.    _______________________________________________________________________ For Office Staff:  Number of people involved in session: 1 Was an Intern/ student involved with case: no

## 2019-04-18 ENCOUNTER — Other Ambulatory Visit: Payer: Self-pay | Admitting: Family

## 2019-04-18 DIAGNOSIS — E782 Mixed hyperlipidemia: Secondary | ICD-10-CM

## 2019-04-18 DIAGNOSIS — G2581 Restless legs syndrome: Secondary | ICD-10-CM

## 2019-04-18 DIAGNOSIS — E1165 Type 2 diabetes mellitus with hyperglycemia: Secondary | ICD-10-CM

## 2019-04-18 MED ORDER — PRAVASTATIN SODIUM 40 MG PO TABS: 40.0000 mg | ORAL_TABLET | Freq: Every day | ORAL | 0 refills | Status: DC

## 2019-04-18 MED ORDER — ROPINIROLE HCL 2 MG PO TABS: ORAL_TABLET | ORAL | 0 refills | Status: DC

## 2019-04-21 ENCOUNTER — Encounter: Payer: Self-pay | Admitting: Obstetrics and Gynecology

## 2019-04-22 ENCOUNTER — Ambulatory Visit: Payer: 59 | Admitting: Obstetrics and Gynecology

## 2019-04-22 ENCOUNTER — Other Ambulatory Visit: Payer: Self-pay

## 2019-04-22 ENCOUNTER — Encounter: Payer: Self-pay | Admitting: Obstetrics and Gynecology

## 2019-04-22 ENCOUNTER — Encounter: Payer: Self-pay | Admitting: Internal Medicine

## 2019-04-22 ENCOUNTER — Telehealth: Payer: Self-pay | Admitting: Obstetrics and Gynecology

## 2019-04-22 VITALS — BP 130/68 | HR 76 | Temp 97.3°F | Ht 63.5 in | Wt 245.4 lb

## 2019-04-22 DIAGNOSIS — N764 Abscess of vulva: Secondary | ICD-10-CM | POA: Diagnosis not present

## 2019-04-22 MED ORDER — CEPHALEXIN 500 MG PO CAPS: 500.0000 mg | ORAL_CAPSULE | Freq: Two times a day (BID) | ORAL | 0 refills | Status: DC

## 2019-04-22 NOTE — Telephone Encounter (Signed)
Spoke with patient. Was seen and tx for vulvar abscess on 8/24. Completed doxycycline 100 mg bid x7 days. Reports no change in abscess on vulva. Has been taking hot baths and applying warm compresses. Denies drainage, fever/chills or pain. Recommended OV for further evaluation, Covid19 prescreen negative, precautions reviewed. OV scheduled for today at 11am with Dr. Quincy Simmonds.   Patient verbalizes understanding and is agreeable.   Encounter closed.

## 2019-04-22 NOTE — Telephone Encounter (Signed)
[facility]  Phone Number: [phone]        When I was in a couple weeks ago, you prescribed an antibiotic for an infection, an abscess. I took it for a week, but it didn't change anything about the problem. Meanwhile on the drug I felt much like I had the flu. I did not get a yeast infection as I first thought. I have waited another week to see if the abscess changed any, but it has not. I have taken a couple hot baths and put hot compresses on it, but still no changes.  I will be leaving in 2 weeks for a month long driving trip across country and am concerned should it get worse. Right now, it does not bother me, although it is in the crease of my leg and groin. Should it be lanced or left alone? I don't believe I want to go through another round of the same antibiotics. I did not feel normal for 10 days once I started on them.   [employer]  Jun 20, 1956  [phone]

## 2019-04-22 NOTE — Progress Notes (Signed)
GYNECOLOGY  VISIT   HPI: 63 y.o.   Widowed  Caucasian  female   G2P2002 with Patient's last menstrual period was 08/14/1994 (within months).   here for right vulvar abscess.    Right vulva with 1 cm area treated with completed the Doxycycline, one week Rx, for suspected abscess.   It is uncomfortable with cleansing.   No drainage.   Trying hot compresses.   Patient driving to [place] in 2 weeks to see her daughters.  GYNECOLOGIC HISTORY: Patient's last menstrual period was 08/14/1994 (within months). Contraception: Hysterectomy Menopausal hormone therapy: none Last mammogram:  02-20-19 3D Neg/density A/BiRads1  Last pap smear: 2000 Neg        OB History    Gravida  2   Para  2   Term  2   Preterm      AB      Living  2     SAB      TAB      Ectopic      Multiple      Live Births                 Patient Active Problem List   Diagnosis Date Noted  . Family history of breast cancer   . Family history of CLL (chronic lymphoid leukemia)   . Family history of bone cancer   . Family history of testicular cancer   . Family history of carrier of hereditary disease   . Achilles tendon pain 03/18/2019  . Type 2 diabetes mellitus with hyperglycemia, without long-term current use of insulin ([place]) 08/02/2018  . At high risk for breast cancer 01/11/2018  . Encounter for general adult medical examination with abnormal findings 06/22/2017  . History of anemia 06/22/2017  . De Quervain's tenosynovitis, right 03/01/2016  . Overactive bladder 01/24/2016  . Severe obesity (BMI >= 40) ([place]) 07/05/2015  . Anxiety and depression 11/18/2014  . S/P left total knee replacement   . Right knee DJD   . Primary localized osteoarthritis of right knee   . DJD (degenerative joint disease) of knee 08/11/2013  . Diabetes mellitus, type 2 ([place])   . Hypertension   . Restless leg syndrome   . GERD (gastroesophageal reflux disease)   . High triglycerides   . Hyperlipidemia   .  Sleep apnea   . Left knee DJD     Past Medical History:  Diagnosis Date  . Anxiety   . Arthritis    "was in my knees before OR; now in my hands" (05/05/2014)  . Bronchitis   . Chronic bronchitis ([place])    "used to get it q yr; not as much since septal repair in 2004"  . Depression   . Family history of bone cancer   . Family history of breast cancer   . Family history of carrier of hereditary disease   . Family history of CLL (chronic lymphoid leukemia)   . Family history of testicular cancer   . GERD (gastroesophageal reflux disease)   . High triglycerides   . HSV infection   . Hyperlipidemia   . Hypertension   . Left knee DJD   . Obesity   . Primary localized osteoarthritis of right knee   . Restless leg syndrome   . Sleep apnea    mild; does not wear CPAP  . Type II diabetes mellitus ([place])   . Urgency of urination    and frequency takes Vesicare  . Urinary incontinence   .  Walking pneumonia ~ 2013    Past Surgical History:  Procedure Laterality Date  . COLONOSCOPY    . HYSTEROSCOPY  11/94   submuous myomas  . KNEE ARTHROSCOPY Right ~ 2010  . KNEE ARTHROSCOPY W/ MENISCECTOMY Right 2010  . NASAL SEPTUM SURGERY    . REFRACTIVE SURGERY Bilateral ~ 2005  . TONSILLECTOMY  ~ 2004  . TOTAL KNEE ARTHROPLASTY Left 08/11/2013   Procedure: LEFT TOTAL KNEE ARTHROPLASTY;  Surgeon: Lorn Junes, MD;  Location: [facility];  Service: Orthopedics;  Laterality: Left;  . TOTAL KNEE ARTHROPLASTY Right 05/04/2014   Procedure: RIGHT TOTAL KNEE ARTHROPLASTY;  Surgeon: Lorn Junes, MD;  Location: [facility];  Service: Orthopedics;  Laterality: Right;  . UVULOPALATOPHARYNGOPLASTY, TONSILLECTOMY AND SEPTOPLASTY  2004  . VAGINAL HYSTERECTOMY  06/1993    Current Outpatient Medications  Medication Sig Dispense Refill  . Ascorbic Acid (VITAMIN C PO) Take by mouth daily.    Marland Kitchen diltiazem (CARDIZEM CD) 240 MG 24 hr capsule Take 1 capsule (240 mg total) by mouth daily. 90 capsule 3  . Exenatide ER  (BYDUREON) 2 MG PEN INJECT 2 MG AS DIRECTED ONCE A WEEK. NEED TO ESTABLISH WITH NEW PROVIDER. 4 each 2  . fenofibrate 160 MG tablet TAKE 1 TABLET BY MOUTH  DAILY 90 tablet 3  . fesoterodine (TOVIAZ) 8 MG TB24 tablet Take 1 tablet (8 mg total) by mouth daily. 90 tablet 3  . glipiZIDE (GLUCOTROL XL) 5 MG 24 hr tablet Take 1 tablet by mouth 2 (two) times daily.    Marland Kitchen glipiZIDE (GLUCOTROL) 5 MG tablet Take 1 tablet (5 mg total) by mouth 2 (two) times daily with a meal. 180 tablet 3  . losartan-hydrochlorothiazide (HYZAAR) 100-25 MG tablet Take 1 tablet by mouth daily. 90 tablet 3  . Melatonin 1 MG TABS Take 1 mg by mouth at bedtime.    . metFORMIN (GLUCOPHAGE-XR) 500 MG 24 hr tablet TAKE 4 TABLETS BY MOUTH  ONCE DAILY 360 tablet 3  . Misc Natural Products (OSTEO BI-FLEX ADV JOINT SHIELD) TABS     . Multiple Vitamin (MULTIVITAMIN WITH MINERALS) TABS tablet Take 1 tablet by mouth daily.    Marland Kitchen omeprazole (PRILOSEC) 20 MG capsule TAKE 1 CAPSULE BY MOUTH  DAILY 30 TO 60 MINUTES  BEFORE BREAKFAST 90 capsule 2  . PARoxetine (PAXIL) 40 MG tablet TAKE 1 TABLET(40 MG) BY MOUTH EVERY MORNING 90 tablet 3  . pravastatin (PRAVACHOL) 40 MG tablet Take 1 tablet (40 mg total) by mouth daily. 90 tablet 0  . rOPINIRole (REQUIP) 2 MG tablet Take 2 tablets at bedtime 180 tablet 0   No current facility-administered medications for this visit.      ALLERGIES: Celebrex [celecoxib], Lisinopril, Meloxicam, Nsaids, Penicillins, Statins, and Lidocaine  Family History  Problem Relation Age of Onset  . Heart disease Mother   . Hypertension Mother   . Stroke Mother   . Breast cancer Mother 12  . Diabetes Mellitus II Father   . Cancer Father        CLL  . Hypertension Brother   . Testicular cancer Brother 83  . Breast cancer Paternal Grandmother        diagnosed 10s  . Breast cancer Maternal Grandmother        diagnosed 71s  . Breast cancer Paternal Aunt   . Bone cancer Maternal Grandfather 70  . Breast cancer Cousin         paternal first cousin, diagnosed 78s    Social  History   Socioeconomic History  . Marital status: Widowed    Spouse name: Not on file  . Number of children: 2  . Years of education: 8  . Highest education level: Not on file  Occupational History  . Occupation: Intake Coordinator    Comment: administration  Social Needs  . Financial resource strain: Not on file  . Food insecurity    Worry: Not on file    Inability: Not on file  . Transportation needs    Medical: No    Non-medical: No  Tobacco Use  . Smoking status: Never Smoker  . Smokeless tobacco: Never Used  Substance and Sexual Activity  . Alcohol use: Not Currently  . Drug use: No  . Sexual activity: Not Currently    Partners: Male    Birth control/protection: Surgical    Comment: TVH 1994  Lifestyle  . Physical activity    Days per week: Not on file    Minutes per session: Not on file  . Stress: Not on file  Relationships  . Social Herbalist on phone: Not on file    Gets together: Not on file    Attends religious service: Not on file    Active member of club or organization: Not on file    Attends meetings of clubs or organizations: Not on file    Relationship status: Not on file  . Intimate partner violence    Fear of current or ex partner: Not on file    Emotionally abused: Not on file    Physically abused: Not on file    Forced sexual activity: Not on file  Other Topics Concern  . Not on file  Social History Narrative   Currently single. Fun: Crafts, flea markets, antiquing, travel, reading   Denies religious beliefs that effect health care.    Caffeine: 2-3 drinks a day     Review of Systems  All other systems reviewed and are negative.   PHYSICAL EXAMINATION:    BP 130/68 (Cuff Size: Large)   Pulse 76   Temp (!) 97.3 F (36.3 C) (Temporal)   Ht 5' 3.5" (1.613 m)   Wt 245 lb 6.4 oz (111.3 kg)   LMP 08/14/1994 (Within Months)   BMI 42.79 kg/m     General appearance:  alert, cooperative and appears stated age Head: Normocephalic, without obvious abnormality, atraumatic    Pelvic: External genitalia:  Right lateral vulva - 1 cm raised fluctuant, slightly sore, red skin change.  Abscess.   Procedure Verbal consent for incision and drainage of abscess.  Sterile prep with Hibiclens.  Local 1% lidocaine - lot [id number], exp April 2022.  Scalpel used to open skin.  Pus and blood noted and drained.  Still with firmness under the skin.  Wound culture taken.  Dressing placed.  No complications.               Chaperone was present for exam.  ASSESSMENT  Right vulvar abscess.  Infected sebaceous cyst?  PLAN  FU wound culture.  Rx for Keflex 500 mg po bid x 7 days.  I discussed cross reactivity potential with PCN allergy.  OK to wash and bathe the area tomorrow.  Return for increased symptoms.    An After Visit Summary was printed and given to the patient.  ___15___ minutes face to face time of which over 50% was spent in counseling.

## 2019-04-25 ENCOUNTER — Other Ambulatory Visit: Payer: Self-pay | Admitting: Internal Medicine

## 2019-04-25 LAB — WOUND CULTURE: Organism ID, Bacteria: NONE SEEN

## 2019-04-25 NOTE — Telephone Encounter (Signed)
Diane Gregory pt 

## 2019-04-26 ENCOUNTER — Ambulatory Visit (INDEPENDENT_AMBULATORY_CARE_PROVIDER_SITE_OTHER): Payer: 59

## 2019-04-26 ENCOUNTER — Other Ambulatory Visit: Payer: Self-pay

## 2019-04-26 DIAGNOSIS — Z23 Encounter for immunization: Secondary | ICD-10-CM

## 2019-04-29 ENCOUNTER — Encounter: Payer: Self-pay | Admitting: Adult Health

## 2019-04-29 DIAGNOSIS — Z1379 Encounter for other screening for genetic and chromosomal anomalies: Secondary | ICD-10-CM | POA: Insufficient documentation

## 2019-05-01 ENCOUNTER — Ambulatory Visit: Payer: Self-pay | Admitting: Genetic Counselor

## 2019-05-01 ENCOUNTER — Encounter: Payer: Self-pay | Admitting: Genetic Counselor

## 2019-05-01 ENCOUNTER — Telehealth: Payer: Self-pay | Admitting: Genetic Counselor

## 2019-05-01 DIAGNOSIS — Z1379 Encounter for other screening for genetic and chromosomal anomalies: Secondary | ICD-10-CM

## 2019-05-01 NOTE — Telephone Encounter (Signed)
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 This is a moderate risk mutation that is associated with an increased risk for breast, colon, and prostate cancer. We discussed management options for these risks and how it relates to her family history of cancer.

## 2019-05-01 NOTE — Progress Notes (Signed)
GENETIC TEST RESULTS   Patient Name: SORAH FALKENSTEIN Patient Age: 63 y.o. Encounter Date: 05/01/2019  REFERRING PROVIDER: Nunzio Cobbs, [address] [place],  [place] [address]   Ms. Alper was seen in the [facility] due to a family history of a CHEK2 mutation and breast cancer, and concern regarding a hereditary predisposition to cancer in the family. Please refer to the prior Genetics clinic note for more information regarding Ms. Gibeau's medical and family histories and our assessment at the time.   FAMILY HISTORY:  We obtained a detailed, 4-generation family history.  Significant diagnoses are listed below: Family History  Problem Relation Age of Onset   Heart disease Mother    Hypertension Mother    Stroke Mother    Breast cancer Mother 6   Diabetes Mellitus II Father    Cancer Father        CLL   Hypertension Brother    Testicular cancer Brother 54   Breast cancer Paternal Grandmother        diagnosed 38s   Breast cancer Maternal Grandmother        diagnosed 38s   Breast cancer Paternal Aunt    Bone cancer Maternal Grandfather 20   Breast cancer Cousin        paternal first cousin, diagnosed 80s     Ms. Eilers has two daughters ages 54 and 57. She has one brother who had testicular cancer when he was 83-25, and two nieces and one nephew. One of her nieces had genetic testing due to her mother's history of cancer, and the test was positive for a mutation in the CHEK2 gene.   Ms. Merriman mother is currently 34 and had breast cancer when she was 44. She does not have any maternal aunts or uncles. Her maternal grandmother died at the age of 62 and had breast cancer diagnosed in her 37s. Ms. Lheureux grandfather died at the age of 19 and had bone cancer diagnosed when he was 6. Ms. Hosie notes that her grandfather had significant radiation exposure through his occupation.  Ms. Grode father died from chronic lymphatic  leukemia when he was 61, although it had been diagnosed many years prior. She had two paternal aunts. One paternal aunt died in her 48s and had breast cancer at an unknown age. This aunt also had a daughter (Ms. Gorelick's cousin) who was diagnosed with breast cancer when she was in her 74s. Her paternal grandmother also had breast cancer in her 43s.  Ms. Frankowski is aware of previous family history of genetic testing for hereditary cancer risks. Patient's maternal ancestors are of [place] descent, and paternal ancestors are of Anglo-Saxon/English descent. There is no reported Ashkenazi Jewish ancestry. There is no known consanguinity.  GENETIC TESTING:  At the time of Ms. Bink's visit, we recommended she pursue genetic testing of the CancerNext+RNAinsight test offered by [employer]. The genetic testing reported out on 04/29/2019 and identified a single, heterozygous pathogenic gene mutation in the CHEK2 gene, called c.470T>C (p.I157T). There were no deleterious mutations in: APC, ATM, AXIN2, BARD1, BMPR1A, BRCA1, BRCA2, BRIP1, CDH1, CDK4, CDKN2A, DICER1, HOXB13, EPCAM, GREM1, MLH1, MSH2, MSH3, MSH6, MUTYH, NBN, NF1, NTHL1, PALB2, PMS2, POLD1, POLE, PTEN, RAD51C, RAD51D, RECQL, SMAD4, SMARCA4, STK11, or TP53.    DISCUSSION: CHEK2 mutations have been found to be associated with an increased risk of breast and other cancers. The lifetime risk for breast cancer in carriers of the most  common CHEK2 mutation (c.1100delC) is 25-37% (compared to 56% to 68% for BRCA). There is a tendency for these breast cancers to occur at younger ages, and women with mutations have an increased risk for cancer in the opposite breast (about 1% per year or 24.1% after 10 years). One study of the mutation found in this patient (c.470T>C) estimated the lifetime risk for breast cancer among carriers to be increased by about 50% over the general population risk (compared to double the risk for the common mutation). The risk  for contralateral breast cancer has not been well studied in women with the c.470T>C mutation.  CHEK2 mutations are associated with other malignancies as well. Thyroid, bladder, kidney, prostate, colon, and female breast cancer occur more frequently in individuals who carry CHEK2 mutations than in the general population. As with female breast cancer, the rates vary from family to family and more research needs to be done to figure out specific risks. Ovarian and uterine cancers have been reported in [place] families but it is currently unknown whether there is an associated increased ovarian or uterine cancer risk.  CANCER SCREENING: Below are the NCCN Practice guidelines for women and men.    Breast Management Options We reviewed the NCCN practice guidelines (v1.2021) for breast management for women at an increased risk of breast cancer because of CHEK2 mutations:    Annual mammogram with consideration of tomosynthesis and consider annual breast MRI with contrast starting at age 66, or 49 years younger than the earliest age of onset.   Risk reducing mastectomy: evidence insufficient, manage based on family history.  Colon Cancer Management:  Men and women with a deleterious CHEK2 mutation may have up to a 10% lifetime risk for colon cancer. The following are the NCCN practice guidelines (v.1.2020) for individuals at an increased risk of colon cancer because of a CHEK2 mutation:  Personal history of colon cancer  Follow instructions provided by your physician based on your personal history.  Do not have a personal history of colon cancer but have a parent/sibling/child with colon cancer: Colonoscopy every 5 years starting at age 72 or 56 years younger than the earliest age of onset, whichever is younger.  Do not have a personal history of colon cancer but do not have a parent/sibling/child with colon cancer: Colonoscopy every 5 years starting at age 12.   FAMILY MEMBERS: It is important that  all of Ms. Monroy's relatives (both men and women) know of the presence of this gene mutation.  Women need to know that they may be at increased risk for breast and colon cancers.  Men are at slightly increased risk for breast, prostate and colon cancers.  Genetic testing can sort out who in your family is at risk and who is not.  We would be happy to help meet with and coordinate genetic testing for any relative that is interested.  Ms. Deland children are at 50% risk to have inherited the mutation found in her. We recommend they have genetic testing for this same mutation, as identifying the presence of this mutation would allow them to also take advantage of risk-reducing measures.   Our knowledge of cancer risks related to CHEK2 mutations will continue to evolve. We recommended that Ms. Dolloff follow up with the genetics clinic annually so we can provide her with the most current information about CHEK2 and cancer risk, as well as with any changes to her family history (new cancer diagnoses, genetic test results).  SUPPORT AND RESOURCES:  We provided information about two support groups for hereditary cancer syndrome information and support, Facing Our Risk (www.facingourrisk.com) and Bright Pink (www.brightpink.org) which some people have found useful.  They provide opportunities to speak with other individuals from high-risk families.    Our contact number was provided. Ms. Forsee questions were answered to her satisfaction, and she knows she is welcome to call us at anytime with additional questions or concerns.   Clint Guy, MS, [facility] Certified Genetic Counselor [email or web address] Phone: [phone]

## 2019-05-02 ENCOUNTER — Encounter: Payer: Self-pay | Admitting: Genetic Counselor

## 2019-06-12 ENCOUNTER — Other Ambulatory Visit: Payer: Self-pay | Admitting: *Deleted

## 2019-06-12 MED ORDER — TOVIAZ 8 MG PO TB24: 8.0000 mg | ORAL_TABLET | Freq: Every day | ORAL | 0 refills | Status: DC

## 2019-06-12 MED ORDER — FENOFIBRATE 160 MG PO TABS: ORAL_TABLET | ORAL | 0 refills | Status: DC

## 2019-06-17 ENCOUNTER — Other Ambulatory Visit: Payer: Self-pay

## 2019-06-18 ENCOUNTER — Ambulatory Visit
Admission: RE | Admit: 2019-06-18 | Discharge: 2019-06-18 | Disposition: A | Discharge status: Home / Self Care | Payer: 59 | Source: Ambulatory Visit | Attending: Obstetrics and Gynecology | Admitting: Obstetrics and Gynecology

## 2019-06-18 DIAGNOSIS — Z780 Asymptomatic menopausal state: Secondary | ICD-10-CM

## 2019-06-18 NOTE — Progress Notes (Signed)
Name: Diane Gregory  Age/ Sex: 63 y.o., female   MRN/ DOB: [id number], Jan 27, 1956     PCP: Biagio Borg, MD   Reason for Endocrinology Evaluation: Type 2 Diabetes Mellitus  Initial Endocrine Consultative Visit: 08/02/18    PATIENT IDENTIFIER: Diane Gregory is a 63 y.o. female with a past medical history of HTN, OSA, T2DM and DJD . The patient has followed with Endocrinology clinic since 08/02/18 for consultative assistance with management of her diabetes.  DIABETIC HISTORY:  Diane Gregory was diagnosed with T2DM in 2009, Diane Gregory has been on oral glycemic agents since her diagnosis, on her initial presentation to our clinic Diane Gregory was on Bydureon 2 mg weekly, glipizide ER 7-1/2 mg with breakfast, metformin XR 500 mg 2 tablets with breakfast. Her hemoglobin A1c has ranged from 6.5%  In 12/2017 , peaking at 8.3% in 2017  SUBJECTIVE:   During the last visit (12/19/2018): Her A1c was 6.7% .  We continued Glipizide Bydureon 2 mg weekly, and  Metformin.    Today (06/19/2019): Diane Gregory is here for a 3 month follow-up on her diabetes management. Diane Gregory checks her blood sugars before meals and at bedtime daily, through her CGM. The patient has not had hypoglycemic episodes since the last clinic visit. Diane Gregory continues to snack at bedtime sometimes its cereal and at times its cheese. Otherwise, the patient has not required any recent emergency interventions for hypoglycemia and has not had recent hospitalizations secondary to hyper or hypoglycemic episodes.   Today Diane Gregory is c/o exacerbation of left foot plantar fasciitis as well as posterior ankle pain and worsening of her tingling over the past few weeks.   ROS: As per HPI and as detailed below: Review of Systems  Constitutional: Negative for fever and weight loss.  HENT: Negative for congestion and sore throat.   Respiratory: Negative for cough and shortness of breath.   Cardiovascular: Negative for chest pain and palpitations.  Gastrointestinal: Negative  for diarrhea and nausea.  Genitourinary: Negative for frequency.  Endo/Heme/Allergies: Negative for polydipsia.      HOME DIABETES REGIMEN:   Glipizide 5 mg BID  Metformin XR 500 mg Two tabs BID  Bydureon at 2 mg weekly      CONTINUOUS GLUCOSE MONITORING RECORD INTERPRETATION    Dates of Recording: 10/23-11/12/2018  Sensor description: Dexcom   Results statistics:   CGM use % of time 43  Average and SD 160/44  Time in range   72 %  % Time Above 180 24  % Time above 250 3  % Time Below target 0    Glycemic patterns summary: Diane Gregory was noted with hyperglycemia in post breakfast.   Hypoglycemic episodes occurred none  Overnight periods: Within goal       HISTORY:  Past Medical History:  Past Medical History:  Diagnosis Date  . Anxiety   . Arthritis    "was in my knees before OR; now in my hands" (05/05/2014)  . Bronchitis   . Chronic bronchitis ([place])    "used to get it q yr; not as much since septal repair in 2004"  . Depression   . Family history of bone cancer   . Family history of breast cancer   . Family history of carrier of hereditary disease   . Family history of CLL (chronic lymphoid leukemia)   . Family history of testicular cancer   . GERD (gastroesophageal reflux disease)   . High triglycerides   .  HSV infection   . Hyperlipidemia   . Hypertension   . Left knee DJD   . Obesity   . Primary localized osteoarthritis of right knee   . Restless leg syndrome   . Sleep apnea    mild; does not wear CPAP  . Type II diabetes mellitus ([place])   . Urgency of urination    and frequency takes Vesicare  . Urinary incontinence   . Walking pneumonia ~ 2013   Past Surgical History:  Past Surgical History:  Procedure Laterality Date  . COLONOSCOPY    . HYSTEROSCOPY  11/94   submuous myomas  . KNEE ARTHROSCOPY Right ~ 2010  . KNEE ARTHROSCOPY W/ MENISCECTOMY Right 2010  . NASAL SEPTUM SURGERY    . REFRACTIVE SURGERY Bilateral ~ 2005  . TONSILLECTOMY   ~ 2004  . TOTAL KNEE ARTHROPLASTY Left 08/11/2013   Procedure: LEFT TOTAL KNEE ARTHROPLASTY;  Surgeon: Lorn Junes, MD;  Location: [facility];  Service: Orthopedics;  Laterality: Left;  . TOTAL KNEE ARTHROPLASTY Right 05/04/2014   Procedure: RIGHT TOTAL KNEE ARTHROPLASTY;  Surgeon: Lorn Junes, MD;  Location: [facility];  Service: Orthopedics;  Laterality: Right;  . UVULOPALATOPHARYNGOPLASTY, TONSILLECTOMY AND SEPTOPLASTY  2004  . VAGINAL HYSTERECTOMY  06/1993    Social History:  reports that Diane Gregory has never smoked. Diane Gregory has never used smokeless tobacco. Diane Gregory reports previous alcohol use. Diane Gregory reports that Diane Gregory does not use drugs. Family History:  Family History  Problem Relation Age of Onset  . Heart disease Mother   . Hypertension Mother   . Stroke Mother   . Breast cancer Mother 88  . Diabetes Mellitus II Father   . Cancer Father        CLL  . Hypertension Brother   . Testicular cancer Brother 40  . Breast cancer Paternal Grandmother        diagnosed 55s  . Breast cancer Maternal Grandmother        diagnosed 79s  . Breast cancer Paternal Aunt   . Bone cancer Maternal Grandfather 70  . Breast cancer Cousin        paternal first cousin, diagnosed 73s     HOME MEDICATIONS: Allergies as of 06/19/2019      Reactions   Celebrex [celecoxib] Swelling   Lisinopril Cough   Meloxicam Swelling   Nsaids Swelling   Penicillins Other (See Comments)   Drop in blood pressure   Statins Other (See Comments)   Myalgias, edema Other reaction(s): Other (See Comments) edema Other reaction(s): Other (See Comments) Myalgias, edema   Lidocaine Rash   "bad taste in mouth" with dental work      Medication List       Accurate as of June 19, 2019  1:12 PM. If you have any questions, ask your nurse or doctor.        STOP taking these medications   cephALEXin 500 MG capsule Commonly known as: Keflex Stopped by: Dorita Sciara, MD     TAKE these medications   Bydureon 2 MG Pen  Generic drug: Exenatide ER INJECT 2 MG INTO THE SKIN ONCE WEEKLY   diltiazem 240 MG 24 hr capsule Commonly known as: CARDIZEM CD Take 1 capsule (240 mg total) by mouth daily.   fenofibrate 160 MG tablet TAKE 1 TABLET BY MOUTH  DAILY   glipiZIDE 5 MG 24 hr tablet Commonly known as: GLUCOTROL XL Take 1 tablet by mouth 2 (two) times daily. What changed: Another medication with the  same name was removed. Continue taking this medication, and follow the directions you see here. Changed by: Dorita Sciara, MD   losartan-hydrochlorothiazide 100-25 MG tablet Commonly known as: HYZAAR Take 1 tablet by mouth daily.   Melatonin 1 MG Tabs Take 1 mg by mouth at bedtime.   metFORMIN 500 MG 24 hr tablet Commonly known as: GLUCOPHAGE-XR TAKE 4 TABLETS BY MOUTH  ONCE DAILY   multivitamin with minerals Tabs tablet Take 1 tablet by mouth daily.   omeprazole 20 MG capsule Commonly known as: PRILOSEC TAKE 1 CAPSULE BY MOUTH  DAILY 30 TO 60 MINUTES  BEFORE BREAKFAST   Osteo Bi-Flex Adv Joint Shield Tabs   PARoxetine 40 MG tablet Commonly known as: PAXIL TAKE 1 TABLET(40 MG) BY MOUTH EVERY MORNING   pravastatin 40 MG tablet Commonly known as: PRAVACHOL Take 1 tablet (40 mg total) by mouth daily.   rOPINIRole 2 MG tablet Commonly known as: REQUIP Take 2 tablets at bedtime   Toviaz 8 MG Tb24 tablet Generic drug: fesoterodine Take 1 tablet (8 mg total) by mouth daily.   VITAMIN C PO Take by mouth daily.      PHYSICAL EXAM: VS: BP 128/70 (BP Location: Left Arm, Patient Position: Sitting, Cuff Size: Large)   Pulse 80   Temp 98.2 F (36.8 C) (Oral)   Ht 5\' 4"  (1.626 m)   Wt 245 lb (111.1 kg)   LMP 08/14/1994 (Within Months)   SpO2 97%   BMI 42.05 kg/m    EXAM: General: Pt appears well and is in NAD  Lungs: Clear with good BS bilat with no rales, rhonchi, or wheezes  Heart: Auscultation: RRR with normal S1 and S2  Abdomen: Normoactive bowel sounds, soft, nontender,  without masses or organomegaly palpable  Extremities: BL LE: Trace pretibial edema  Mental Status: Judgment, insight: intact Orientation: oriented to time, place, and person Mood and affect: no depression, anxiety, or agitation    Foot DM exma 06/19/2019 The skin of the feet is intact without sores or ulcerations. The pedal pulses are 2+ on right and 2+ on left. The sensation is absent over the great toes to a screening 5.07, 10 gram monofilament bilaterally   DATA REVIEWED:  Lab Results  Component Value Date   HGBA1C 6.2 (A) 06/19/2019   HGBA1C 6.9 (A) 03/18/2019   HGBA1C 6.7 (A) 09/20/2018    Lab Results  Component Value Date   CHOL 164 06/26/2018   HDL 38.40 (L) 06/26/2018   LDLCALC 101 (H) 06/22/2017   LDLDIRECT 108.0 06/26/2018   TRIG 232.0 (H) 06/26/2018   CHOLHDL 4 06/26/2018         ASSESSMENT / PLAN / RECOMMENDATIONS:   1)Type 2 Diabetes Mellitus, Optimally Controlled, With Neuropathic complications - Most recent A1c of  6.2 %. Goal A1c < 7.0 %.     - Diane Gregory continues to improve her glycemic control with diet. - Diane Gregory is not having any side effects from the medications, nor hypoglycemia.  - I have encouraged her to continue with current regimen and to add exercise if Diane Gregory is able to .  - Diane Gregory is due for Labs including microalbuminuria  But pt states Diane Gregory is scheduled for a physical with her PCP and will have that done.   MEDICATIONS: - Continue Metformin XR 500 at 2 tablets with meals Twice a day  - Continue Glipizide 5 mg  BID with meals  - Continue Bydureon 2 mg weekly   EDUCATION / INSTRUCTIONS:  BG monitoring  instructions: Patient is instructed to check her blood sugars 2 times a day, fasting and bedtime   Call [facility] if: BG persistently < 70 or > 300. . I reviewed the Rule of 15 for the treatment of hypoglycemia in detail with the patient. Literature supplied.   2) Diabetic complications:   Eye: Does not have known diabetic  retinopathy.   Neuro/ Feet: Does have known diabetic peripheral neuropathy.based on exam   Renal: Patient does not have known baseline CKD. Diane Gregory is  on an ACEI/ARB at present.    F/U in 4 months    Signed electronically by: Mack Guise, MD  [facility]  [address] [place],  [address] Phone: [phone] FAX: [phone]   CC: Biagio Borg, MD [place] [address] Phone: [phone]  Fax: [phone]  Return to Endocrinology clinic as below: Future Appointments  Date Time Provider [place]  07/01/2019  9:40 AM Biagio Borg, MD [facility]  10/21/2019  1:00 PM Shamleffer, Melanie Crazier, MD [facility] None  04/06/2020 11:30 AM Ward Givens, NP [facility] None

## 2019-06-19 ENCOUNTER — Ambulatory Visit: Payer: 59 | Admitting: Internal Medicine

## 2019-06-19 ENCOUNTER — Encounter: Payer: Self-pay | Admitting: Internal Medicine

## 2019-06-19 VITALS — BP 128/70 | HR 80 | Temp 98.2°F | Ht 64.0 in | Wt 245.0 lb

## 2019-06-19 DIAGNOSIS — E1165 Type 2 diabetes mellitus with hyperglycemia: Secondary | ICD-10-CM

## 2019-06-19 LAB — POCT GLYCOSYLATED HEMOGLOBIN (HGB A1C): Hemoglobin A1C: 6.2 % — AB (ref 4.0–5.6)

## 2019-06-19 NOTE — Patient Instructions (Signed)
-   Continue Metformin XR 500 at 2 tablets with meals Twice a day  - Take Glipizide 5 mg twice a day Before Breakfast and Supper - Continue Bydureon weekly     -HOW TO TREAT LOW BLOOD SUGARS (Blood sugar LESS THAN 70 MG/DL)  Please follow the RULE OF 15 for the treatment of hypoglycemia treatment (when your (blood sugars are less than 70 mg/dL)    STEP 1: Take 15 grams of carbohydrates when your blood sugar is low, which includes:   3-4 GLUCOSE TABS  OR  3-4 OZ OF JUICE OR REGULAR SODA OR  ONE TUBE OF GLUCOSE GEL     STEP 2: RECHECK blood sugar in 15 MINUTES STEP 3: If your blood sugar is still low at the 15 minute recheck --> then, go back to STEP 1 and treat AGAIN with another 15 grams of carbohydrates.   

## 2019-06-30 ENCOUNTER — Encounter: Payer: Self-pay | Admitting: Internal Medicine

## 2019-06-30 ENCOUNTER — Other Ambulatory Visit (INDEPENDENT_AMBULATORY_CARE_PROVIDER_SITE_OTHER): Payer: 59

## 2019-06-30 ENCOUNTER — Ambulatory Visit (INDEPENDENT_AMBULATORY_CARE_PROVIDER_SITE_OTHER): Payer: 59 | Admitting: Internal Medicine

## 2019-06-30 ENCOUNTER — Other Ambulatory Visit: Payer: Self-pay

## 2019-06-30 VITALS — BP 124/82 | HR 73 | Temp 98.2°F | Wt 247.1 lb

## 2019-06-30 DIAGNOSIS — E559 Vitamin D deficiency, unspecified: Secondary | ICD-10-CM

## 2019-06-30 DIAGNOSIS — G2581 Restless legs syndrome: Secondary | ICD-10-CM

## 2019-06-30 DIAGNOSIS — E1165 Type 2 diabetes mellitus with hyperglycemia: Secondary | ICD-10-CM

## 2019-06-30 DIAGNOSIS — Z0000 Encounter for general adult medical examination without abnormal findings: Secondary | ICD-10-CM

## 2019-06-30 DIAGNOSIS — E538 Deficiency of other specified B group vitamins: Secondary | ICD-10-CM | POA: Diagnosis not present

## 2019-06-30 DIAGNOSIS — E611 Iron deficiency: Secondary | ICD-10-CM

## 2019-06-30 DIAGNOSIS — E782 Mixed hyperlipidemia: Secondary | ICD-10-CM | POA: Diagnosis not present

## 2019-06-30 DIAGNOSIS — K2100 Gastro-esophageal reflux disease with esophagitis, without bleeding: Secondary | ICD-10-CM

## 2019-06-30 DIAGNOSIS — K760 Fatty (change of) liver, not elsewhere classified: Secondary | ICD-10-CM

## 2019-06-30 HISTORY — DX: Fatty (change of) liver, not elsewhere classified: K76.0

## 2019-06-30 LAB — CBC WITH DIFFERENTIAL/PLATELET
Basophils Absolute: 0.1 10*3/uL (ref 0.0–0.1)
Basophils Relative: 0.8 % (ref 0.0–3.0)
Eosinophils Absolute: 0.2 10*3/uL (ref 0.0–0.7)
Eosinophils Relative: 2.3 % (ref 0.0–5.0)
HCT: 39.6 % (ref 36.0–46.0)
Hemoglobin: 13.5 g/dL (ref 12.0–15.0)
Lymphocytes Relative: 31.2 % (ref 12.0–46.0)
Lymphs Abs: 3 10*3/uL (ref 0.7–4.0)
MCHC: 34.1 g/dL (ref 30.0–36.0)
MCV: 85.2 fl (ref 78.0–100.0)
Monocytes Absolute: 0.8 10*3/uL (ref 0.1–1.0)
Monocytes Relative: 8.8 % (ref 3.0–12.0)
Neutro Abs: 5.4 10*3/uL (ref 1.4–7.7)
Neutrophils Relative %: 56.9 % (ref 43.0–77.0)
Platelets: 224 10*3/uL (ref 150.0–400.0)
RBC: 4.65 Mil/uL (ref 3.87–5.11)
RDW: 13.8 % (ref 11.5–15.5)
WBC: 9.5 10*3/uL (ref 4.0–10.5)

## 2019-06-30 LAB — LIPID PANEL
Cholesterol: 150 mg/dL (ref 0–200)
HDL: 39.6 mg/dL (ref >39.00)
NonHDL: 109.98
Total CHOL/HDL Ratio: 4
Triglycerides: 206 mg/dL — ABNORMAL HIGH (ref 0.0–149.0)
VLDL: 41.2 mg/dL — ABNORMAL HIGH (ref 0.0–40.0)

## 2019-06-30 LAB — TSH: TSH: 1.84 u[IU]/mL (ref 0.35–4.50)

## 2019-06-30 LAB — HEPATIC FUNCTION PANEL
ALT: 45 U/L — ABNORMAL HIGH (ref 0–35)
AST: 34 U/L (ref 0–37)
Albumin: 4.5 g/dL (ref 3.5–5.2)
Alkaline Phosphatase: 100 U/L (ref 39–117)
Bilirubin, Direct: 0.1 mg/dL (ref 0.0–0.3)
Total Bilirubin: 0.4 mg/dL (ref 0.2–1.2)
Total Protein: 7.2 g/dL (ref 6.0–8.3)

## 2019-06-30 LAB — URINALYSIS, ROUTINE W REFLEX MICROSCOPIC
Bilirubin Urine: NEGATIVE
Hgb urine dipstick: NEGATIVE
Ketones, ur: NEGATIVE
Leukocytes,Ua: NEGATIVE
Nitrite: NEGATIVE
RBC / HPF: NONE SEEN (ref 0–?)
Specific Gravity, Urine: 1.02 (ref 1.000–1.030)
Total Protein, Urine: NEGATIVE
Urine Glucose: NEGATIVE
Urobilinogen, UA: 0.2 (ref 0.0–1.0)
pH: 6 (ref 5.0–8.0)

## 2019-06-30 LAB — MICROALBUMIN / CREATININE URINE RATIO
Creatinine,U: 76.8 mg/dL
Microalb Creat Ratio: 1.8 mg/g (ref 0.0–30.0)
Microalb, Ur: 1.4 mg/dL (ref 0.0–1.9)

## 2019-06-30 LAB — BASIC METABOLIC PANEL
BUN: 22 mg/dL (ref 6–23)
CO2: 28 mEq/L (ref 19–32)
Calcium: 9.5 mg/dL (ref 8.4–10.5)
Chloride: 98 mEq/L (ref 96–112)
Creatinine, Ser: 0.56 mg/dL (ref 0.40–1.20)
GFR: 108.82 mL/min (ref >60.00)
Glucose, Bld: 149 mg/dL — ABNORMAL HIGH (ref 70–99)
Potassium: 4.1 mEq/L (ref 3.5–5.1)
Sodium: 136 mEq/L (ref 135–145)

## 2019-06-30 LAB — IBC PANEL
Iron: 80 ug/dL (ref 42–145)
Saturation Ratios: 17.6 % — ABNORMAL LOW (ref 20.0–50.0)
Transferrin: 325 mg/dL (ref 212.0–360.0)

## 2019-06-30 LAB — LDL CHOLESTEROL, DIRECT: Direct LDL: 85 mg/dL

## 2019-06-30 LAB — VITAMIN D 25 HYDROXY (VIT D DEFICIENCY, FRACTURES): VITD: 35.07 ng/mL (ref 30.00–100.00)

## 2019-06-30 LAB — VITAMIN B12: Vitamin B-12: 248 pg/mL (ref 211–911)

## 2019-06-30 MED ORDER — ZOSTER VAC RECOMB ADJUVANTED 50 MCG/0.5ML IM SUSR: 0.5000 mL | Freq: Once | INTRAMUSCULAR | 1 refills | Status: AC

## 2019-06-30 MED ORDER — FENOFIBRATE 160 MG PO TABS: ORAL_TABLET | ORAL | 0 refills | Status: DC

## 2019-06-30 MED ORDER — BYDUREON 2 MG ~~LOC~~ PEN: PEN_INJECTOR | SUBCUTANEOUS | 1 refills | Status: DC

## 2019-06-30 MED ORDER — DILTIAZEM HCL ER COATED BEADS 240 MG PO CP24: 240.0000 mg | ORAL_CAPSULE | Freq: Every day | ORAL | 3 refills | Status: DC

## 2019-06-30 MED ORDER — METFORMIN HCL ER 500 MG PO TB24: ORAL_TABLET | ORAL | 3 refills | Status: DC

## 2019-06-30 MED ORDER — LOSARTAN POTASSIUM-HCTZ 100-25 MG PO TABS: 1.0000 | ORAL_TABLET | Freq: Every day | ORAL | 3 refills | Status: DC

## 2019-06-30 MED ORDER — PAROXETINE HCL 40 MG PO TABS: ORAL_TABLET | ORAL | 3 refills | Status: DC

## 2019-06-30 MED ORDER — OMEPRAZOLE 20 MG PO CPDR: DELAYED_RELEASE_CAPSULE | ORAL | 3 refills | Status: DC

## 2019-06-30 MED ORDER — PRAVASTATIN SODIUM 40 MG PO TABS: 40.0000 mg | ORAL_TABLET | Freq: Every day | ORAL | 3 refills | Status: DC

## 2019-06-30 MED ORDER — TOVIAZ 8 MG PO TB24: 8.0000 mg | ORAL_TABLET | Freq: Every day | ORAL | 0 refills | Status: DC

## 2019-06-30 MED ORDER — ROPINIROLE HCL 2 MG PO TABS: ORAL_TABLET | ORAL | 3 refills | Status: DC

## 2019-06-30 MED ORDER — GLIPIZIDE ER 5 MG PO TB24: 5.0000 mg | ORAL_TABLET | Freq: Two times a day (BID) | ORAL | 3 refills | Status: DC

## 2019-06-30 NOTE — Assessment & Plan Note (Signed)
Recent a1c normal,  to f/u endo

## 2019-06-30 NOTE — Patient Instructions (Addendum)
Your shingles shot prescription was sent to the pharmacy  Please continue all other medications as before, and refills have been done if requested.  Please have the pharmacy call with any other refills you may need.  Please continue your efforts at being more active, low cholesterol diet, and weight control.  You are otherwise up to date with prevention measures today.  Please keep your appointments with your specialists as you may have planned  Please go to the LAB in the Basement (turn left off the elevator) for the tests to be done today  You will be contacted by phone if any changes need to be made immediately.  Otherwise, you will receive a letter about your results with an explanation, but please check with MyChart first.  Please remember to sign up for MyChart if you have not done so, as this will be important to you in the future with finding out test results, communicating by private email, and scheduling acute appointments online when needed.  Please return in 1 year for your yearly visit, or sooner if needed, with Lab testing done 3-5 days before

## 2019-06-30 NOTE — Assessment & Plan Note (Signed)

## 2019-06-30 NOTE — Progress Notes (Signed)
Subjective:    Patient ID: Diane Gregory, female    DOB: 10-26-1955, 63 y.o.   MRN: [id number]  HPI  Here for wellness and f/u;  Overall doing ok;  Pt denies Chest pain, worsening SOB, DOE, wheezing, orthopnea, PND, worsening LE edema, palpitations, dizziness or syncope.  Pt denies neurological change such as new headache, facial or extremity weakness.  Pt denies polydipsia, polyuria, or low sugar symptoms. Pt states overall good compliance with treatment and medications, good tolerability, and has been trying to follow appropriate diet.  Pt denies worsening depressive symptoms, suicidal ideation or panic. No fever, night sweats, wt loss, loss of appetite, or other constitutional symptoms.  Pt states good ability with ADL's, has low fall risk, home safety reviewed and adequate, no other significant changes in hearing or vision, and only occasionally active with exercise. Laid off mar 2020 with pandemic, then just retired with severence.  No new complaints Past Medical History:  Diagnosis Date  . Anxiety   . Arthritis    "was in my knees before OR; now in my hands" (05/05/2014)  . Bronchitis   . Chronic bronchitis ([place])    "used to get it q yr; not as much since septal repair in 2004"  . Depression   . Family history of bone cancer   . Family history of breast cancer   . Family history of carrier of hereditary disease   . Family history of CLL (chronic lymphoid leukemia)   . Family history of testicular cancer   . Fatty liver 06/30/2019  . GERD (gastroesophageal reflux disease)   . High triglycerides   . HSV infection   . Hyperlipidemia   . Hypertension   . Left knee DJD   . Obesity   . Primary localized osteoarthritis of right knee   . Restless leg syndrome   . Sleep apnea    mild; does not wear CPAP  . Type II diabetes mellitus ([place])   . Urgency of urination    and frequency takes Vesicare  . Urinary incontinence   . Walking pneumonia ~ 2013   Past Surgical History:  Procedure  Laterality Date  . COLONOSCOPY    . HYSTEROSCOPY  11/94   submuous myomas  . KNEE ARTHROSCOPY Right ~ 2010  . KNEE ARTHROSCOPY W/ MENISCECTOMY Right 2010  . NASAL SEPTUM SURGERY    . REFRACTIVE SURGERY Bilateral ~ 2005  . TONSILLECTOMY  ~ 2004  . TOTAL KNEE ARTHROPLASTY Left 08/11/2013   Procedure: LEFT TOTAL KNEE ARTHROPLASTY;  Surgeon: Lorn Junes, MD;  Location: [facility];  Service: Orthopedics;  Laterality: Left;  . TOTAL KNEE ARTHROPLASTY Right 05/04/2014   Procedure: RIGHT TOTAL KNEE ARTHROPLASTY;  Surgeon: Lorn Junes, MD;  Location: [facility];  Service: Orthopedics;  Laterality: Right;  . UVULOPALATOPHARYNGOPLASTY, TONSILLECTOMY AND SEPTOPLASTY  2004  . VAGINAL HYSTERECTOMY  06/1993    reports that she has never smoked. She has never used smokeless tobacco. She reports previous alcohol use. She reports that she does not use drugs. family history includes Bone cancer (age of onset: 6) in her maternal grandfather; Breast cancer in her cousin, maternal grandmother, paternal aunt, and paternal grandmother; Breast cancer (age of onset: 58) in her mother; Cancer in her father; Diabetes Mellitus II in her father; Heart disease in her mother; Hypertension in her brother and mother; Stroke in her mother; Testicular cancer (age of onset: 95) in her brother. Allergies  Allergen Reactions  . Celebrex [Celecoxib] Swelling  .  Lisinopril Cough  . Meloxicam Swelling  . Nsaids Swelling  . Penicillins Other (See Comments)    Drop in blood pressure  . Statins Other (See Comments)    Myalgias, edema Other reaction(s): Other (See Comments) edema Other reaction(s): Other (See Comments) Myalgias, edema  . Lidocaine Rash    "bad taste in mouth" with dental work   Current Outpatient Medications on File Prior to Visit  Medication Sig Dispense Refill  . Ascorbic Acid (VITAMIN C PO) Take by mouth daily.    . Melatonin 1 MG TABS Take 1 mg by mouth at bedtime.    . Misc Natural Products (OSTEO  BI-FLEX ADV JOINT SHIELD) TABS     . Multiple Vitamin (MULTIVITAMIN WITH MINERALS) TABS tablet Take 1 tablet by mouth daily.    Marland Kitchen tobramycin-dexamethasone (TOBRADEX) ophthalmic solution Use 1 drop in left eye four times a day     No current facility-administered medications on file prior to visit.    Review of Systems Constitutional: Negative for other unusual diaphoresis, sweats, appetite or weight changes HENT: Negative for other worsening hearing loss, ear pain, facial swelling, mouth sores or neck stiffness.   Eyes: Negative for other worsening pain, redness or other visual disturbance.  Respiratory: Negative for other stridor or swelling Cardiovascular: Negative for other palpitations or other chest pain  Gastrointestinal: Negative for worsening diarrhea or loose stools, blood in stool, distention or other pain Genitourinary: Negative for hematuria, flank pain or other change in urine volume.  Musculoskeletal: Negative for myalgias or other joint swelling.  Skin: Negative for other color change, or other wound or worsening drainage.  Neurological: Negative for other syncope or numbness. Hematological: Negative for other adenopathy or swelling Psychiatric/Behavioral: Negative for hallucinations, other worsening agitation, SI, self-injury, or new decreased concentration All otherwise neg per pt     Objective:   Physical Exam BP 124/82   Pulse 73   Temp 98.2 F (36.8 C) (Oral)   Wt 247 lb 1.9 oz (112.1 kg)   LMP 08/14/1994 (Within Months)   SpO2 96%   BMI 42.42 kg/m  VS noted,  Constitutional: Pt is oriented to person, place, and time. Appears well-developed and well-nourished, in no significant distress and comfortable Head: Normocephalic and atraumatic  Eyes: Conjunctivae and EOM are normal. Pupils are equal, round, and reactive to light Right Ear: External ear normal without discharge Left Ear: External ear normal without discharge Nose: Nose without discharge or deformity  Mouth/Throat: Oropharynx is without other ulcerations and moist  Neck: Normal range of motion. Neck supple. No JVD present. No tracheal deviation present or significant neck LA or mass Cardiovascular: Normal rate, regular rhythm, normal heart sounds and intact distal pulses.   Pulmonary/Chest: WOB normal and breath sounds without rales or wheezing  Abdominal: Soft. Bowel sounds are normal. NT. No HSM  Musculoskeletal: Normal range of motion. Exhibits no edema Lymphadenopathy: Has no other cervical adenopathy.  Neurological: Pt is alert and oriented to person, place, and time. Pt has normal reflexes. No cranial nerve deficit. Motor grossly intact, Gait intact Skin: Skin is warm and dry. No rash noted or new ulcerations Psychiatric:  Has normal mood and affect. Behavior is normal without agitation All otherwise neg per pt  Lab Results  Component Value Date   WBC 7.7 06/26/2018   HGB 13.7 06/26/2018   HCT 39.4 06/26/2018   PLT 215.0 06/26/2018   GLUCOSE 212 (H) 06/26/2018   CHOL 164 06/26/2018   TRIG 232.0 (H) 06/26/2018   HDL  38.40 (L) 06/26/2018   LDLDIRECT 108.0 06/26/2018   LDLCALC 101 (H) 06/22/2017   ALT 57 (H) 06/26/2018   AST 51 (H) 06/26/2018   NA 138 06/26/2018   K 4.0 06/26/2018   CL 100 06/26/2018   CREATININE 0.58 06/26/2018   BUN 18 06/26/2018   CO2 28 06/26/2018   TSH 1.30 06/22/2017   INR 1.00 12/04/2015   HGBA1C 6.2 (A) 06/19/2019   MICROALBUR <0.7 09/18/2016       Assessment & Plan:

## 2019-07-01 ENCOUNTER — Encounter: Payer: 59 | Admitting: Internal Medicine

## 2019-08-20 ENCOUNTER — Other Ambulatory Visit: Payer: Self-pay | Admitting: Internal Medicine

## 2019-08-21 NOTE — Telephone Encounter (Signed)
Please refill as per office routine med refill policy (all routine meds refilled for 3 mo or monthly per pt preference up to one year from last visit, then month to month grace period for 3 mo, then further med refills will have to be denied)  

## 2019-09-05 ENCOUNTER — Encounter: Payer: Self-pay | Admitting: Internal Medicine

## 2019-10-21 ENCOUNTER — Other Ambulatory Visit: Payer: Self-pay

## 2019-10-21 ENCOUNTER — Encounter: Payer: Self-pay | Admitting: Internal Medicine

## 2019-10-21 ENCOUNTER — Ambulatory Visit: Payer: 59 | Admitting: Internal Medicine

## 2019-10-21 VITALS — BP 122/62 | HR 84 | Temp 98.1°F | Ht 64.0 in | Wt 250.4 lb

## 2019-10-21 DIAGNOSIS — E1142 Type 2 diabetes mellitus with diabetic polyneuropathy: Secondary | ICD-10-CM | POA: Diagnosis not present

## 2019-10-21 LAB — POCT GLYCOSYLATED HEMOGLOBIN (HGB A1C): Hemoglobin A1C: 6.7 % — AB (ref 4.0–5.6)

## 2019-10-21 NOTE — Progress Notes (Signed)
Name: Diane Gregory  Age/ Sex: 64 y.o., female   MRN/ DOB: [id number], February 26, 1956     PCP: Biagio Borg, MD   Reason for Endocrinology Evaluation: Type 2 Diabetes Mellitus  Initial Endocrine Consultative Visit: 08/02/18    PATIENT IDENTIFIER: Diane Gregory is a 64 y.o. female with a past medical history of HTN, OSA, T2DM and DJD . The patient has followed with Endocrinology clinic since 08/02/18 for consultative assistance with management of her diabetes.  DIABETIC HISTORY:  Diane Gregory was diagnosed with T2DM in 2009, she has been on oral glycemic agents since her diagnosis, on her initial presentation to our clinic she was on Bydureon 2 mg weekly, glipizide ER 7-1/2 mg with breakfast, metformin XR 500 mg 2 tablets with breakfast. Her hemoglobin A1c has ranged from 6.5%  In 12/2017 , peaking at 8.3% in 2017  SUBJECTIVE:   During the last visit (06/19/2019): Her A1c was 6.2% .  We continued Glipizide, Bydureon 2 mg weekly, and  Metformin.    Today (10/21/2019): [name] is here for a 3 month follow-up on her diabetes management. She checks her blood sugars before meals and at bedtime daily, through her CGM. The patient has not had hypoglycemic episodes since the last clinic visit. She continues to snack at bedtime sometimes its cereal and at times its cheese. Otherwise, the patient has not required any recent emergency interventions for hypoglycemia and has not had recent hospitalizations secondary to hyper or hypoglycemic episodes.    ROS: As per HPI and as detailed below: Review of Systems  Constitutional: Negative for fever and weight loss.  HENT: Negative for congestion and sore throat.   Respiratory: Negative for cough and shortness of breath.   Cardiovascular: Negative for chest pain and palpitations.  Gastrointestinal: Negative for diarrhea and nausea.  Genitourinary: Negative for frequency.  Endo/Heme/Allergies: Negative for polydipsia.      HOME DIABETES REGIMEN:     Glipizide 5 mg BID  Metformin XR 500 mg Two tabs BID  Bydureon at 2 mg weekly      CONTINUOUS GLUCOSE MONITORING RECORD INTERPRETATION    Dates of Recording: 2/24-10/21/2019  Sensor description: Dexcom   Results statistics:   CGM use % of time 93  Average and SD 167/38  Time in range   67 %  % Time Above 180 31  % Time above 250 2  % Time Below target 0    Glycemic patterns summary: she was noted with hyperglycemia mainly around 6 pm   Hypoglycemic episodes occurred none  Overnight periods: Within goal       HISTORY:  Past Medical History:  Past Medical History:  Diagnosis Date  . Anxiety   . Arthritis    "was in my knees before OR; now in my hands" (05/05/2014)  . Bronchitis   . Chronic bronchitis ([place])    "used to get it q yr; not as much since septal repair in 2004"  . Depression   . Family history of bone cancer   . Family history of breast cancer   . Family history of carrier of hereditary disease   . Family history of CLL (chronic lymphoid leukemia)   . Family history of testicular cancer   . Fatty liver 06/30/2019  . GERD (gastroesophageal reflux disease)   . High triglycerides   . HSV infection   . Hyperlipidemia   . Hypertension   . Left knee DJD   . Obesity   .  Primary localized osteoarthritis of right knee   . Restless leg syndrome   . Sleep apnea    mild; does not wear CPAP  . Type II diabetes mellitus ([place])   . Urgency of urination    and frequency takes Vesicare  . Urinary incontinence   . Walking pneumonia ~ 2013   Past Surgical History:  Past Surgical History:  Procedure Laterality Date  . COLONOSCOPY    . HYSTEROSCOPY  11/94   submuous myomas  . KNEE ARTHROSCOPY Right ~ 2010  . KNEE ARTHROSCOPY W/ MENISCECTOMY Right 2010  . NASAL SEPTUM SURGERY    . REFRACTIVE SURGERY Bilateral ~ 2005  . TONSILLECTOMY  ~ 2004  . TOTAL KNEE ARTHROPLASTY Left 08/11/2013   Procedure: LEFT TOTAL KNEE ARTHROPLASTY;  Surgeon: Lorn Junes, MD;  Location: [facility];  Service: Orthopedics;  Laterality: Left;  . TOTAL KNEE ARTHROPLASTY Right 05/04/2014   Procedure: RIGHT TOTAL KNEE ARTHROPLASTY;  Surgeon: Lorn Junes, MD;  Location: [facility];  Service: Orthopedics;  Laterality: Right;  . UVULOPALATOPHARYNGOPLASTY, TONSILLECTOMY AND SEPTOPLASTY  2004  . VAGINAL HYSTERECTOMY  06/1993    Social History:  reports that she has never smoked. She has never used smokeless tobacco. She reports previous alcohol use. She reports that she does not use drugs. Family History:  Family History  Problem Relation Age of Onset  . Heart disease Mother   . Hypertension Mother   . Stroke Mother   . Breast cancer Mother 38  . Diabetes Mellitus II Father   . Cancer Father        CLL  . Hypertension Brother   . Testicular cancer Brother 34  . Breast cancer Paternal Grandmother        diagnosed 61s  . Breast cancer Maternal Grandmother        diagnosed 40s  . Breast cancer Paternal Aunt   . Bone cancer Maternal Grandfather 70  . Breast cancer Cousin        paternal first cousin, diagnosed 29s     HOME MEDICATIONS: Allergies as of 10/21/2019      Reactions   Celebrex [celecoxib] Swelling   Lisinopril Cough   Meloxicam Swelling   Nsaids Swelling   Penicillins Other (See Comments)   Drop in blood pressure   Statins Other (See Comments)   Myalgias, edema Other reaction(s): Other (See Comments) edema Other reaction(s): Other (See Comments) Myalgias, edema   Lidocaine Rash   "bad taste in mouth" with dental work      Medication List       Accurate as of October 21, 2019  2:54 PM. If you have any questions, ask your nurse or doctor.        Bydureon 2 MG Pen Generic drug: Exenatide ER INJECT 2 MG INTO THE SKIN ONCE WEEKLY   Dexcom G5 Receiver Kit Devi by Does not apply route.   diltiazem 240 MG 24 hr capsule Commonly known as: CARDIZEM CD Take 1 capsule (240 mg total) by mouth daily.   fenofibrate 160 MG tablet TAKE 1  TABLET BY MOUTH  DAILY   glipiZIDE 5 MG 24 hr tablet Commonly known as: GLUCOTROL XL Take 1 tablet (5 mg total) by mouth 2 (two) times daily.   losartan-hydrochlorothiazide 100-25 MG tablet Commonly known as: HYZAAR Take 1 tablet by mouth daily.   Melatonin 1 MG Tabs Take 1 mg by mouth at bedtime.   metFORMIN 500 MG 24 hr tablet Commonly known as: GLUCOPHAGE-XR Take 4  tablets by mouth once a day   multivitamin with minerals Tabs tablet Take 1 tablet by mouth daily.   omeprazole 20 MG capsule Commonly known as: PRILOSEC TAKE 1 CAPSULE BY MOUTH  DAILY 30 TO 60 MINUTES  BEFORE BREAKFAST   Osteo Bi-Flex Adv Joint Shield Tabs   PARoxetine 40 MG tablet Commonly known as: PAXIL TAKE 1 TABLET(40 MG) BY MOUTH EVERY MORNING   pravastatin 40 MG tablet Commonly known as: PRAVACHOL Take 1 tablet (40 mg total) by mouth daily.   rOPINIRole 2 MG tablet Commonly known as: REQUIP Take 2 tablets at bedtime   tobramycin-dexamethasone ophthalmic solution Commonly known as: TOBRADEX Use 1 drop in left eye four times a day   Toviaz 8 MG Tb24 tablet Generic drug: fesoterodine TAKE 1 TABLET BY MOUTH  DAILY   VITAMIN C PO Take by mouth daily.      PHYSICAL EXAM: VS: BP 122/62 (BP Location: Left Arm, Patient Position: Sitting, Cuff Size: Large)   Pulse 84   Temp 98.1 F (36.7 C)   Ht '5\' 4"'$  (1.626 m)   Wt 250 lb 6.4 oz (113.6 kg)   LMP 08/14/1994 (Within Months)   SpO2 98%   BMI 42.98 kg/m    EXAM: General: Pt appears well and is in NAD  Lungs: Clear with good BS bilat with no rales, rhonchi, or wheezes  Heart: Auscultation: RRR with normal S1 and S2  Abdomen: Normoactive bowel sounds, soft, nontender, without masses or organomegaly palpable  Extremities: BL LE: Trace pretibial edema  Mental Status: Judgment, insight: intact Orientation: oriented to time, place, and person Mood and affect: no depression, anxiety, or agitation    Foot DM exma 10/21/2019 The skin of the  feet is intact without sores or ulcerations. The pedal pulses are 1+ on right and 1+ on left. The sensation is absent over the great toes to a screening 5.07, 10 gram monofilament bilaterally   DATA REVIEWED:  Lab Results  Component Value Date   HGBA1C 6.7 (A) 10/21/2019   HGBA1C 6.2 (A) 06/19/2019   HGBA1C 6.9 (A) 03/18/2019    Lab Results  Component Value Date   CHOL 150 06/30/2019   HDL 39.60 06/30/2019   LDLCALC 101 (H) 06/22/2017   LDLDIRECT 85.0 06/30/2019   TRIG 206.0 (H) 06/30/2019   CHOLHDL 4 06/30/2019         ASSESSMENT / PLAN / RECOMMENDATIONS:   1)Type 2 Diabetes Mellitus, Optimally Controlled, With Neuropathic complications - Most recent A1c of  6.7 %. Goal A1c < 7.0 %.     - Despite recent hyperglycemic episodes , her A1c remains at goal, it did increased from 6.2% to 6.7% . She has made dietary changes in the recent few weeks and feels this has helped.  - she would like not to make any changes at this time.  - We have discussed that if her supper time remains an issues , we could always increase her Glipizide dose to 1.5 tablets.  - We also discussed trying to change the Bydureon to Ozempic or Trulicity but she just received a 3 month supply, she is also switching to medicare 5/1st     MEDICATIONS: - Continue Metformin XR 500 at 2 tablets with meals Twice a day  - Continue Glipizide 5 mg  BID with meals  - Continue Bydureon 2 mg weekly   EDUCATION / INSTRUCTIONS:  BG monitoring instructions: Patient is instructed to check her blood sugars 2 times a day, fasting and bedtime  Call [facility] if: BG persistently < 70 or > 300. . I reviewed the Rule of 15 for the treatment of hypoglycemia in detail with the patient. Literature supplied.   2) Diabetic complications:   Eye: Does not have known diabetic retinopathy.   Neuro/ Feet: Does have known diabetic peripheral neuropathy.based on exam   Renal: Patient does not have known  baseline CKD. She is  on an ACEI/ARB at present.    F/U in 5 months    Signed electronically by: Mack Guise, MD  [facility]  [facility] [address] [place], [place] [address] Phone: [phone] FAX: [phone]   CC: [address]ag[address], [place] [address] Phone: [phone]  Fax: [phone]  Return to Endocrinology clinic as below: Future Appointments  Date Time Provider [place]  04/05/2020  1:00 PM Roshon Duell, Melanie Crazier, MD [facility] None  04/06/2020 11:30 AM Ward Givens, NP [facility] None  06/30/2020  9:00 AM Biagio Borg, MD [facility] None

## 2019-10-21 NOTE — Patient Instructions (Signed)
-   Continue Metformin XR 500 at 2 tablets with meals Twice a day  - Take Glipizide 5 mg twice a day Before Breakfast and Supper - Continue Bydureon weekly     -HOW TO TREAT LOW BLOOD SUGARS (Blood sugar LESS THAN 70 MG/DL)  Please follow the RULE OF 15 for the treatment of hypoglycemia treatment (when your (blood sugars are less than 70 mg/dL)    STEP 1: Take 15 grams of carbohydrates when your blood sugar is low, which includes:   3-4 GLUCOSE TABS  OR  3-4 OZ OF JUICE OR REGULAR SODA OR  ONE TUBE OF GLUCOSE GEL     STEP 2: RECHECK blood sugar in 15 MINUTES STEP 3: If your blood sugar is still low at the 15 minute recheck --> then, go back to STEP 1 and treat AGAIN with another 15 grams of carbohydrates.

## 2019-11-11 ENCOUNTER — Encounter: Payer: Self-pay | Admitting: Internal Medicine

## 2019-12-15 ENCOUNTER — Other Ambulatory Visit: Payer: Self-pay | Admitting: Internal Medicine

## 2019-12-15 NOTE — Telephone Encounter (Signed)
Pt see Dr. Kelton Pillar, Melanie Crazier for diabetes forwarding to endo for refill.Marland KitchenJohny Gregory

## 2019-12-17 ENCOUNTER — Other Ambulatory Visit: Payer: Self-pay | Admitting: Internal Medicine

## 2019-12-22 ENCOUNTER — Encounter: Payer: Self-pay | Admitting: Internal Medicine

## 2019-12-23 ENCOUNTER — Other Ambulatory Visit: Payer: Self-pay

## 2019-12-23 DIAGNOSIS — K2100 Gastro-esophageal reflux disease with esophagitis, without bleeding: Secondary | ICD-10-CM

## 2019-12-23 DIAGNOSIS — G2581 Restless legs syndrome: Secondary | ICD-10-CM

## 2019-12-23 MED ORDER — LOSARTAN POTASSIUM-HCTZ 100-25 MG PO TABS: 1.0000 | ORAL_TABLET | Freq: Every day | ORAL | 3 refills | Status: DC

## 2019-12-23 MED ORDER — FENOFIBRATE 160 MG PO TABS: 160.0000 mg | ORAL_TABLET | Freq: Every day | ORAL | 3 refills | Status: DC

## 2019-12-23 MED ORDER — OZEMPIC (0.25 OR 0.5 MG/DOSE) 2 MG/1.5ML ~~LOC~~ SOPN: 0.5000 mg | PEN_INJECTOR | SUBCUTANEOUS | 1 refills | Status: DC

## 2019-12-23 MED ORDER — OMEPRAZOLE 20 MG PO CPDR: DELAYED_RELEASE_CAPSULE | ORAL | 3 refills | Status: DC

## 2019-12-23 MED ORDER — ROPINIROLE HCL 2 MG PO TABS: ORAL_TABLET | ORAL | 3 refills | Status: DC

## 2019-12-23 MED ORDER — GLIPIZIDE ER 5 MG PO TB24: 5.0000 mg | ORAL_TABLET | Freq: Two times a day (BID) | ORAL | 3 refills | Status: DC

## 2019-12-26 ENCOUNTER — Telehealth: Payer: Self-pay | Admitting: Obstetrics and Gynecology

## 2019-12-26 ENCOUNTER — Other Ambulatory Visit: Payer: Self-pay | Admitting: Internal Medicine

## 2019-12-26 DIAGNOSIS — Z1501 Genetic susceptibility to malignant neoplasm of breast: Secondary | ICD-10-CM

## 2019-12-26 DIAGNOSIS — Z1502 Genetic susceptibility to malignant neoplasm of ovary: Secondary | ICD-10-CM

## 2019-12-26 DIAGNOSIS — Z1231 Encounter for screening mammogram for malignant neoplasm of breast: Secondary | ICD-10-CM

## 2019-12-26 DIAGNOSIS — Z803 Family history of malignant neoplasm of breast: Secondary | ICD-10-CM

## 2019-12-26 DIAGNOSIS — Z9189 Other specified personal risk factors, not elsewhere classified: Secondary | ICD-10-CM

## 2019-12-26 NOTE — Telephone Encounter (Signed)
Spoke with patient. Patient is requesting an order for yearly screening MRI. 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 Per review of Epic, last screening MMG 02/20/19. Recommended patient proceed with scheduling screening MMG on or after 02/20/20, this will need to be completed prior to MRI. Will review screening breast MRI with Dr. Quincy Simmonds and return call with recommendations, patient agreeable.    Last MRI 02/15/2018  Order pended for MRI breast bilateral w/wo contrast Dx: increased lifetime risk, family Hx breast cancer, CHEK2 mutation  Lifetime risk 21.8% prior to genetics counseling.   Dr. Quincy Simmonds -please review and advise.

## 2019-12-26 NOTE — Telephone Encounter (Signed)
Patient need order for mammogram and MRI.

## 2019-12-27 NOTE — Telephone Encounter (Signed)
I agree with mammogram first and then breast MRI.

## 2019-12-29 NOTE — Telephone Encounter (Signed)
Breast MRI order placed, to be scheduled after screening MMG.   Call to patient to notify, spoke with patient, she is scheduled for screening MMG 03/30/20. Advised once MMG completed and MRI scheduled, our office will precert. [facility] will contact you directly to schedule the breast MRI. Questions answered.  Patient verbalizes understanding.   Encounter closed.

## 2019-12-31 ENCOUNTER — Other Ambulatory Visit: Payer: Self-pay | Admitting: Internal Medicine

## 2020-01-01 ENCOUNTER — Telehealth: Payer: Self-pay | Admitting: Internal Medicine

## 2020-01-01 NOTE — Telephone Encounter (Signed)
New message:   Basilia Jumbo is calling from [facility] and states the pt has a potential allergic reaction with losartan-hydrochlorothiazide (HYZAAR) 100-25 MG tablet. She states they will place the prescription on hold until they receive a call back. Please advise.

## 2020-01-02 NOTE — Telephone Encounter (Signed)
Ok to take, no problems

## 2020-01-02 NOTE — Telephone Encounter (Signed)
Notified [facility] spoke w/Lee gave MD response../l;[id number]

## 2020-01-21 ENCOUNTER — Other Ambulatory Visit: Payer: Self-pay

## 2020-01-21 ENCOUNTER — Encounter: Payer: Self-pay | Admitting: Internal Medicine

## 2020-01-21 MED ORDER — BYDUREON BCISE 2 MG/0.85ML ~~LOC~~ AUIJ: 2.0000 mg | AUTO-INJECTOR | SUBCUTANEOUS | 3 refills | Status: DC

## 2020-01-27 ENCOUNTER — Encounter: Payer: Self-pay | Admitting: Adult Health

## 2020-01-28 ENCOUNTER — Telehealth (INDEPENDENT_AMBULATORY_CARE_PROVIDER_SITE_OTHER): Payer: Medicare Other | Admitting: Adult Health

## 2020-01-28 DIAGNOSIS — G4733 Obstructive sleep apnea (adult) (pediatric): Secondary | ICD-10-CM | POA: Diagnosis not present

## 2020-01-28 NOTE — Progress Notes (Signed)
Order for cpap supplies sent to [facility] via [facility]. Confirmation received that the order transmitted was successful. 

## 2020-01-28 NOTE — Progress Notes (Signed)
PATIENT: Petrina P Borelli DOB: 1956-06-13  REASON FOR VISIT: follow up HISTORY FROM: patient  Virtual Visit via Video Note  I connected with Palmer on 01/28/20 at  1:30 PM EDT by a video enabled telemedicine application located remotely at [facility] and verified that I am speaking with the correct person using two identifiers who was located at their own home.   I discussed the limitations of evaluation and management by telemedicine and the availability of in person appointments. The patient expressed understanding and agreed to proceed.   PATIENT: Anandi P Mirarchi DOB: [birth year]  REASON FOR VISIT: follow up HISTORY FROM: patient  HISTORY OF PRESENT ILLNESS: Today 01/28/20:  Ms. Drier is a 64 year old female with a history of OSA on Bipap. She returns today for follow-up. Her download indicates that she use her machine 29 out of 30 days for compliance of 97%.  She used her machine greater than 4 hours 20 days for compliance of 67%.  Her residual AHI is 1.4 on 18/14 centimeters of water.  Her leak in the 95th percentile is 32.1 L/min.  She reports that the CPAP continues to work well for her.  She is moving to [place].  She has a follow-up appointment with me in August which we will keep before she moves.  She does state that she will be traveling back and forth and will keep all of her physicians here in [place].   HISTORY 04/02/19:  Ms. Gainey is a 64 year old female with a history of obstructive sleep apnea on BiPAP.  She returns today for follow-up.  Her download indicates that she use her machine 26 out of 30 days for compliance of 87%.  She used her machine greater than 4 hours only 9 days for compliance of 30%.  She states that the air is extremely hot and she has a hard time keeping the mask on.  She also states that the machine is making a noise.  She has an appointment set up with her DME to have them look at this.  When she uses her machine  her AHI is 1.1.  She remains on a pressure of 18/14 with pressure support of 4.  She returns today for an evaluation.  REVIEW OF SYSTEMS: Out of a complete 14 system review of symptoms, the patient complains only of the following symptoms, and all other reviewed systems are negative.  See HPI  ALLERGIES: Allergies  Allergen Reactions  . Celebrex [Celecoxib] Swelling  . Lisinopril Cough  . Meloxicam Swelling  . Nsaids Swelling  . Penicillins Other (See Comments)    Drop in blood pressure  . Statins Other (See Comments)    Myalgias, edema Other reaction(s): Other (See Comments) edema Other reaction(s): Other (See Comments) Myalgias, edema  . Lidocaine Rash    "bad taste in mouth" with dental work    HOME MEDICATIONS: Outpatient Medications Prior to Visit  Medication Sig Dispense Refill  . Ascorbic Acid (VITAMIN C PO) Take by mouth daily.    . Continuous Blood Gluc Receiver (DEXCOM G5 RECEIVER KIT) DEVI by Does not apply route.    . diltiazem (CARDIZEM CD) 240 MG 24 hr capsule Take 1 capsule (240 mg total) by mouth daily. 90 capsule 3  . Exenatide ER (BYDUREON BCISE) 2 MG/0.85ML AUIJ Inject 2 mg into the skin once a week. 4 pen 3  . fenofibrate 160 MG tablet Take 1 tablet (160 mg total) by mouth daily.  90 tablet 3  . glipiZIDE (GLUCOTROL XL) 5 MG 24 hr tablet Take 1 tablet (5 mg total) by mouth 2 (two) times daily. 180 tablet 3  . losartan-hydrochlorothiazide (HYZAAR) 100-25 MG tablet Take 1 tablet by mouth daily. 90 tablet 3  . Melatonin 1 MG TABS Take 1 mg by mouth at bedtime.    . metFORMIN (GLUCOPHAGE-XR) 500 MG 24 hr tablet Take 4 tablets by mouth once a day 360 tablet 3  . Misc Natural Products (OSTEO BI-FLEX ADV JOINT SHIELD) TABS     . Multiple Vitamin (MULTIVITAMIN WITH MINERALS) TABS tablet Take 1 tablet by mouth daily.    Marland Kitchen omeprazole (PRILOSEC) 20 MG capsule TAKE 1 CAPSULE BY MOUTH  DAILY 30 TO 60 MINUTES  BEFORE BREAKFAST 90 capsule 3  . PARoxetine (PAXIL) 40 MG  tablet TAKE 1 TABLET(40 MG) BY MOUTH EVERY MORNING 90 tablet 3  . pravastatin (PRAVACHOL) 40 MG tablet Take 1 tablet (40 mg total) by mouth daily. 90 tablet 3  . rOPINIRole (REQUIP) 2 MG tablet Take 2 tablets at bedtime 180 tablet 3  . tobramycin-dexamethasone (TOBRADEX) ophthalmic solution Use 1 drop in left eye four times a day    . TOVIAZ 8 MG TB24 tablet TAKE 1 TABLET BY MOUTH  DAILY 90 tablet 3   No facility-administered medications prior to visit.    PAST MEDICAL HISTORY: Past Medical History:  Diagnosis Date  . Anxiety   . Arthritis    "was in my knees before OR; now in my hands" (05/05/2014)  . Bronchitis   . Chronic bronchitis ([place])    "used to get it q yr; not as much since septal repair in 2004"  . Depression   . Family history of bone cancer   . Family history of breast cancer   . Family history of carrier of hereditary disease   . Family history of CLL (chronic lymphoid leukemia)   . Family history of testicular cancer   . Fatty liver 06/30/2019  . GERD (gastroesophageal reflux disease)   . High triglycerides   . HSV infection   . Hyperlipidemia   . Hypertension   . Left knee DJD   . Obesity   . Primary localized osteoarthritis of right knee   . Restless leg syndrome   . Sleep apnea    mild; does not wear CPAP  . Type II diabetes mellitus ([place])   . Urgency of urination    and frequency takes Vesicare  . Urinary incontinence   . Walking pneumonia ~ 2013    PAST SURGICAL HISTORY: Past Surgical History:  Procedure Laterality Date  . COLONOSCOPY    . HYSTEROSCOPY  11/94   submuous myomas  . KNEE ARTHROSCOPY Right ~ 2010  . KNEE ARTHROSCOPY W/ MENISCECTOMY Right 2010  . NASAL SEPTUM SURGERY    . REFRACTIVE SURGERY Bilateral ~ 2005  . TONSILLECTOMY  ~ 2004  . TOTAL KNEE ARTHROPLASTY Left 08/11/2013   Procedure: LEFT TOTAL KNEE ARTHROPLASTY;  Surgeon: Lorn Junes, MD;  Location: [facility];  Service: Orthopedics;  Laterality: Left;  . TOTAL KNEE  ARTHROPLASTY Right 05/04/2014   Procedure: RIGHT TOTAL KNEE ARTHROPLASTY;  Surgeon: Lorn Junes, MD;  Location: [facility];  Service: Orthopedics;  Laterality: Right;  . UVULOPALATOPHARYNGOPLASTY, TONSILLECTOMY AND SEPTOPLASTY  2004  . VAGINAL HYSTERECTOMY  06/1993    FAMILY HISTORY: Family History  Problem Relation Age of Onset  . Heart disease Mother   . Hypertension Mother   . Stroke Mother   .  Breast cancer Mother 73  . Diabetes Mellitus II Father   . Cancer Father        CLL  . Hypertension Brother   . Testicular cancer Brother 76  . Breast cancer Paternal Grandmother        diagnosed 64s  . Breast cancer Maternal Grandmother        diagnosed 52s  . Breast cancer Paternal Aunt   . Bone cancer Maternal Grandfather 70  . Breast cancer Cousin        paternal first cousin, diagnosed 1s    SOCIAL HISTORY: Social History   Socioeconomic History  . Marital status: Widowed    Spouse name: Not on file  . Number of children: 2  . Years of education: 3  . Highest education level: Not on file  Occupational History  . Occupation: Intake Coordinator    Comment: administration  Tobacco Use  . Smoking status: Never Smoker  . Smokeless tobacco: Never Used  Vaping Use  . Vaping Use: Never used  Substance and Sexual Activity  . Alcohol use: Not Currently  . Drug use: No  . Sexual activity: Not Currently    Partners: Male    Birth control/protection: Surgical    Comment: TV[facility] 1994  Other Topics Concern  . Not on file  Social History Narrative   Currently single. Fun: Crafts, flea markets, antiquing, travel, reading   Denies religious beliefs that effect health care.    Caffeine: 2-3 drinks a day    Social Determinants of Health   Financial Resource Strain:   . Difficulty of Paying Living Expenses:   Food Insecurity:   . Worried About Programme researcher, broadcasting/film/video in the Last Year:   . Barista in the Last Year:   Transportation Needs:   . Freight forwarder  (Medical):   Marland Kitchen Lack of Transportation (Non-Medical):   Physical Activity:   . Days of Exercise per Week:   . Minutes of Exercise per Session:   Stress:   . Feeling of Stress :   Social Connections:   . Frequency of Communication with Friends and Family:   . Frequency of Social Gatherings with Friends and Family:   . Attends Religious Services:   . Active Member of Clubs or Organizations:   . Attends Banker Meetings:   Marland Kitchen Marital Status:   Intimate Partner Violence:   . Fear of Current or Ex-Partner:   . Emotionally Abused:   Marland Kitchen Physically Abused:   . Sexually Abused:       PHYSICAL EXAM Generalized: Well developed, in no acute distress   Neurological examination  Mentation: Alert oriented to time, place, history taking. Follows all commands speech and language fluent Cranial nerve II-XII:Extraocular movements were full. Facial symmetry noted. uvula tongue midline. Head turning and shoulder shrug  were normal and symmetric. Motor: Good strength throughout subjectively per patient Sensory: Sensory testing is intact to soft touch on all 4 extremities subjectively per patient Coordination: Cerebellar testing reveals good finger-nose-finger  Gait and station: Patient is able to stand from a seated position. gait is normal.  Reflexes: UTA  DIAGNOSTIC DATA (LABS, IMAGING, TESTING) - I reviewed patient records, labs, notes, testing and imaging myself where available.  Lab Results  Component Value Date   WBC 9.5 06/30/2019   HGB 13.5 06/30/2019   HCT 39.6 06/30/2019   MCV 85.2 06/30/2019   PLT 224.0 06/30/2019      Component Value Date/Time   NA  136 06/30/2019 1019   K 4.1 06/30/2019 1019   CL 98 06/30/2019 1019   CO2 28 06/30/2019 1019   GLUCOSE 149 (H) 06/30/2019 1019   BUN 22 06/30/2019 1019   CREATININE 0.56 06/30/2019 1019   CALCIUM 9.5 06/30/2019 1019   PROT 7.2 06/30/2019 1019   ALBUMIN 4.5 06/30/2019 1019   AST 34 06/30/2019 1019   ALT 45 (H)  06/30/2019 1019   ALKPHOS 100 06/30/2019 1019   BILITOT 0.4 06/30/2019 1019   GFRNONAA >60 12/04/2015 1417   GFRAA >60 12/04/2015 1417   Lab Results  Component Value Date   CHOL 150 06/30/2019   HDL 39.60 06/30/2019   LDLCALC 101 (H) 06/22/2017   LDLDIRECT 85.0 06/30/2019   TRIG 206.0 (H) 06/30/2019   CHOLHDL 4 06/30/2019   Lab Results  Component Value Date   HGBA1C 6.7 (A) 10/21/2019   Lab Results  Component Value Date   VITAMINB12 248 06/30/2019   Lab Results  Component Value Date   TSH 1.84 06/30/2019      ASSESSMENT AND PLAN 64 y.o. year old female  has a past medical history of Anxiety, Arthritis, Bronchitis, Chronic bronchitis ([place]), Depression, Family history of bone cancer, Family history of breast cancer, Family history of carrier of hereditary disease, Family history of CLL (chronic lymphoid leukemia), Family history of testicular cancer, Fatty liver (06/30/2019), GERD (gastroesophageal reflux disease), High triglycerides, HSV infection, Hyperlipidemia, Hypertension, Left knee DJD, Obesity, Primary localized osteoarthritis of right knee, Restless leg syndrome, Sleep apnea, Type II diabetes mellitus ([place]), Urgency of urination, Urinary incontinence, and Walking pneumonia (~ 2013). here with:  OSA on CPAP  . CPAP compliance excellent . Residual AHI is good . Encouraged patient to continue using CPAP nightly and > 4 hours each night . F/U in 1 year or sooner if needed  I spent 20 minutes of face-to-face and non-face-to-face time with patient.  This included previsit chart review, lab review, study review, order entry, electronic health record documentation, patient education.  Ward Givens, MSN, NP-C 01/28/2020, 1:40 PM [facility] [address], [place], [place] [address] [phone]

## 2020-02-03 LAB — HM DIABETES EYE EXAM

## 2020-02-13 ENCOUNTER — Encounter: Payer: Self-pay | Admitting: Internal Medicine

## 2020-03-30 ENCOUNTER — Ambulatory Visit
Admission: RE | Admit: 2020-03-30 | Discharge: 2020-03-30 | Disposition: A | Discharge status: Home / Self Care | Payer: Medicare Other | Source: Ambulatory Visit | Attending: Internal Medicine | Admitting: Internal Medicine

## 2020-03-30 ENCOUNTER — Other Ambulatory Visit: Payer: Self-pay

## 2020-03-30 DIAGNOSIS — Z1231 Encounter for screening mammogram for malignant neoplasm of breast: Secondary | ICD-10-CM

## 2020-04-04 ENCOUNTER — Encounter: Payer: Self-pay | Admitting: Adult Health

## 2020-04-05 ENCOUNTER — Encounter: Payer: Self-pay | Admitting: Internal Medicine

## 2020-04-05 ENCOUNTER — Other Ambulatory Visit: Payer: Self-pay

## 2020-04-05 ENCOUNTER — Ambulatory Visit (INDEPENDENT_AMBULATORY_CARE_PROVIDER_SITE_OTHER): Payer: Medicare Other | Admitting: Internal Medicine

## 2020-04-05 VITALS — BP 140/62 | HR 81 | Ht 64.0 in | Wt 251.0 lb

## 2020-04-05 DIAGNOSIS — E1165 Type 2 diabetes mellitus with hyperglycemia: Secondary | ICD-10-CM | POA: Diagnosis not present

## 2020-04-05 DIAGNOSIS — E1142 Type 2 diabetes mellitus with diabetic polyneuropathy: Secondary | ICD-10-CM | POA: Diagnosis not present

## 2020-04-05 LAB — POCT GLYCOSYLATED HEMOGLOBIN (HGB A1C): Hemoglobin A1C: 7.4 % — AB (ref 4.0–5.6)

## 2020-04-05 MED ORDER — DEXCOM G6 SENSOR MISC: 1.0000 | 6 refills | Status: DC

## 2020-04-05 MED ORDER — DEXCOM G6 TRANSMITTER MISC: 1.0000 | 3 refills | Status: DC

## 2020-04-05 MED ORDER — GLIPIZIDE 5 MG PO TABS
7.5000 mg | ORAL_TABLET | Freq: Two times a day (BID) | ORAL | 3 refills | Status: DC
Start: 2020-04-05 — End: 2020-04-27

## 2020-04-05 NOTE — Patient Instructions (Addendum)
-   Will change Glipizide to regular release, take one and a half tablet before Breakfast and Supper  - Continue Metformin XR 500 at 2 tablets with meals Twice a day  - Continue Bydureon 2 mg  weekly     -HOW TO TREAT LOW BLOOD SUGARS (Blood sugar LESS THAN 70 MG/DL)  Please follow the RULE OF 15 for the treatment of hypoglycemia treatment (when your (blood sugars are less than 70 mg/dL)    STEP 1: Take 15 grams of carbohydrates when your blood sugar is low, which includes:   3-4 GLUCOSE TABS  OR  3-4 OZ OF JUICE OR REGULAR SODA OR  ONE TUBE OF GLUCOSE GEL     STEP 2: RECHECK blood sugar in 15 MINUTES STEP 3: If your blood sugar is still low at the 15 minute recheck --> then, go back to STEP 1 and treat AGAIN with another 15 grams of carbohydrates.

## 2020-04-05 NOTE — Progress Notes (Signed)
Name: Diane Gregory  Age/ Sex: 64 y.o., female   MRN/ DOB: [id number], Sep 14, 1955     PCP: Biagio Borg, MD   Reason for Endocrinology Evaluation: Type 2 Diabetes Mellitus  Initial Endocrine Consultative Visit: 08/02/18    PATIENT IDENTIFIER: [name] is a 64 y.o. female with a past medical history of HTN, OSA, T2DM and DJD . The patient has followed with Endocrinology clinic since 08/02/18 for consultative assistance with management of her diabetes.  DIABETIC HISTORY:  Diane Gregory was diagnosed with T2DM in 2009, she has been on oral glycemic agents since her diagnosis, on her initial presentation to our clinic she was on Bydureon 2 mg weekly, glipizide ER 7-1/2 mg with breakfast, metformin XR 500 mg 2 tablets with breakfast. Her hemoglobin A1c has ranged from 6.5%  In 12/2017 , peaking at 8.3% in 2017  SUBJECTIVE:   During the last visit (10/21/2019): Her A1c was 6.7% .  We continued Glipizide, Bydureon 2 mg weekly, and  Metformin.    Today (04/05/2020): Diane Gregory is here for a 3 month follow-up on her diabetes management. She checks her blood sugars before meals and at bedtime daily, through her CGM. The patient has not had hypoglycemic episodes since the last clinic visit. She continues to snack at bedtime sometimes its cereal and at times its cheese.    She is c/o left foot sx for spurs,   HOME DIABETES REGIMEN:   Glipizide 5 mg BID  Metformin XR 500 mg Two tabs BID  Bydureon at 2 mg weekly      CONTINUOUS GLUCOSE MONITORING RECORD INTERPRETATION    Dates of Recording:8/10-8/23/2021  Sensor description: Dexcom   Results statistics:   CGM use % of time 93  Average and SD 230/49  Time in range   15%  % Time Above 180 55  % Time above 250 30  % Time Below target 0    Glycemic patterns summary: she was noted with hyperglycemia all day and night  Hypoglycemic episodes occurred none  Overnight periods: high     DIABETIC  COMPLICATIONS: Microvascular complications:    Denies: neuropathy, nephropathy, retinopathy   Last eye exam: Completed 02/03/2020  Macrovascular complications:    Denies: CAD, PVD, CVA   HISTORY:  Past Medical History:  Past Medical History:  Diagnosis Date  . Anxiety   . Arthritis    "was in my knees before OR; now in my hands" (05/05/2014)  . Bronchitis   . Chronic bronchitis ([place])    "used to get it q yr; not as much since septal repair in 2004"  . Depression   . Family history of bone cancer   . Family history of breast cancer   . Family history of carrier of hereditary disease   . Family history of CLL (chronic lymphoid leukemia)   . Family history of testicular cancer   . Fatty liver 06/30/2019  . GERD (gastroesophageal reflux disease)   . High triglycerides   . HSV infection   . Hyperlipidemia   . Hypertension   . Left knee DJD   . Obesity   . Primary localized osteoarthritis of right knee   . Restless leg syndrome   . Sleep apnea    mild; does not wear CPAP  . Type II diabetes mellitus ([place])   . Urgency of urination    and frequency takes Vesicare  . Urinary incontinence   . Walking pneumonia ~ 2013  Past Surgical History:  Past Surgical History:  Procedure Laterality Date  . BREAST BIOPSY Left   . COLONOSCOPY    . HYSTEROSCOPY  11/94   submuous myomas  . KNEE ARTHROSCOPY Right ~ 2010  . KNEE ARTHROSCOPY W/ MENISCECTOMY Right 2010  . NASAL SEPTUM SURGERY    . REFRACTIVE SURGERY Bilateral ~ 2005  . TONSILLECTOMY  ~ 2004  . TOTAL KNEE ARTHROPLASTY Left 08/11/2013   Procedure: LEFT TOTAL KNEE ARTHROPLASTY;  Surgeon: Robert A Wainer, MD;  [facility];  Service: Orthopedics;  Laterality: Left;  . TOTAL KNEE ARTHROPLASTY Right 05/04/2014   Procedure: RIGHT TOTAL KNEE ARTHROPLASTY;  Surgeon: Robert A Wainer, MD;  Location: [facility];  Service: Orthopedics;  Laterality: Right;  . UVULOPALATOPHARYNGOPLASTY, TONSILLECTOMY AND SEPTOPLASTY  2004  .  VAGINAL HYSTERECTOMY  06/1993    Social History:  reports that she has never smoked. She has never used smokeless tobacco. She reports previous alcohol use. She reports that she does not use drugs. Family History:  Family History  Problem Relation Age of Onset  . Heart disease Mother   . Hypertension Mother   . Stroke Mother   . Breast cancer Mother 72  . Diabetes Mellitus II Father   . Cancer Father        CLL  . Hypertension Brother   . Testicular cancer Brother 24  . Breast cancer Paternal Grandmother        diagnosed 50s  . Breast cancer Maternal Grandmother        diagnosed 70s  . Breast cancer Paternal Aunt   . Bone cancer Maternal Grandfather 70  . Breast cancer Cousin        paternal first cousin, diagnosed 50s     HOME MEDICATIONS: Allergies as of 04/05/2020      Reactions   Celebrex [celecoxib] Swelling   Lisinopril Cough   Meloxicam Swelling   Nsaids Swelling   Penicillins Other (See Comments)   Drop in blood pressure   Statins Other (See Comments)   Myalgias, edema Other reaction(s): Other (See Comments) edema Other reaction(s): Other (See Comments) Myalgias, edema   Lidocaine Rash   "bad taste in mouth" with dental work      Medication List       Accurate as of April 05, 2020  1:09 PM. If you have any questions, ask your nurse or doctor.        Bydureon BCise 2 MG/0.85ML Auij Generic drug: Exenatide ER Inject 2 mg into the skin once a week.   Dexcom G5 Receiver Kit Devi by Does not apply route.   diltiazem 240 MG 24 hr capsule Commonly known as: CARDIZEM CD Take 1 capsule (240 mg total) by mouth daily.   fenofibrate 160 MG tablet Take 1 tablet (160 mg total) by mouth daily.   glipiZIDE 5 MG 24 hr tablet Commonly known as: GLUCOTROL XL Take 1 tablet (5 mg total) by mouth 2 (two) times daily.   losartan-hydrochlorothiazide 100-25 MG tablet Commonly known as: HYZAAR Take 1 tablet by mouth daily.   melatonin 1 MG Tabs tablet Take 1  mg by mouth at bedtime.   metFORMIN 500 MG 24 hr tablet Commonly known as: GLUCOPHAGE-XR Take 4 tablets by mouth once a day   multivitamin with minerals Tabs tablet Take 1 tablet by mouth daily.   omeprazole 20 MG capsule Commonly known as: PRILOSEC TAKE 1 CAPSULE BY MOUTH  DAILY 30 TO 60 MINUTES  BEFORE BREAKFAST   Osteo Bi-Flex   Adv Joint Shield Tabs   PARoxetine 40 MG tablet Commonly known as: PAXIL TAKE 1 TABLET(40 MG) BY MOUTH EVERY MORNING   pravastatin 40 MG tablet Commonly known as: PRAVACHOL Take 1 tablet (40 mg total) by mouth daily.   rOPINIRole 2 MG tablet Commonly known as: REQUIP Take 2 tablets at bedtime   tobramycin-dexamethasone ophthalmic solution Commonly known as: TOBRADEX Use 1 drop in left eye four times a day   Toviaz 8 MG Tb24 tablet Generic drug: fesoterodine TAKE 1 TABLET BY MOUTH  DAILY   VITAMIN C PO Take by mouth daily.      PHYSICAL EXAM: VS: LMP 08/14/1994 (Within Months)    EXAM: General: Pt appears well and is in NAD  Lungs: Clear with good BS bilat with no rales, rhonchi, or wheezes  Heart: Auscultation: RRR with normal S1 and S2  Extremities: BL LE: Trace pretibial edema  Mental Status: Judgment, insight: intact Orientation: oriented to time, place, and person Mood and affect: no depression, anxiety, or agitation    Foot DM exma 04/05/2020 The skin of the feet is intact without sores or ulcerations. The pedal pulses are 2+ on right and 2+ on left. The sensation is decreased over the great toes to a screening 5.07, 10 gram monofilament bilaterally   DATA REVIEWED:  Lab Results  Component Value Date   HGBA1C 6.7 (A) 10/21/2019   HGBA1C 6.2 (A) 06/19/2019   HGBA1C 6.9 (A) 03/18/2019    Lab Results  Component Value Date   CHOL 150 06/30/2019   HDL 39.60 06/30/2019   LDLCALC 101 (H) 06/22/2017   LDLDIRECT 85.0 06/30/2019   TRIG 206.0 (H) 06/30/2019   CHOLHDL 4 06/30/2019         ASSESSMENT / PLAN /  RECOMMENDATIONS:   1)Type 2 Diabetes Mellitus, Sub- Optimally Controlled, With Neuropathic complications - Most recent A1c of  7.4 %. Goal A1c < 7.0 %.     - Slight worsening of A1c. Pt attributes this to her left foot surgery and being sedentary.  - Her CGM download shows hyperglycemia with an average equivalent to 8.8 % Will adjust medications as below  - Medicare will not cover CGM, pt requested freestyle libre     MEDICATIONS: - Continue Metformin XR 500 at 2 tablets with meals Twice a day  - Switch Glipizide XL to regular release 5 mg, and increase to 1.5 tabs BID  - Continue Bydureon 2 mg weekly   EDUCATION / INSTRUCTIONS:  BG monitoring instructions: Patient is instructed to check her blood sugars 2 times a day, fasting and Supper/bedtime   Call [facility] if: BG persistently < 70 . I reviewed the Rule of 15 for the treatment of hypoglycemia in detail with the patient. Literature supplied.   2) Diabetic complications:   Eye: Does not have known diabetic retinopathy.   Neuro/ Feet: Does have known diabetic peripheral neuropathy.based on exam   Renal: Patient does not have known baseline CKD. She is  on an ACEI/ARB at present.    F/U in 6 months    Signed electronically by: Abby Jaralla Shamleffer, MD  [facility]  [facility] [address] [place], [place] [address] Phone: [phone] FAX: [phone]   CC: John, James W, MD [address] [place] [address] Phone: [phone]  Fax: [phone]  Return to Endocrinology clinic as below: Future Appointments  Date Time Provider Department Center  04/06/2020 11:30 AM Millikan, Megan, NP [facility] None    04/29/2020 10:30 AM [facility] MR 1 [id number]  06/30/2020  9:00 AM Biagio Borg, MD [facility] None

## 2020-04-06 ENCOUNTER — Other Ambulatory Visit: Payer: Self-pay

## 2020-04-06 ENCOUNTER — Ambulatory Visit (INDEPENDENT_AMBULATORY_CARE_PROVIDER_SITE_OTHER): Payer: Medicare Other | Admitting: Adult Health

## 2020-04-06 VITALS — BP 138/72 | HR 86 | Ht 63.75 in | Wt 251.0 lb

## 2020-04-06 DIAGNOSIS — G4733 Obstructive sleep apnea (adult) (pediatric): Secondary | ICD-10-CM

## 2020-04-06 MED ORDER — FREESTYLE LIBRE 2 READER DEVI: 1.0000 | 0 refills | Status: AC

## 2020-04-06 MED ORDER — DEXCOM G6 TRANSMITTER MISC: 1.0000 | 0 refills | Status: DC

## 2020-04-06 MED ORDER — DEXCOM G6 SENSOR MISC: 1.0000 | 0 refills | Status: DC

## 2020-04-06 MED ORDER — FREESTYLE LIBRE 2 SENSOR MISC: 1.0000 | 3 refills | Status: AC

## 2020-04-06 NOTE — Progress Notes (Signed)
PATIENT: Diane Gregory DOB: [birth year]  REASON FOR VISIT: follow up HISTORY FROM: patient  HISTORY OF PRESENT ILLNESS: Today 04/06/20:  Diane Gregory is a 64 year old female with a history of obstructive sleep apnea on BiPAP.  She returns today for follow-up.  Her download indicates that she used her machine 28 out of 30 days for compliance of 93%.  She used her machine greater than 4 hours 25 days for compliance of 83%.  Her residual AHI is 1.1 on 18/14 centimeters of water.  She reports that the BiPAP is working well for her.  HISTORY 01/28/20:  Diane Gregory is a 64 year old female with a history of OSA on Bipap. She returns today for follow-up. Her download indicates that she use her machine 29 out of 30 days for compliance of 97%.  She used her machine greater than 4 hours 20 days for compliance of 67%.  Her residual AHI is 1.4 on 18/14 centimeters of water.  Her leak in the 95th percentile is 32.1 L/min.  She reports that the CPAP continues to work well for her.  She is moving to [place].  She has a follow-up appointment with me in August which we will keep before she moves.  She does state that she will be traveling back and forth and will keep all of her physicians here in [place].  REVIEW OF SYSTEMS: Out of a complete 14 system review of symptoms, the patient complains only of the following symptoms, and all other reviewed systems are negative.  FSS 19 ESS 8  ALLERGIES: Allergies  Allergen Reactions  . Celebrex [Celecoxib] Swelling  . Lisinopril Cough  . Meloxicam Swelling  . Nsaids Swelling  . Penicillins Other (See Comments)    Drop in blood pressure  . Statins Other (See Comments)    Myalgias, edema Other reaction(s): Other (See Comments) edema Other reaction(s): Other (See Comments) Myalgias, edema  . Lidocaine Rash    "bad taste in mouth" with dental work    HOME MEDICATIONS: Outpatient Medications Prior to Visit  Medication Sig Dispense Refill  .  Ascorbic Acid (VITAMIN C PO) Take by mouth daily.    . Continuous Blood Gluc Receiver (FREESTYLE LIBRE 2 READER) DEVI 1 Device by Does not apply route as directed. 1 each 0  . Continuous Blood Gluc Sensor (FREESTYLE LIBRE 2 SENSOR) MISC 1 Device by Does not apply route as directed. 6 each 3  . diltiazem (CARDIZEM CD) 240 MG 24 hr capsule Take 1 capsule (240 mg total) by mouth daily. 90 capsule 3  . Exenatide ER (BYDUREON BCISE) 2 MG/0.85ML AUIJ Inject 2 mg into the skin once a week. 4 pen 3  . fenofibrate 160 MG tablet Take 1 tablet (160 mg total) by mouth daily. 90 tablet 3  . glipiZIDE (GLUCOTROL) 5 MG tablet Take 1.5 tablets (7.5 mg total) by mouth 2 (two) times daily before a meal. 225 tablet 3  . losartan-hydrochlorothiazide (HYZAAR) 100-25 MG tablet Take 1 tablet by mouth daily. 90 tablet 3  . Melatonin 1 MG TABS Take 1 mg by mouth at bedtime.    . metFORMIN (GLUCOPHAGE-XR) 500 MG 24 hr tablet Take 4 tablets by mouth once a day (Patient taking differently: Take 2 tablets by mouth twice daily.) 360 tablet 3  . Misc Natural Products (OSTEO BI-FLEX ADV JOINT SHIELD) TABS     . Multiple Vitamin (MULTIVITAMIN WITH MINERALS) TABS tablet Take 1 tablet by mouth daily.    Marland Kitchen omeprazole (  PRILOSEC) 20 MG capsule TAKE 1 CAPSULE BY MOUTH  DAILY 30 TO 60 MINUTES  BEFORE BREAKFAST 90 capsule 3  . PARoxetine (PAXIL) 40 MG tablet TAKE 1 TABLET(40 MG) BY MOUTH EVERY MORNING 90 tablet 3  . pravastatin (PRAVACHOL) 40 MG tablet Take 1 tablet (40 mg total) by mouth daily. 90 tablet 3  . rOPINIRole (REQUIP) 2 MG tablet Take 2 tablets at bedtime 180 tablet 3   No facility-administered medications prior to visit.    PAST MEDICAL HISTORY: Past Medical History:  Diagnosis Date  . Anxiety   . Arthritis    "was in my knees before OR; now in my hands" (05/05/2014)  . Bronchitis   . Chronic bronchitis ([place])    "used to get it q yr; not as much since septal repair in 2004"  . Depression   . Family history of bone  cancer   . Family history of breast cancer   . Family history of carrier of hereditary disease   . Family history of CLL (chronic lymphoid leukemia)   . Family history of testicular cancer   . Fatty liver 06/30/2019  . GERD (gastroesophageal reflux disease)   . High triglycerides   . HSV infection   . Hyperlipidemia   . Hypertension   . Left knee DJD   . Obesity   . Primary localized osteoarthritis of right knee   . Restless leg syndrome   . Sleep apnea    mild; does not wear CPAP  . Type II diabetes mellitus ([place])   . Urgency of urination    and frequency takes Vesicare  . Urinary incontinence   . Walking pneumonia ~ 2013    PAST SURGICAL HISTORY: Past Surgical History:  Procedure Laterality Date  . BREAST BIOPSY Left   . COLONOSCOPY    . HYSTEROSCOPY  11/94   submuous myomas  . KNEE ARTHROSCOPY Right ~ 2010  . KNEE ARTHROSCOPY W/ MENISCECTOMY Right 2010  . NASAL SEPTUM SURGERY    . REFRACTIVE SURGERY Bilateral ~ 2005  . TONSILLECTOMY  ~ 2004  . TOTAL KNEE ARTHROPLASTY Left 08/11/2013   Procedure: LEFT TOTAL KNEE ARTHROPLASTY;  Surgeon: Lorn Junes, MD;  Location: [facility];  Service: Orthopedics;  Laterality: Left;  . TOTAL KNEE ARTHROPLASTY Right 05/04/2014   Procedure: RIGHT TOTAL KNEE ARTHROPLASTY;  Surgeon: Lorn Junes, MD;  Location: [facility];  Service: Orthopedics;  Laterality: Right;  . UVULOPALATOPHARYNGOPLASTY, TONSILLECTOMY AND SEPTOPLASTY  2004  . VAGINAL HYSTERECTOMY  06/1993    FAMILY HISTORY: Family History  Problem Relation Age of Onset  . Heart disease Mother   . Hypertension Mother   . Stroke Mother   . Breast cancer Mother 89  . Diabetes Mellitus II Father   . Cancer Father        CLL  . Hypertension Brother   . Testicular cancer Brother 43  . Breast cancer Paternal Grandmother        diagnosed 28s  . Breast cancer Maternal Grandmother        diagnosed 77s  . Breast cancer Paternal Aunt   . Bone cancer Maternal Grandfather 70  . Breast  cancer Cousin        paternal first cousin, diagnosed 32s    SOCIAL HISTORY: Social History   Socioeconomic History  . Marital status: Widowed    Spouse name: Not on file  . Number of children: 2  . Years of education: 27  . Highest education level: Not on file  Occupational History  . Occupation: Intake Coordinator    Comment: administration  Tobacco Use  . Smoking status: Never Smoker  . Smokeless tobacco: Never Used  Vaping Use  . Vaping Use: Never used  Substance and Sexual Activity  . Alcohol use: Not Currently  . Drug use: No  . Sexual activity: Not Currently    Partners: Male    Birth control/protection: Surgical    Comment: [facility] 1994  Other Topics Concern  . Not on file  Social History Narrative   Currently single. Fun: Crafts, flea markets, antiquing, travel, reading   Denies religious beliefs that effect health care.    Caffeine: 2-3 drinks a day    Social Determinants of Health   Financial Resource Strain:   . Difficulty of Paying Living Expenses: Not on file  Food Insecurity:   . Worried About Charity fundraiser in the Last Year: Not on file  . Ran Out of Food in the Last Year: Not on file  Transportation Needs:   . Lack of Transportation (Medical): Not on file  . Lack of Transportation (Non-Medical): Not on file  Physical Activity:   . Days of Exercise per Week: Not on file  . Minutes of Exercise per Session: Not on file  Stress:   . Feeling of Stress : Not on file  Social Connections:   . Frequency of Communication with Friends and Family: Not on file  . Frequency of Social Gatherings with Friends and Family: Not on file  . Attends Religious Services: Not on file  . Active Member of Clubs or Organizations: Not on file  . Attends Archivist Meetings: Not on file  . Marital Status: Not on file  Intimate Partner Violence:   . Fear of Current or Ex-Partner: Not on file  . Emotionally Abused: Not on file  . Physically Abused: Not on file    . Sexually Abused: Not on file      PHYSICAL EXAM  Vitals:   04/06/20 1109  BP: 138/72  Pulse: 86  Weight: 251 lb (113.9 kg)  Height: 5' 3.75" (1.619 m)   Body mass index is 43.42 kg/m.  Generalized: Well developed, in no acute distress  Chest: Lungs clear to auscultation bilaterally  Neurological examination  Mentation: Alert oriented to time, place, history taking. Follows all commands speech and language fluent Cranial nerve II-XII: Extraocular movements were full, visual field were full on confrontational test Head turning and shoulder shrug  were normal and symmetric. Motor: The motor testing reveals 5 over 5 strength of all 4 extremities. Good symmetric motor tone is noted throughout.  Sensory: Sensory testing is intact to soft touch on all 4 extremities. No evidence of extinction is noted.  Gait and station: Gait is normal.    DIAGNOSTIC DATA (LABS, IMAGING, TESTING) - I reviewed patient records, labs, notes, testing and imaging myself where available.  Lab Results  Component Value Date   WBC 9.5 06/30/2019   HGB 13.5 06/30/2019   HCT 39.6 06/30/2019   MCV 85.2 06/30/2019   PLT 224.0 06/30/2019      Component Value Date/Time   NA 136 06/30/2019 1019   K 4.1 06/30/2019 1019   CL 98 06/30/2019 1019   CO2 28 06/30/2019 1019   GLUCOSE 149 (H) 06/30/2019 1019   BUN 22 06/30/2019 1019   CREATININE 0.56 06/30/2019 1019   CALCIUM 9.5 06/30/2019 1019   PROT 7.2 06/30/2019 1019   ALBUMIN 4.5 06/30/2019 1019   AST  34 06/30/2019 1019   ALT 45 (H) 06/30/2019 1019   ALKPHOS 100 06/30/2019 1019   BILITOT 0.4 06/30/2019 1019   GFRNONAA >60 12/04/2015 1417   GFRAA >60 12/04/2015 1417   Lab Results  Component Value Date   CHOL 150 06/30/2019   HDL 39.60 06/30/2019   LDLCALC 101 (H) 06/22/2017   LDLDIRECT 85.0 06/30/2019   TRIG 206.0 (H) 06/30/2019   CHOLHDL 4 06/30/2019   Lab Results  Component Value Date   HGBA1C 7.4 (A) 04/05/2020   Lab Results   Component Value Date   VITAMINB12 248 06/30/2019   Lab Results  Component Value Date   TSH 1.84 06/30/2019      ASSESSMENT AND PLAN 64 y.o. year old female  has a past medical history of Anxiety, Arthritis, Bronchitis, Chronic bronchitis ([place]), Depression, Family history of bone cancer, Family history of breast cancer, Family history of carrier of hereditary disease, Family history of CLL (chronic lymphoid leukemia), Family history of testicular cancer, Fatty liver (06/30/2019), GERD (gastroesophageal reflux disease), High triglycerides, HSV infection, Hyperlipidemia, Hypertension, Left knee DJD, Obesity, Primary localized osteoarthritis of right knee, Restless leg syndrome, Sleep apnea, Type II diabetes mellitus ([place]), Urgency of urination, Urinary incontinence, and Walking pneumonia (~ 2013). here with:  1. OSA on CPAP  - CPAP compliance excellent - Good treatment of AHI  - Encourage patient to use CPAP nightly and > 4 hours each night - F/U in 1 year or sooner if needed   I spent 20 minutes of face-to-face and non-face-to-face time with patient.  This included previsit chart review, lab review, study review, order entry, electronic health record documentation, patient education.  Ward Givens, MSN, NP-C 04/06/2020, 11:36 AM [facility] [address] [place], [place] [address] [phone]

## 2020-04-08 ENCOUNTER — Telehealth: Payer: Self-pay | Admitting: Internal Medicine

## 2020-04-08 NOTE — Telephone Encounter (Signed)
Pharmacy called stating another Dr (Dr Cathlean Cower) prescribed patient the glipizide XL but Dr Kelton Pillar prescribed the regular glipizide and asked if patient should be on the XL or the regular? Based on Dr [facility] chart notes she discontinued the XL and Ammie confirmed with the pharmacist Thayer Jew) that yes patient needs to be on the regular - not the XL.

## 2020-04-12 ENCOUNTER — Other Ambulatory Visit: Payer: Self-pay

## 2020-04-12 ENCOUNTER — Encounter: Payer: Self-pay | Admitting: Obstetrics and Gynecology

## 2020-04-12 ENCOUNTER — Ambulatory Visit (INDEPENDENT_AMBULATORY_CARE_PROVIDER_SITE_OTHER): Payer: Medicare Other | Admitting: Obstetrics and Gynecology

## 2020-04-12 VITALS — BP 132/64 | HR 80 | Ht 63.5 in | Wt 253.0 lb

## 2020-04-12 DIAGNOSIS — N764 Abscess of vulva: Secondary | ICD-10-CM | POA: Diagnosis not present

## 2020-04-12 MED ORDER — DOXYCYCLINE HYCLATE 100 MG PO CAPS: 100.0000 mg | ORAL_CAPSULE | Freq: Two times a day (BID) | ORAL | 0 refills | Status: AC

## 2020-04-12 NOTE — Patient Instructions (Signed)
Skin Abscess  A skin abscess is an infected area on or under your skin that contains a collection of pus and other material. An abscess may also be called a furuncle, carbuncle, or boil. An abscess can occur in or on almost any part of your body. Some abscesses break open (rupture) on their own. Most continue to get worse unless they are treated. The infection can spread deeper into the body and eventually into your blood, which can make you feel ill. Treatment usually involves draining the abscess. What are the causes? An abscess occurs when germs, like bacteria, pass through your skin and cause an infection. This may be caused by:  A scrape or cut on your skin.  A puncture wound through your skin, including a needle injection or insect bite.  Blocked oil or sweat glands.  Blocked and infected hair follicles.  A cyst that forms beneath your skin (sebaceous cyst) and becomes infected. What increases the risk? This condition is more likely to develop in people who:  Have a weak body defense system (immune system).  Have diabetes.  Have dry and irritated skin.  Get frequent injections or use illegal IV drugs.  Have a foreign body in a wound, such as a splinter.  Have problems with their lymph system or veins. What are the signs or symptoms? Symptoms of this condition include:  A painful, firm bump under the skin.  A bump with pus at the top. This may break through the skin and drain. Other symptoms include:  Redness surrounding the abscess site.  Warmth.  Swelling of the lymph nodes (glands) near the abscess.  Tenderness.  A sore on the skin. How is this diagnosed? This condition may be diagnosed based on:  A physical exam.  Your medical history.  A sample of pus. This may be used to find out what is causing the infection.  Blood tests.  Imaging tests, such as an ultrasound, CT scan, or MRI. How is this treated? A small abscess that drains on its own may  not need treatment. Treatment for larger abscesses may include:  Moist heat or heat pack applied to the area several times a day.  A procedure to drain the abscess (incision and drainage).  Antibiotic medicines. For a severe abscess, you may first get antibiotics through an IV and then change to antibiotics by mouth. Follow these instructions at home: Medicines   Take over-the-counter and prescription medicines only as told by your health care provider.  If you were prescribed an antibiotic medicine, take it as told by your health care provider. Do not stop taking the antibiotic even if you start to feel better. Abscess care   If you have an abscess that has not drained, apply heat to the affected area. Use the heat source that your health care provider recommends, such as a moist heat pack or a heating pad. ? Place a towel between your skin and the heat source. ? Leave the heat on for 20-30 minutes. ? Remove the heat if your skin turns bright red. This is especially important if you are unable to feel pain, heat, or cold. You may have a greater risk of getting burned.  Follow instructions from your health care provider about how to take care of your abscess. Make sure you: ? Cover the abscess with a bandage (dressing). ? Change your dressing or gauze as told by your health care provider. ? Wash your hands with soap and water before you change the   dressing or gauze. If soap and water are not available, use hand sanitizer.  Check your abscess every day for signs of a worsening infection. Check for: ? More redness, swelling, or pain. ? More fluid or blood. ? Warmth. ? More pus or a bad smell. General instructions  To avoid spreading the infection: ? Do not share personal care items, towels, or hot tubs with others. ? Avoid making skin contact with other people.  Keep all follow-up visits as told by your health care provider. This is important. Contact a health care provider if  you have:  More redness, swelling, or pain around your abscess.  More fluid or blood coming from your abscess.  Warm skin around your abscess.  More pus or a bad smell coming from your abscess.  A fever.  Muscle aches.  Chills or a general ill feeling. Get help right away if you:  Have severe pain.  See red streaks on your skin spreading away from the abscess. Summary  A skin abscess is an infected area on or under your skin that contains a collection of pus and other material.  A small abscess that drains on its own may not need treatment.  Treatment for larger abscesses may include having a procedure to drain the abscess and taking an antibiotic. This information is not intended to replace advice given to you by your health care provider. Make sure you discuss any questions you have with your health care provider. Document Revised: 11/21/2018 Document Reviewed: 09/13/2017 Elsevier Patient Education  2020 [employer]  

## 2020-04-12 NOTE — Progress Notes (Signed)
GYNECOLOGY  VISIT   HPI: 64 y.o.   Widowed  Caucasian  female   G2P2002 with Patient's last menstrual period was 08/14/1994 (within months).   here for right vulvar abscess.  Started filling up a 2 -3 weeks ago.  Worsened during the weekend.  Painful.  Not draining.  No fevers.   Had achilles tendon surgery.   Moving to [place] in 3 weeks.   GYNECOLOGIC HISTORY: Patient's last menstrual period was 08/14/1994 (within months). Contraception:  Hyst Menopausal hormone therapy:  none Last mammogram: 03-30-20 3D/Neg/density A/BiRads1 Last pap smear: 2000 Neg        OB History    Gravida  2   Para  2   Term  2   Preterm      AB      Living  2     SAB      TAB      Ectopic      Multiple      Live Births                 Patient Active Problem List   Diagnosis Date Noted  . Type 2 diabetes mellitus with diabetic polyneuropathy, without long-term current use of insulin ([place]) 10/21/2019  . Fatty liver 06/30/2019  . Genetic testing 04/29/2019  . Family history of breast cancer   . Family history of CLL (chronic lymphoid leukemia)   . Family history of bone cancer   . Family history of testicular cancer   . Family history of carrier of hereditary disease   . Achilles tendon pain 03/18/2019  . Type 2 diabetes mellitus with hyperglycemia, without long-term current use of insulin ([place]) 08/02/2018  . At high risk for breast cancer 01/11/2018  . Wellness examination 06/22/2017  . History of anemia 06/22/2017  . De Quervain's tenosynovitis, right 03/01/2016  . Overactive bladder 01/24/2016  . Severe obesity (BMI >= 40) ([place]) 07/05/2015  . Anxiety and depression 11/18/2014  . S/P left total knee replacement   . Right knee DJD   . Primary localized osteoarthritis of right knee   . DJD (degenerative joint disease) of knee 08/11/2013  . Diabetes mellitus, type 2 ([place])   . Hypertension   . Restless leg syndrome   . GERD (gastroesophageal reflux disease)   .  High triglycerides   . Hyperlipidemia   . Sleep apnea   . Left knee DJD     Past Medical History:  Diagnosis Date  . Anxiety   . Arthritis    "was in my knees before OR; now in my hands" (05/05/2014)  . Bronchitis   . Chronic bronchitis ([place])    "used to get it q yr; not as much since septal repair in 2004"  . Depression   . Family history of bone cancer   . Family history of breast cancer   . Family history of carrier of hereditary disease   . Family history of CLL (chronic lymphoid leukemia)   . Family history of testicular cancer   . Fatty liver 06/30/2019  . GERD (gastroesophageal reflux disease)   . High triglycerides   . HSV infection   . Hyperlipidemia   . Hypertension   . Left knee DJD   . Obesity   . Primary localized osteoarthritis of right knee   . Restless leg syndrome   . Sleep apnea    mild; does not wear CPAP  . Type II diabetes mellitus ([place])   . Urgency of urination  and frequency takes Vesicare  . Urinary incontinence   . Walking pneumonia ~ 2013    Past Surgical History:  Procedure Laterality Date  . BREAST BIOPSY Left   . COLONOSCOPY    . HYSTEROSCOPY  11/94   submuous myomas  . KNEE ARTHROSCOPY Right ~ 2010  . KNEE ARTHROSCOPY W/ MENISCECTOMY Right 2010  . NASAL SEPTUM SURGERY    . REFRACTIVE SURGERY Bilateral ~ 2005  . TONSILLECTOMY  ~ 2004  . TOTAL KNEE ARTHROPLASTY Left 08/11/2013   Procedure: LEFT TOTAL KNEE ARTHROPLASTY;  Surgeon: Lorn Junes, MD;  Location: [facility];  Service: Orthopedics;  Laterality: Left;  . TOTAL KNEE ARTHROPLASTY Right 05/04/2014   Procedure: RIGHT TOTAL KNEE ARTHROPLASTY;  Surgeon: Lorn Junes, MD;  Location: [facility];  Service: Orthopedics;  Laterality: Right;  . UVULOPALATOPHARYNGOPLASTY, TONSILLECTOMY AND SEPTOPLASTY  2004  . VAGINAL HYSTERECTOMY  06/1993    Current Outpatient Medications  Medication Sig Dispense Refill  . Ascorbic Acid (VITAMIN C PO) Take by mouth daily.    . Continuous Blood Gluc  Receiver (FREESTYLE LIBRE 2 READER) DEVI 1 Device by Does not apply route as directed. 1 each 0  . Continuous Blood Gluc Sensor (FREESTYLE LIBRE 2 SENSOR) MISC 1 Device by Does not apply route as directed. 6 each 3  . diltiazem (CARDIZEM CD) 240 MG 24 hr capsule Take 1 capsule (240 mg total) by mouth daily. 90 capsule 3  . Exenatide ER (BYDUREON BCISE) 2 MG/0.85ML AUIJ Inject 2 mg into the skin once a week. 4 pen 3  . fenofibrate 160 MG tablet Take 1 tablet (160 mg total) by mouth daily. 90 tablet 3  . glipiZIDE (GLUCOTROL) 5 MG tablet Take 1.5 tablets (7.5 mg total) by mouth 2 (two) times daily before a meal. 225 tablet 3  . losartan-hydrochlorothiazide (HYZAAR) 100-25 MG tablet Take 1 tablet by mouth daily. 90 tablet 3  . Melatonin 1 MG TABS Take 1 mg by mouth at bedtime.    . metFORMIN (GLUCOPHAGE-XR) 500 MG 24 hr tablet Take 4 tablets by mouth once a day (Patient taking differently: Take 2 tablets by mouth twice daily.) 360 tablet 3  . Mirabegron (MYRBETRIQ PO) Take 1 capsule by mouth daily.    . Misc Natural Products (OSTEO BI-FLEX ADV JOINT SHIELD) TABS     . Multiple Vitamin (MULTIVITAMIN WITH MINERALS) TABS tablet Take 1 tablet by mouth daily.    Marland Kitchen omeprazole (PRILOSEC) 20 MG capsule TAKE 1 CAPSULE BY MOUTH  DAILY 30 TO 60 MINUTES  BEFORE BREAKFAST 90 capsule 3  . PARoxetine (PAXIL) 40 MG tablet TAKE 1 TABLET(40 MG) BY MOUTH EVERY MORNING 90 tablet 3  . pravastatin (PRAVACHOL) 40 MG tablet Take 1 tablet (40 mg total) by mouth daily. 90 tablet 3  . rOPINIRole (REQUIP) 2 MG tablet Take 2 tablets at bedtime 180 tablet 3   No current facility-administered medications for this visit.     ALLERGIES: Celebrex [celecoxib], Lisinopril, Meloxicam, Nsaids, Penicillins, Statins, and Lidocaine  Family History  Problem Relation Age of Onset  . Heart disease Mother   . Hypertension Mother   . Stroke Mother   . Breast cancer Mother 51  . Diabetes Mellitus II Father   . Cancer Father        CLL   . Hypertension Brother   . Testicular cancer Brother 63  . Breast cancer Paternal Grandmother        diagnosed 48s  . Breast cancer Maternal Grandmother  diagnosed 28s  . Breast cancer Paternal Aunt   . Bone cancer Maternal Grandfather 70  . Breast cancer Cousin        paternal first cousin, diagnosed 44s    Social History   Socioeconomic History  . Marital status: Widowed    Spouse name: Not on file  . Number of children: 2  . Years of education: 74  . Highest education level: Not on file  Occupational History  . Occupation: Intake Coordinator    Comment: administration  Tobacco Use  . Smoking status: Never Smoker  . Smokeless tobacco: Never Used  Vaping Use  . Vaping Use: Never used  Substance and Sexual Activity  . Alcohol use: Not Currently  . Drug use: No  . Sexual activity: Not Currently    Partners: Male    Birth control/protection: Surgical    Comment: [facility] 1994  Other Topics Concern  . Not on file  Social History Narrative   Currently single. Fun: Crafts, flea markets, antiquing, travel, reading   Denies religious beliefs that effect health care.    Caffeine: 2-3 drinks a day    Social Determinants of Health   Financial Resource Strain:   . Difficulty of Paying Living Expenses: Not on file  Food Insecurity:   . Worried About Charity fundraiser in the Last Year: Not on file  . Ran Out of Food in the Last Year: Not on file  Transportation Needs:   . Lack of Transportation (Medical): Not on file  . Lack of Transportation (Non-Medical): Not on file  Physical Activity:   . Days of Exercise per Week: Not on file  . Minutes of Exercise per Session: Not on file  Stress:   . Feeling of Stress : Not on file  Social Connections:   . Frequency of Communication with Friends and Family: Not on file  . Frequency of Social Gatherings with Friends and Family: Not on file  . Attends Religious Services: Not on file  . Active Member of Clubs or Organizations:  Not on file  . Attends Archivist Meetings: Not on file  . Marital Status: Not on file  Intimate Partner Violence:   . Fear of Current or Ex-Partner: Not on file  . Emotionally Abused: Not on file  . Physically Abused: Not on file  . Sexually Abused: Not on file    Review of Systems  All other systems reviewed and are negative.   PHYSICAL EXAMINATION:    BP 132/64 (Cuff Size: Large)   Pulse 80   Ht 5' 3.5" (1.613 m)   Wt 253 lb (114.8 kg)   LMP 08/14/1994 (Within Months)   BMI 44.11 kg/m     General appearance: alert, cooperative and appears stated age    Pelvic: External genitalia:  Right labia majora with 1.5 cm round fluctuant mass, coming to a head.  Labia with generalized induration of the skin.   Procedure - I and D of abscess.  Sterile prep with betadine.  Local 1% lidocaine - lot [id number], exp May 2024.  Scalpel used to create incision over abscess.  Pus drained and wound culture taken.  Wound explored.  No pockets of pus noted.  Area cleansed and sterile gauze placed.  No complications.  Minimal EBL.              Chaperone was present for exam.  ASSESSMENT  Right vulvar abscess with cellulitis.   PLAN  Wound culture.  Doxycycline 100 mg po  bid x 7 days. Call in not improving.   Fu prn.

## 2020-04-15 ENCOUNTER — Other Ambulatory Visit: Payer: Self-pay

## 2020-04-15 LAB — WOUND CULTURE

## 2020-04-15 MED ORDER — BYDUREON BCISE 2 MG/0.85ML ~~LOC~~ AUIJ: 2.0000 mg | AUTO-INJECTOR | SUBCUTANEOUS | 2 refills | Status: DC

## 2020-04-21 ENCOUNTER — Telehealth: Payer: Self-pay | Admitting: *Deleted

## 2020-04-21 ENCOUNTER — Encounter: Payer: Self-pay | Admitting: Obstetrics and Gynecology

## 2020-04-21 MED ORDER — ALPRAZOLAM 0.5 MG PO TABS: ORAL_TABLET | ORAL | 0 refills | Status: AC

## 2020-04-21 NOTE — Telephone Encounter (Signed)
See 04/21/20 telephone encounter.   Encounter closed.

## 2020-04-21 NOTE — Telephone Encounter (Signed)
Ok for Xanax 0.5 mg.  Sig:  Take one orally 30 - 60 minutes prior to procedure.  Disp:  2 RF:  none.   She will need a driver for her appointment.

## 2020-04-21 NOTE — Telephone Encounter (Signed)
Spoke with patient, advised per Dr. Quincy Simmonds.  Patient verbalizes understanding and is agreeable.  Patient is aware RX will need to be picked up from office, she will plan to pick up on 04/22/20.   Printed Rx to Dr. Quincy Simmonds for signature.

## 2020-04-21 NOTE — Telephone Encounter (Signed)
[place], [name] Dr. Quincy Simmonds, I am scheduled for my breast MRI next week. Last time I experienced some issues with anxiety during the procedure. Would you be able to send a Rx for that?  [facility] at [address], [place].   Diane Gregory   MRI scheduled for 04/29/20 Patient previously prescribed 02/20/18: Xanax 0.5 mg tab #2/0RF Take 1 tablet (0.5 mg total) by mouth once for 1 dose. Prior to procedure.  Rx pended and routed to Dr. Quincy Simmonds to review and sign.

## 2020-04-22 NOTE — Telephone Encounter (Signed)
Patient in office. Printed/Signed Rx for Xanax 0.05mg  given to patient.   Encounter closed.

## 2020-04-23 ENCOUNTER — Other Ambulatory Visit: Payer: Self-pay

## 2020-04-23 MED ORDER — METFORMIN HCL ER 500 MG PO TB24: ORAL_TABLET | ORAL | 1 refills | Status: AC

## 2020-04-26 ENCOUNTER — Encounter: Payer: Self-pay | Admitting: Internal Medicine

## 2020-04-27 ENCOUNTER — Other Ambulatory Visit: Payer: Self-pay | Admitting: Internal Medicine

## 2020-04-27 MED ORDER — ACCU-CHEK GUIDE VI STRP: 1.0000 | ORAL_STRIP | Freq: Two times a day (BID) | 12 refills | Status: DC

## 2020-04-27 MED ORDER — GLIPIZIDE 5 MG PO TABS: 10.0000 mg | ORAL_TABLET | Freq: Two times a day (BID) | ORAL | 3 refills | Status: AC

## 2020-04-27 MED ORDER — ACCU-CHEK GUIDE ME W/DEVICE KIT: 1.0000 | PACK | 0 refills | Status: DC

## 2020-04-27 MED ORDER — RYBELSUS 3 MG PO TABS: 3.0000 mg | ORAL_TABLET | Freq: Every day | ORAL | 6 refills | Status: DC

## 2020-04-27 NOTE — Telephone Encounter (Signed)
For some reason I was unable to respond directly to the pt, it looks like the encounter has been closed but please copy and past    Stop Bydureon Start Rybelsys 3 mg daily with breakfast ( to replace bydureon) Increase Glipizide to 2 tablets before Breakfast and 2 tablets before supper ( when you are ready for a refill will change to 10 mg tablet instead of 5 mg tablet 4.Accu-chek meter sent with extra strips. If this is not covered, then contact insurance to check on preferred meter for them

## 2020-04-29 ENCOUNTER — Ambulatory Visit
Admission: RE | Admit: 2020-04-29 | Discharge: 2020-04-29 | Disposition: A | Discharge status: Home / Self Care | Payer: Medicare Other | Source: Ambulatory Visit | Attending: Obstetrics and Gynecology | Admitting: Obstetrics and Gynecology

## 2020-04-29 DIAGNOSIS — Z1509 Genetic susceptibility to other malignant neoplasm: Secondary | ICD-10-CM

## 2020-04-29 DIAGNOSIS — Z9189 Other specified personal risk factors, not elsewhere classified: Secondary | ICD-10-CM

## 2020-04-29 DIAGNOSIS — Z803 Family history of malignant neoplasm of breast: Secondary | ICD-10-CM

## 2020-04-29 MED ORDER — GADOBUTROL 1 MMOL/ML IV SOLN
10.0000 mL | Freq: Once | INTRAVENOUS | Status: AC | PRN
  Administered 2020-04-29: 10 mL via INTRAVENOUS

## 2020-05-21 ENCOUNTER — Encounter: Payer: Self-pay | Admitting: Internal Medicine

## 2020-05-24 ENCOUNTER — Other Ambulatory Visit: Payer: Self-pay | Admitting: Internal Medicine

## 2020-06-16 ENCOUNTER — Telehealth: Payer: Self-pay | Admitting: Internal Medicine

## 2020-06-16 NOTE — Telephone Encounter (Signed)
Ok for routine refills for 3 mo only as per routine refill policy  But we cannot rx controlled substances across state lines (such as the xanax)

## 2020-06-16 NOTE — Telephone Encounter (Signed)
Sent to Dr. John to advise. 

## 2020-06-16 NOTE — Telephone Encounter (Signed)
Very sorry, but I dont feel comfortable with this  I would encourage pt to establish with a local MD and have her labs there as well, if she intends to stay

## 2020-06-16 NOTE — Telephone Encounter (Signed)
This patient has moved to [place] and has requested a virtual visit for her November 17th appointment. She would like to get her labs done the week before by a Provider in [place]  Please call the patient and advise them if this is okay  [phone]

## 2020-06-18 ENCOUNTER — Other Ambulatory Visit: Payer: Self-pay

## 2020-06-18 DIAGNOSIS — E1165 Type 2 diabetes mellitus with hyperglycemia: Secondary | ICD-10-CM

## 2020-06-18 DIAGNOSIS — K2100 Gastro-esophageal reflux disease with esophagitis, without bleeding: Secondary | ICD-10-CM

## 2020-06-18 DIAGNOSIS — G2581 Restless legs syndrome: Secondary | ICD-10-CM

## 2020-06-18 DIAGNOSIS — E782 Mixed hyperlipidemia: Secondary | ICD-10-CM

## 2020-06-18 MED ORDER — LOSARTAN POTASSIUM-HCTZ 100-25 MG PO TABS: 1.0000 | ORAL_TABLET | Freq: Every day | ORAL | 3 refills | Status: DC

## 2020-06-18 MED ORDER — PRAVASTATIN SODIUM 40 MG PO TABS: 40.0000 mg | ORAL_TABLET | Freq: Every day | ORAL | 3 refills | Status: DC

## 2020-06-18 MED ORDER — FENOFIBRATE 160 MG PO TABS
160.0000 mg | ORAL_TABLET | Freq: Every day | ORAL | 3 refills | Status: DC
Start: 2020-06-18 — End: 2020-06-18

## 2020-06-18 MED ORDER — DILTIAZEM HCL ER COATED BEADS 240 MG PO CP24: 240.0000 mg | ORAL_CAPSULE | Freq: Every day | ORAL | 3 refills | Status: DC

## 2020-06-18 MED ORDER — LOSARTAN POTASSIUM-HCTZ 100-25 MG PO TABS: 1.0000 | ORAL_TABLET | Freq: Every day | ORAL | 0 refills | Status: AC

## 2020-06-18 MED ORDER — PRAVASTATIN SODIUM 40 MG PO TABS: 40.0000 mg | ORAL_TABLET | Freq: Every day | ORAL | 0 refills | Status: DC

## 2020-06-18 MED ORDER — ROPINIROLE HCL 2 MG PO TABS: ORAL_TABLET | ORAL | 3 refills | Status: DC

## 2020-06-18 MED ORDER — FENOFIBRATE 160 MG PO TABS: 160.0000 mg | ORAL_TABLET | Freq: Every day | ORAL | 0 refills | Status: AC

## 2020-06-18 MED ORDER — OMEPRAZOLE 20 MG PO CPDR: DELAYED_RELEASE_CAPSULE | ORAL | 3 refills | Status: DC

## 2020-06-18 MED ORDER — ROPINIROLE HCL 2 MG PO TABS: ORAL_TABLET | ORAL | 0 refills | Status: AC

## 2020-06-18 MED ORDER — OMEPRAZOLE 20 MG PO CPDR: DELAYED_RELEASE_CAPSULE | ORAL | 0 refills | Status: AC

## 2020-06-18 MED ORDER — PAROXETINE HCL 40 MG PO TABS: ORAL_TABLET | ORAL | 3 refills | Status: DC

## 2020-06-18 MED ORDER — PAROXETINE HCL 40 MG PO TABS: ORAL_TABLET | ORAL | 0 refills | Status: AC

## 2020-06-18 NOTE — Telephone Encounter (Signed)
Called pt and was bale to LVM for pt to please call the clinic with the name of the pharmacy and the address.  **I was also able to inform pt via vm of Dr. Gwynn Burly note.

## 2020-06-21 ENCOUNTER — Other Ambulatory Visit: Payer: Self-pay | Admitting: Internal Medicine

## 2020-06-21 NOTE — Telephone Encounter (Signed)
Ok refill?  Patient had moved to [place], [place] and request refill at [facility] there. Patient stated that she is still coming to see Dr Kelton Pillar in [place] as scheduled.

## 2020-06-30 ENCOUNTER — Telehealth: Payer: Medicare Other | Admitting: Internal Medicine

## 2020-06-30 ENCOUNTER — Encounter: Payer: Self-pay | Admitting: Internal Medicine

## 2020-06-30 NOTE — Telephone Encounter (Signed)
   Patient made aware CPE can not be virtual Also patient states she has UTI symptoms again (painful, frequent urination). She is taking AZO over the counter. Requesting medications be called to [facility] [place], [place] - [address] AT [place]

## 2020-06-30 NOTE — Telephone Encounter (Signed)
Sent to Dr. John to advise. 

## 2020-06-30 NOTE — Telephone Encounter (Signed)
Very sorry, I am not comfortabe with treatment without evaluation of at least a urinalysis and even better would be a culture.  If the pt is in [place], I would see UC there, thanks

## 2020-06-30 NOTE — Telephone Encounter (Signed)
   Patient called back, attempted to tell her the recommendation from Dr Jenny Reichmann, patient disconnected call

## 2020-07-05 ENCOUNTER — Other Ambulatory Visit: Payer: Self-pay | Admitting: Internal Medicine

## 2020-07-05 MED ORDER — ACCU-CHEK GUIDE VI STRP
1.0000 | ORAL_STRIP | Freq: Two times a day (BID) | 5 refills | Status: DC
Start: 2020-07-05 — End: 2021-06-16

## 2020-07-14 NOTE — Telephone Encounter (Signed)
Ok to reschedule for uti symptoms only  But I still recommend best tx is to see local urgent care where they can check the urine testing which we cannot do here, and symptoms alone are not reliable

## 2020-07-19 ENCOUNTER — Encounter: Payer: Self-pay | Admitting: Internal Medicine

## 2020-07-19 ENCOUNTER — Telehealth: Payer: Medicare Other | Admitting: Internal Medicine

## 2020-07-19 DIAGNOSIS — R300 Dysuria: Secondary | ICD-10-CM | POA: Insufficient documentation

## 2020-07-19 NOTE — Progress Notes (Signed)
Patient ID: Diane Gregory, female   DOB: 1955/10/20, 64 y.o.   MRN: [id number]   As the video did not work (pt states never got a link), I called pt, who stated in the end she did not need to speak to [place] today, as she also was able to get a urology appt today, and did not cancel this one.   Cathlean Cower MD

## 2020-07-19 NOTE — Patient Instructions (Signed)
Pt not seen.

## 2020-07-19 NOTE — Assessment & Plan Note (Signed)
Pt not seen, prefers to be seen per urology, has appt today

## 2020-07-22 ENCOUNTER — Telehealth: Payer: Self-pay | Admitting: Internal Medicine

## 2020-07-22 NOTE — Telephone Encounter (Signed)
Patient advised that she is on a 2 hour delay in [place]

## 2020-07-22 NOTE — Telephone Encounter (Signed)
Patient called to check the status of a CMN from the [place].  It is for lancets.  She is out and the form needs to be returned before she can get her RX filled and covered by [facility]

## 2020-08-11 ENCOUNTER — Other Ambulatory Visit: Payer: Self-pay

## 2020-08-11 DIAGNOSIS — E782 Mixed hyperlipidemia: Secondary | ICD-10-CM

## 2020-08-11 DIAGNOSIS — E1165 Type 2 diabetes mellitus with hyperglycemia: Secondary | ICD-10-CM

## 2020-08-11 MED ORDER — PRAVASTATIN SODIUM 40 MG PO TABS: 40.0000 mg | ORAL_TABLET | Freq: Every day | ORAL | 0 refills | Status: DC

## 2020-08-16 ENCOUNTER — Telehealth: Payer: Self-pay | Admitting: Internal Medicine

## 2020-08-16 NOTE — Telephone Encounter (Signed)
Please let the pt know that her insurance does not cover Accu chek guide.   I suggest she reaches out to her insurance and see what they cover so we can write prescription for her.   Other option, is get the generic [facility] meter (ReliOn) and use it instead    Thanks  Abby Raelyn Mora, MD  [facility] Endocrin[facility]  [facility] [address] [place], [place] [address] Phone: [phone] FAX: [phone]

## 2020-08-16 NOTE — Telephone Encounter (Signed)
Message left for patient to return my call.  

## 2020-08-17 ENCOUNTER — Telehealth: Payer: Self-pay | Admitting: Internal Medicine

## 2020-08-17 NOTE — Telephone Encounter (Signed)
Noted. Will call back later and addressed in the telephone encounter from Dr [facility] on 08/17/2019 for patient

## 2020-08-17 NOTE — Telephone Encounter (Signed)
Spoken to patient and notified Dr Shamleffer's comments. Verbalized understanding.   

## 2020-08-17 NOTE — Telephone Encounter (Signed)
Patient returned Chan's call. Patient requests to be called at ph# [phone] (Patient will be unavailable from 11:45 am to 12:45 a.m. today).

## 2020-08-24 ENCOUNTER — Other Ambulatory Visit: Payer: Self-pay | Admitting: Internal Medicine

## 2020-08-31 ENCOUNTER — Other Ambulatory Visit: Payer: Self-pay | Admitting: Internal Medicine

## 2020-09-03 ENCOUNTER — Other Ambulatory Visit: Payer: Self-pay

## 2020-09-03 MED ORDER — DILTIAZEM HCL ER COATED BEADS 240 MG PO CP24: 240.0000 mg | ORAL_CAPSULE | Freq: Every day | ORAL | 3 refills | Status: AC

## 2020-10-28 ENCOUNTER — Ambulatory Visit: Payer: Medicare Other | Admitting: Internal Medicine

## 2021-01-19 ENCOUNTER — Other Ambulatory Visit: Payer: Self-pay

## 2021-04-26 ENCOUNTER — Ambulatory Visit: Payer: Medicare Other | Admitting: Adult Health

## 2021-05-12 ENCOUNTER — Telehealth: Payer: Self-pay

## 2021-05-12 NOTE — Addendum Note (Signed)
Addended by: Terence Lux A on: 05/12/2021 04:36 PM   Modules accepted: Orders

## 2021-06-16 ENCOUNTER — Other Ambulatory Visit: Payer: Self-pay | Admitting: Internal Medicine

## 2021-06-30 ENCOUNTER — Other Ambulatory Visit (HOSPITAL_COMMUNITY): Payer: Self-pay

## 2021-07-15 ENCOUNTER — Other Ambulatory Visit: Payer: Self-pay | Admitting: Internal Medicine

## 2021-07-15 DIAGNOSIS — E782 Mixed hyperlipidemia: Secondary | ICD-10-CM

## 2021-07-15 DIAGNOSIS — E1165 Type 2 diabetes mellitus with hyperglycemia: Secondary | ICD-10-CM

## 2021-07-15 NOTE — Telephone Encounter (Signed)
Please refill as per office routine med refill policy (all routine meds to be refilled for 3 mo or monthly (per pt preference) up to one year from last visit, then month to month grace period for 3 mo, then further med refills will have to be denied) ? ?

## 2021-07-18 ENCOUNTER — Encounter: Payer: Self-pay | Admitting: *Deleted

## 2021-11-09 IMAGING — MG DIGITAL SCREENING BILAT W/ TOMO W/ CAD
6 of 10 series · 6 of 30 positions shown · non-contrast
Comparison: Previous exam(s).

ACR Breast Density Category a: The breast tissue is almost entirely
fatty.

CLINICAL DATA: Screening.

EXAM:
DIGITAL SCREENING BILATERAL MAMMOGRAM WITH TOMO AND CAD

[L MLO synth-2D]
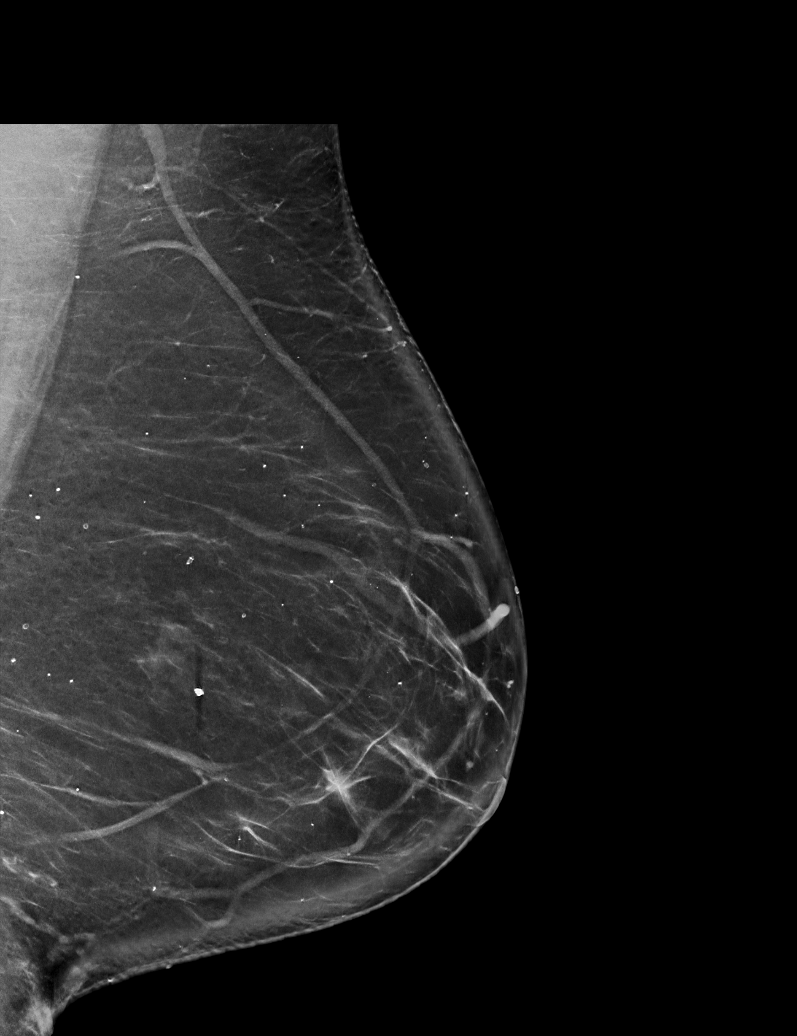

[R MLO synth-2D (1 of 2)]
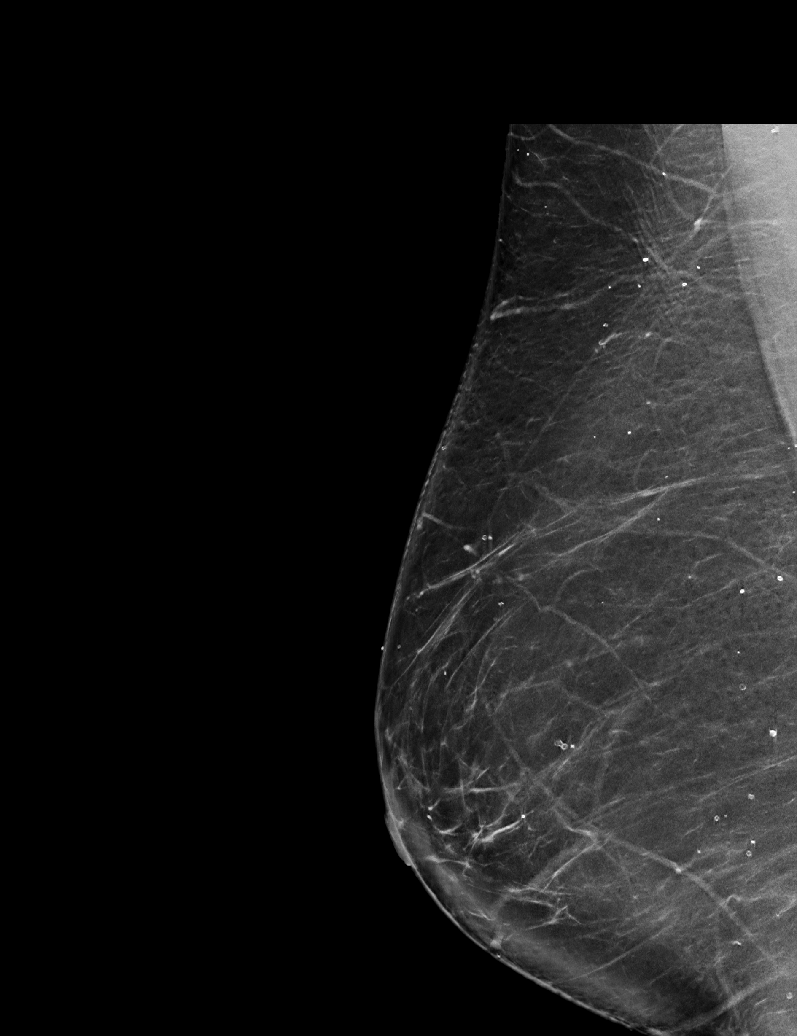

[L CC synth-2D]
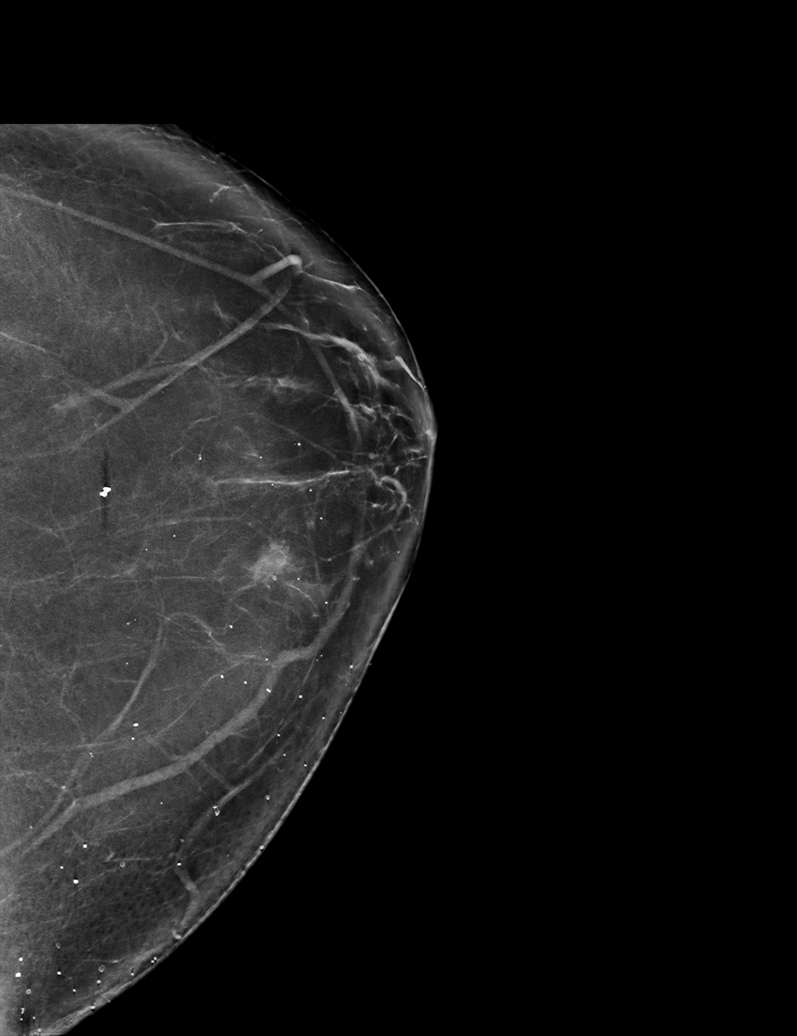

[R MLO synth-2D (2 of 2)]
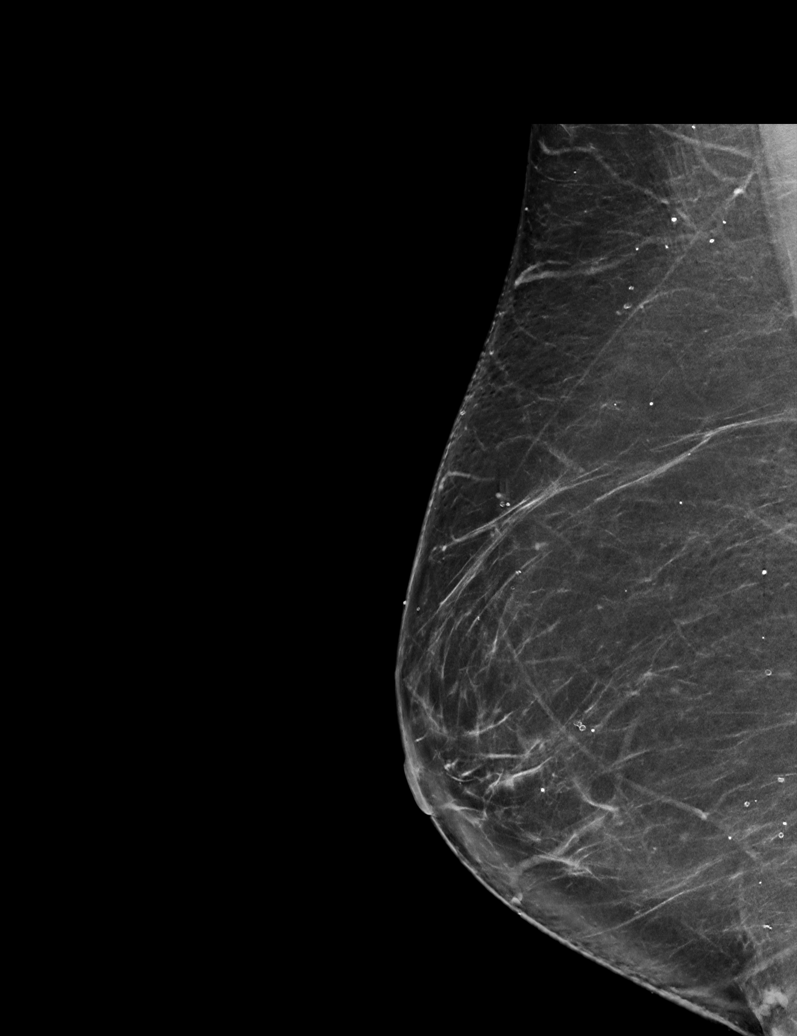

[R CC synth-2D]
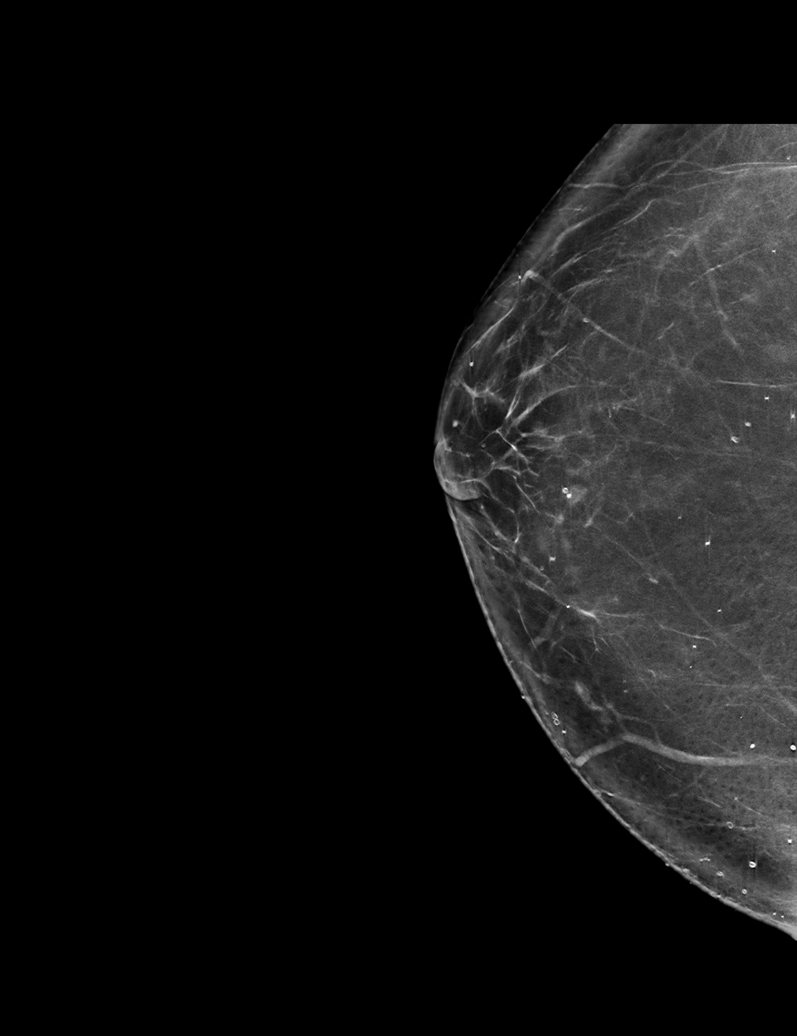

[L MLO tomo · tomo slice 43/85.0]
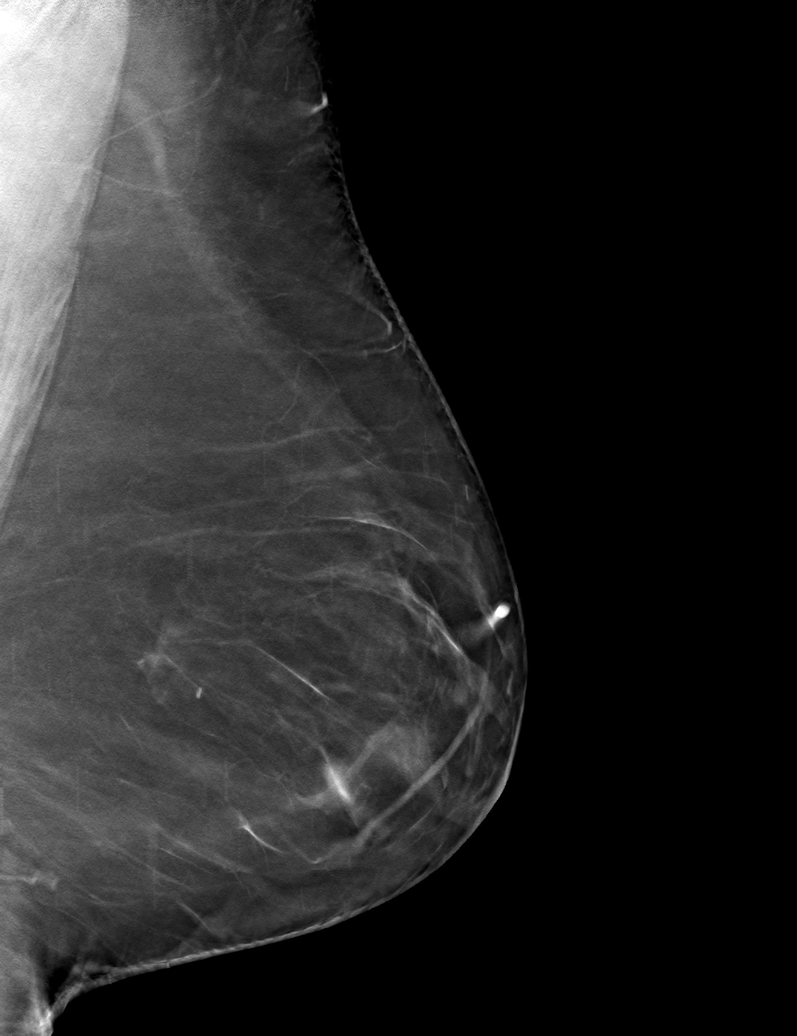

[6 of 30 positions shown; findings below may reference images not displayed]

FINDINGS: There are no findings suspicious for malignancy. Images were
processed with CAD.
IMPRESSION: No mammographic evidence of malignancy. A result letter of this
screening mammogram will be mailed directly to the patient.

RECOMMENDATION:
Screening mammogram in one year. (Code:[id number]-[id number])

BI-RADS CATEGORY  1: Negative.

## 2022-05-09 ENCOUNTER — Other Ambulatory Visit: Payer: Self-pay | Admitting: Internal Medicine

## 2022-05-09 DIAGNOSIS — E1165 Type 2 diabetes mellitus with hyperglycemia: Secondary | ICD-10-CM

## 2022-05-09 DIAGNOSIS — E782 Mixed hyperlipidemia: Secondary | ICD-10-CM

## 2022-05-09 NOTE — Telephone Encounter (Signed)
Please refill as per office routine med refill policy (all routine meds to be refilled for 3 mo or monthly (per pt preference) up to one year from last visit, then month to month grace period for 3 mo, then further med refills will have to be denied) ? ?

## 2022-06-24 ENCOUNTER — Other Ambulatory Visit: Payer: Self-pay | Admitting: Internal Medicine

## 2023-03-07 ENCOUNTER — Telehealth: Payer: Self-pay | Admitting: *Deleted

## 2023-03-07 NOTE — Telephone Encounter (Signed)
error 

## 2023-03-23 NOTE — Telephone Encounter (Signed)
Pcp removed

## 2023-04-26 ENCOUNTER — Telehealth: Payer: Self-pay | Admitting: *Deleted

## 2023-04-26 NOTE — Telephone Encounter (Signed)
Pt last note faxed to [facility] @ [phone]
